# Patient Record
Sex: Female | Born: 1945 | Race: White | Hispanic: No | Smoking: Former smoker
Health system: Southern US, Community
[De-identification: ages and names within clinical notes are randomized; demographics above are authoritative.]

## PROBLEM LIST (undated history)

## (undated) DIAGNOSIS — E785 Hyperlipidemia, unspecified: Secondary | ICD-10-CM

## (undated) DIAGNOSIS — I208 Other forms of angina pectoris: Secondary | ICD-10-CM

## (undated) DIAGNOSIS — I10 Essential (primary) hypertension: Secondary | ICD-10-CM

## (undated) DIAGNOSIS — Z9089 Acquired absence of other organs: Secondary | ICD-10-CM

## (undated) DIAGNOSIS — I639 Cerebral infarction, unspecified: Secondary | ICD-10-CM

## (undated) DIAGNOSIS — M1990 Unspecified osteoarthritis, unspecified site: Secondary | ICD-10-CM

## (undated) DIAGNOSIS — J449 Chronic obstructive pulmonary disease, unspecified: Secondary | ICD-10-CM

## (undated) DIAGNOSIS — I252 Old myocardial infarction: Secondary | ICD-10-CM

## (undated) DIAGNOSIS — D249 Benign neoplasm of unspecified breast: Secondary | ICD-10-CM

## (undated) DIAGNOSIS — Z9079 Acquired absence of other genital organ(s): Secondary | ICD-10-CM

## (undated) DIAGNOSIS — K219 Gastro-esophageal reflux disease without esophagitis: Secondary | ICD-10-CM

## (undated) DIAGNOSIS — I2510 Atherosclerotic heart disease of native coronary artery without angina pectoris: Secondary | ICD-10-CM

## (undated) DIAGNOSIS — I2089 Other forms of angina pectoris: Secondary | ICD-10-CM

## (undated) HISTORY — DX: Other forms of angina pectoris: I20.89

## (undated) HISTORY — DX: Atherosclerotic heart disease of native coronary artery without angina pectoris: I25.10

## (undated) HISTORY — DX: Cerebral infarction, unspecified: I63.9

## (undated) HISTORY — PX: BREAST LUMPECTOMY: SHX2

## (undated) HISTORY — DX: Other forms of angina pectoris: I20.8

## (undated) HISTORY — DX: Old myocardial infarction: I25.2

## (undated) HISTORY — DX: Chronic obstructive pulmonary disease, unspecified: J44.9

## (undated) HISTORY — DX: Benign neoplasm of unspecified breast: D24.9

## (undated) HISTORY — DX: Acquired absence of other genital organ(s): Z90.79

## (undated) HISTORY — DX: Gastro-esophageal reflux disease without esophagitis: K21.9

## (undated) HISTORY — DX: Essential (primary) hypertension: I10

## (undated) HISTORY — PX: POLYPECTOMY: SHX149

## (undated) HISTORY — DX: Hyperlipidemia, unspecified: E78.5

## (undated) HISTORY — DX: Unspecified osteoarthritis, unspecified site: M19.90

## (undated) HISTORY — PX: COLONOSCOPY: SHX174

## (undated) HISTORY — PX: CARPAL TUNNEL RELEASE: SHX101

## (undated) HISTORY — DX: Acquired absence of other organs: Z90.89

## (undated) NOTE — *Deleted (*Deleted)
   Office Visit   Patient ID: MATALYN NAWAZ, female    DOB: Jul 23, 1945, 89 y.o.   MRN: [id number]  Subjective:  CC: ***  HPI 48 y.o. presents today for ***      ACTIVE MEDICATIONS   Current Outpatient Medications on File Prior to Visit  Medication Sig Dispense Refill  . amLODipine (NORVASC) 5 MG tablet TAKE 1 TABLET EACH DAY. 90 tablet 1  . aspirin 81 MG EC tablet Take 1 tablet (81 mg total) by mouth daily. 90 tablet 3  . ASPIRIN-ACETAMINOPHEN-CAFFEINE PO Take by mouth as needed.    Marland Kitchen atorvastatin (LIPITOR) 40 MG tablet Take 0.5 tablets (20 mg total) by mouth daily. 90 tablet 2  . atorvastatin (LIPITOR) 40 MG tablet Take 0.5 tablets (20 mg total) by mouth daily. 45 tablet 3  . B Complex-Biotin-FA (SUPER B-COMPLEX PO) Take 1 tablet by mouth daily.      . calcium-vitamin D (OSCAL 500/200 D-3) 500-200 MG-UNIT tablet Take 2 tablets by mouth daily with breakfast. 90 tablet 1  . carvedilol (COREG) 25 MG tablet Take 1 tablet (25 mg total) by mouth 2 (two) times daily with a meal. 180 tablet 1  . hydrochlorothiazide (HYDRODIURIL) 25 MG tablet TAKE 1 TABLET ONCE DAILY. 90 tablet 3  . nitroGLYCERIN (NITROSTAT) 0.4 MG SL tablet DISSOLVE 1 TABLET UNDER TONGUE AS NEEDED FOR CHEST PAIN,MAY REPEAT IN5 MINUTES FOR 3 DOSES. 25 tablet 1  . omeprazole (PRILOSEC) 40 MG capsule TAKE (1) CAPSULE TWICE DAILY. 180 capsule 0  . senna-docusate (SENNALAX-S) 8.6-50 MG tablet Take 2 tablets by mouth daily as needed (for constipation). 60 tablet 5  . [DISCONTINUED] omeprazole (PRILOSEC) 40 MG capsule Take 1 capsule (40 mg total) by mouth 2 (two) times daily. TAKE (1) CAPSULE DAILY. 180 capsule 1  . [DISCONTINUED] omeprazole (PRILOSEC) 40 MG capsule TAKE (1) CAPSULE TWICE DAILY. 180 capsule 0   No current facility-administered medications on file prior to visit.    ROS  Review of Systems  Objective:   LMP 06/20/1986  Wt Readings from Last 3 Encounters:  07/09/19 158 lb (71.7 kg)  03/25/19 157 lb  12.8 oz (71.6 kg)  11/23/18 156 lb 11.2 oz (71.1 kg)   BP Readings from Last 3 Encounters:  07/09/19 (!) 147/86  03/25/19 (!) 142/73  11/23/18 (!) 137/97   Physical Exam  Health Maintenance:   Health Maintenance  Topic Date Due  . Hepatitis C Screening  Never done  . COVID-19 Vaccine (1) Never done  . INFLUENZA VACCINE  01/19/2020  . COLONOSCOPY  02/04/2020  . MAMMOGRAM  04/07/2021  . TETANUS/TDAP  11/15/2021  . DEXA SCAN  Completed  . PNA vac Low Risk Adult  Completed     Assessment & Plan:   Problem List Items Addressed This Visit    None        Pt discussed with ***  Elige Radon, MD Internal Medicine Resident PGY-2 [facility] Pager: [phone] 02/28/2020 10:56 AM

---
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
## 1986-06-20 HISTORY — PX: ABDOMINAL HYSTERECTOMY: SHX81

## 1986-06-20 HISTORY — PX: BREAST EXCISIONAL BIOPSY: SUR124

## 1989-06-20 HISTORY — PX: SHOULDER SURGERY: SHX246

## 1995-06-21 HISTORY — PX: BLADDER SUSPENSION: SHX72

## 2000-12-09 ENCOUNTER — Emergency Department (HOSPITAL_COMMUNITY): Admission: EM | Admit: 2000-12-09 | Discharge: 2000-12-10 | Payer: Self-pay | Admitting: Emergency Medicine

## 2000-12-14 ENCOUNTER — Emergency Department (HOSPITAL_COMMUNITY): Admission: EM | Admit: 2000-12-14 | Discharge: 2000-12-14 | Payer: Self-pay | Admitting: Emergency Medicine

## 2002-04-09 ENCOUNTER — Emergency Department (HOSPITAL_COMMUNITY): Admission: EM | Admit: 2002-04-09 | Discharge: 2002-04-09 | Payer: Self-pay | Admitting: Emergency Medicine

## 2002-05-09 ENCOUNTER — Emergency Department (HOSPITAL_COMMUNITY): Admission: EM | Admit: 2002-05-09 | Discharge: 2002-05-09 | Payer: Self-pay | Admitting: Emergency Medicine

## 2002-10-23 ENCOUNTER — Emergency Department (HOSPITAL_COMMUNITY): Admission: EM | Admit: 2002-10-23 | Discharge: 2002-10-23 | Payer: Self-pay | Admitting: Emergency Medicine

## 2002-10-24 ENCOUNTER — Encounter: Payer: Self-pay | Admitting: Emergency Medicine

## 2002-10-24 ENCOUNTER — Emergency Department (HOSPITAL_COMMUNITY): Admission: EM | Admit: 2002-10-24 | Discharge: 2002-10-24 | Payer: Self-pay | Admitting: Emergency Medicine

## 2003-11-25 ENCOUNTER — Ambulatory Visit (HOSPITAL_COMMUNITY): Admission: RE | Admit: 2003-11-25 | Discharge: 2003-11-25 | Payer: Self-pay | Admitting: Family Medicine

## 2004-03-26 ENCOUNTER — Ambulatory Visit: Payer: Self-pay | Admitting: Family Medicine

## 2004-04-19 ENCOUNTER — Ambulatory Visit: Payer: Self-pay | Admitting: *Deleted

## 2004-04-19 ENCOUNTER — Ambulatory Visit: Payer: Self-pay | Admitting: Family Medicine

## 2004-08-09 ENCOUNTER — Ambulatory Visit (HOSPITAL_COMMUNITY): Admission: RE | Admit: 2004-08-09 | Discharge: 2004-08-09 | Payer: Self-pay | Admitting: Gastroenterology

## 2004-08-09 ENCOUNTER — Encounter: Payer: Self-pay | Admitting: Gastroenterology

## 2004-08-09 LAB — HM COLONOSCOPY

## 2005-06-20 DIAGNOSIS — I2510 Atherosclerotic heart disease of native coronary artery without angina pectoris: Secondary | ICD-10-CM

## 2005-06-20 HISTORY — DX: Atherosclerotic heart disease of native coronary artery without angina pectoris: I25.10

## 2005-06-20 HISTORY — PX: PTCA: SHX146

## 2005-06-27 ENCOUNTER — Ambulatory Visit: Payer: Self-pay | Admitting: Hospitalist

## 2005-07-11 ENCOUNTER — Ambulatory Visit: Payer: Self-pay | Admitting: Internal Medicine

## 2005-09-19 ENCOUNTER — Ambulatory Visit: Payer: Self-pay | Admitting: Hospitalist

## 2005-10-04 ENCOUNTER — Ambulatory Visit: Payer: Self-pay | Admitting: Gastroenterology

## 2005-10-21 ENCOUNTER — Ambulatory Visit: Payer: Self-pay | Admitting: Gastroenterology

## 2005-12-15 ENCOUNTER — Ambulatory Visit: Payer: Self-pay | Admitting: Internal Medicine

## 2006-02-15 ENCOUNTER — Ambulatory Visit: Payer: Self-pay | Admitting: Hospitalist

## 2006-02-23 ENCOUNTER — Inpatient Hospital Stay (HOSPITAL_COMMUNITY): Admission: AD | Admit: 2006-02-23 | Discharge: 2006-02-25 | Payer: Self-pay | Admitting: Internal Medicine

## 2006-02-23 ENCOUNTER — Ambulatory Visit: Payer: Self-pay | Admitting: Hospitalist

## 2006-02-24 ENCOUNTER — Ambulatory Visit: Payer: Self-pay | Admitting: Cardiology

## 2006-02-24 ENCOUNTER — Encounter: Payer: Self-pay | Admitting: Cardiology

## 2006-03-17 ENCOUNTER — Ambulatory Visit: Payer: Self-pay | Admitting: *Deleted

## 2006-03-20 ENCOUNTER — Ambulatory Visit (HOSPITAL_COMMUNITY): Admission: RE | Admit: 2006-03-20 | Discharge: 2006-03-20 | Payer: Self-pay | Admitting: Internal Medicine

## 2006-03-28 ENCOUNTER — Encounter: Admission: RE | Admit: 2006-03-28 | Discharge: 2006-03-28 | Payer: Self-pay | Admitting: Internal Medicine

## 2006-03-31 ENCOUNTER — Ambulatory Visit: Payer: Self-pay | Admitting: Internal Medicine

## 2006-03-31 ENCOUNTER — Encounter (INDEPENDENT_AMBULATORY_CARE_PROVIDER_SITE_OTHER): Payer: Self-pay | Admitting: Ophthalmology

## 2006-03-31 LAB — CONVERTED CEMR LAB
Cholesterol: 162 mg/dL (ref 0–200)
HDL: 40 mg/dL (ref >39)
LDL Cholesterol: 80 mg/dL (ref 0–99)
Total CHOL/HDL Ratio: 4.1
Triglycerides: 209 mg/dL — ABNORMAL HIGH (ref <150)
VLDL: 42 mg/dL — ABNORMAL HIGH (ref 0–40)

## 2006-05-10 ENCOUNTER — Emergency Department (HOSPITAL_COMMUNITY): Admission: EM | Admit: 2006-05-10 | Discharge: 2006-05-10 | Payer: Self-pay | Admitting: Emergency Medicine

## 2006-05-16 ENCOUNTER — Ambulatory Visit (HOSPITAL_COMMUNITY): Admission: RE | Admit: 2006-05-16 | Discharge: 2006-05-16 | Payer: Self-pay | Admitting: Internal Medicine

## 2006-05-16 ENCOUNTER — Encounter (INDEPENDENT_AMBULATORY_CARE_PROVIDER_SITE_OTHER): Payer: Self-pay | Admitting: *Deleted

## 2006-05-16 ENCOUNTER — Ambulatory Visit: Payer: Self-pay | Admitting: Internal Medicine

## 2006-05-16 LAB — CONVERTED CEMR LAB
ALT: 17 units/L (ref 0–35)
AST: 25 units/L (ref 0–37)
Albumin: 3.5 g/dL (ref 3.5–5.2)
Alkaline Phosphatase: 54 units/L (ref 39–117)
BUN: 14 mg/dL (ref 6–23)
Basophils Absolute: 0 10*3/uL (ref 0.0–0.1)
Basophils Relative: 1 % (ref 0–1)
Bilirubin Urine: NEGATIVE
CK-MB: 1.4 ng/mL (ref 0.3–4.0)
CO2: 28 meq/L (ref 19–32)
Calcium: 9.5 mg/dL (ref 8.4–10.5)
Chloride: 104 meq/L (ref 96–112)
Creatinine, Ser: 0.8 mg/dL (ref 0.40–1.20)
Eosinophils Relative: 2 % (ref 0–4)
Glucose, Bld: 94 mg/dL (ref 70–99)
HCT: 40.4 % (ref 34.4–43.3)
Hemoglobin, Urine: NEGATIVE
Hemoglobin: 13.7 g/dL (ref 11.7–14.8)
Ketones, ur: NEGATIVE mg/dL
Lipase: 26 units/L (ref 11–59)
Lymphocytes Relative: 29 % (ref 15–43)
Lymphs Abs: 2.2 10*3/uL (ref 0.8–3.1)
MCHC: 33.9 g/dL (ref 33.1–35.4)
MCV: 84.3 fL (ref 78.8–100.0)
Monocytes Absolute: 0.6 10*3/uL (ref 0.2–0.7)
Monocytes Relative: 7 % (ref 3–11)
Neutro Abs: 4.7 10*3/uL (ref 1.8–6.8)
Neutrophils Relative %: 61 % (ref 47–77)
Nitrite: NEGATIVE
Platelets: 242 10*3/uL (ref 152–374)
Potassium: 4.3 meq/L (ref 3.5–5.3)
Protein, ur: NEGATIVE mg/dL
RBC: 4.8 M/uL (ref 3.79–4.96)
RDW: 13.8 % (ref 11.5–15.3)
Sodium: 137 meq/L (ref 135–145)
Specific Gravity, Urine: 1.014 (ref 1.005–1.03)
Total Bilirubin: 0.6 mg/dL (ref 0.3–1.2)
Total CK: 70 units/L (ref 7–177)
Total Protein: 6.8 g/dL (ref 6.0–8.3)
Urine Glucose: NEGATIVE mg/dL
Urobilinogen, UA: 0.2 (ref 0.0–1.0)
WBC: 7.7 10*3/uL (ref 3.7–10.0)
pH: 6 (ref 5.0–8.0)

## 2006-05-17 ENCOUNTER — Encounter (INDEPENDENT_AMBULATORY_CARE_PROVIDER_SITE_OTHER): Payer: Self-pay | Admitting: *Deleted

## 2006-05-19 ENCOUNTER — Ambulatory Visit (HOSPITAL_COMMUNITY): Admission: RE | Admit: 2006-05-19 | Discharge: 2006-05-19 | Payer: Self-pay | Admitting: Internal Medicine

## 2006-05-22 DIAGNOSIS — K219 Gastro-esophageal reflux disease without esophagitis: Secondary | ICD-10-CM | POA: Insufficient documentation

## 2006-05-22 DIAGNOSIS — K649 Unspecified hemorrhoids: Secondary | ICD-10-CM | POA: Insufficient documentation

## 2006-05-22 DIAGNOSIS — F17200 Nicotine dependence, unspecified, uncomplicated: Secondary | ICD-10-CM | POA: Insufficient documentation

## 2006-05-22 DIAGNOSIS — Z9189 Other specified personal risk factors, not elsewhere classified: Secondary | ICD-10-CM | POA: Insufficient documentation

## 2006-05-22 DIAGNOSIS — K5900 Constipation, unspecified: Secondary | ICD-10-CM | POA: Insufficient documentation

## 2006-05-22 DIAGNOSIS — E785 Hyperlipidemia, unspecified: Secondary | ICD-10-CM | POA: Insufficient documentation

## 2006-05-22 DIAGNOSIS — I10 Essential (primary) hypertension: Secondary | ICD-10-CM | POA: Insufficient documentation

## 2006-05-25 ENCOUNTER — Ambulatory Visit: Payer: Self-pay | Admitting: Internal Medicine

## 2006-05-25 ENCOUNTER — Encounter (INDEPENDENT_AMBULATORY_CARE_PROVIDER_SITE_OTHER): Payer: Self-pay | Admitting: Internal Medicine

## 2006-05-25 LAB — CONVERTED CEMR LAB
BUN: 21 mg/dL (ref 6–23)
CO2: 28 meq/L (ref 19–32)
Calcium: 9.4 mg/dL (ref 8.4–10.5)
Chloride: 104 meq/L (ref 96–112)
Creatinine, Ser: 0.77 mg/dL (ref 0.40–1.20)
Glucose, Bld: 89 mg/dL (ref 70–99)
Potassium: 4.2 meq/L (ref 3.5–5.3)
Sodium: 142 meq/L (ref 135–145)
TSH: 1.573 microintl units/mL (ref 0.350–5.50)

## 2006-06-20 DIAGNOSIS — D249 Benign neoplasm of unspecified breast: Secondary | ICD-10-CM

## 2006-06-20 HISTORY — DX: Benign neoplasm of unspecified breast: D24.9

## 2006-06-20 HISTORY — PX: OTHER SURGICAL HISTORY: SHX169

## 2006-06-28 ENCOUNTER — Encounter (INDEPENDENT_AMBULATORY_CARE_PROVIDER_SITE_OTHER): Payer: Self-pay | Admitting: Internal Medicine

## 2006-06-28 ENCOUNTER — Ambulatory Visit: Payer: Self-pay | Admitting: Hospitalist

## 2006-06-28 LAB — CONVERTED CEMR LAB
ALT: 16 units/L (ref 0–35)
AST: 25 units/L (ref 0–37)
Albumin: 4.1 g/dL (ref 3.5–5.2)
Alkaline Phosphatase: 60 units/L (ref 39–117)
BUN: 19 mg/dL (ref 6–23)
CO2: 29 meq/L (ref 19–32)
Calcium: 9.8 mg/dL (ref 8.4–10.5)
Chloride: 103 meq/L (ref 96–112)
Creatinine, Ser: 0.76 mg/dL (ref 0.40–1.20)
Glucose, Bld: 87 mg/dL (ref 70–99)
Potassium: 4.4 meq/L (ref 3.5–5.3)
Sodium: 141 meq/L (ref 135–145)
Total Bilirubin: 0.6 mg/dL (ref 0.3–1.2)
Total Protein: 7.6 g/dL (ref 6.0–8.3)

## 2006-07-03 ENCOUNTER — Ambulatory Visit: Payer: Self-pay | Admitting: Gastroenterology

## 2006-07-10 ENCOUNTER — Ambulatory Visit: Payer: Self-pay | Admitting: Internal Medicine

## 2006-07-10 DIAGNOSIS — N6029 Fibroadenosis of unspecified breast: Secondary | ICD-10-CM | POA: Insufficient documentation

## 2006-07-10 DIAGNOSIS — I25811 Atherosclerosis of native coronary artery of transplanted heart without angina pectoris: Secondary | ICD-10-CM | POA: Insufficient documentation

## 2006-07-10 DIAGNOSIS — Z9079 Acquired absence of other genital organ(s): Secondary | ICD-10-CM | POA: Insufficient documentation

## 2006-07-10 DIAGNOSIS — K589 Irritable bowel syndrome without diarrhea: Secondary | ICD-10-CM | POA: Insufficient documentation

## 2006-07-10 DIAGNOSIS — Z9089 Acquired absence of other organs: Secondary | ICD-10-CM | POA: Insufficient documentation

## 2006-07-10 DIAGNOSIS — I208 Other forms of angina pectoris: Secondary | ICD-10-CM | POA: Insufficient documentation

## 2006-07-10 HISTORY — DX: Acquired absence of other organs: Z90.89

## 2006-07-10 HISTORY — DX: Acquired absence of other genital organ(s): Z90.79

## 2006-07-25 ENCOUNTER — Telehealth (INDEPENDENT_AMBULATORY_CARE_PROVIDER_SITE_OTHER): Payer: Self-pay | Admitting: *Deleted

## 2006-08-02 ENCOUNTER — Ambulatory Visit (HOSPITAL_COMMUNITY): Admission: RE | Admit: 2006-08-02 | Discharge: 2006-08-02 | Payer: Self-pay | Admitting: General Surgery

## 2006-08-02 ENCOUNTER — Encounter (INDEPENDENT_AMBULATORY_CARE_PROVIDER_SITE_OTHER): Payer: Self-pay | Admitting: Specialist

## 2006-09-22 ENCOUNTER — Ambulatory Visit: Payer: Self-pay | Admitting: *Deleted

## 2006-09-23 DIAGNOSIS — G43909 Migraine, unspecified, not intractable, without status migrainosus: Secondary | ICD-10-CM | POA: Insufficient documentation

## 2006-09-27 ENCOUNTER — Encounter: Admission: RE | Admit: 2006-09-27 | Discharge: 2006-09-27 | Payer: Self-pay | Admitting: Internal Medicine

## 2006-10-23 ENCOUNTER — Encounter (INDEPENDENT_AMBULATORY_CARE_PROVIDER_SITE_OTHER): Payer: Self-pay | Admitting: Internal Medicine

## 2006-10-23 ENCOUNTER — Ambulatory Visit: Payer: Self-pay | Admitting: *Deleted

## 2006-10-23 LAB — CONVERTED CEMR LAB: Pap Smear: NORMAL

## 2006-10-26 ENCOUNTER — Encounter (INDEPENDENT_AMBULATORY_CARE_PROVIDER_SITE_OTHER): Payer: Self-pay | Admitting: Internal Medicine

## 2007-01-07 HISTORY — PX: RECTAL SURGERY: SHX760

## 2007-01-10 ENCOUNTER — Encounter (INDEPENDENT_AMBULATORY_CARE_PROVIDER_SITE_OTHER): Payer: Self-pay | Admitting: General Surgery

## 2007-01-10 ENCOUNTER — Ambulatory Visit (HOSPITAL_COMMUNITY): Admission: RE | Admit: 2007-01-10 | Discharge: 2007-01-10 | Payer: Self-pay | Admitting: General Surgery

## 2007-03-15 ENCOUNTER — Ambulatory Visit: Payer: Self-pay | Admitting: Internal Medicine

## 2007-03-15 ENCOUNTER — Encounter (INDEPENDENT_AMBULATORY_CARE_PROVIDER_SITE_OTHER): Payer: Self-pay | Admitting: Internal Medicine

## 2007-03-15 DIAGNOSIS — R5383 Other fatigue: Secondary | ICD-10-CM

## 2007-03-15 DIAGNOSIS — R5381 Other malaise: Secondary | ICD-10-CM | POA: Insufficient documentation

## 2007-03-16 LAB — CONVERTED CEMR LAB
ALT: 10 units/L (ref 0–35)
AST: 19 units/L (ref 0–37)
Albumin: 4.1 g/dL (ref 3.5–5.2)
Alkaline Phosphatase: 63 units/L (ref 39–117)
BUN: 17 mg/dL (ref 6–23)
Basophils Absolute: 0.1 10*3/uL (ref 0.0–0.1)
Basophils Relative: 1 % (ref 0–1)
CO2: 24 meq/L (ref 19–32)
Calcium: 9.3 mg/dL (ref 8.4–10.5)
Chloride: 106 meq/L (ref 96–112)
Creatinine, Ser: 0.84 mg/dL (ref 0.40–1.20)
Eosinophils Absolute: 0.2 10*3/uL (ref 0.0–0.7)
Eosinophils Relative: 3 % (ref 0–5)
Glucose, Bld: 90 mg/dL (ref 70–99)
HCT: 40.7 % (ref 36.0–46.0)
Hemoglobin: 12.9 g/dL (ref 12.0–15.0)
Lymphocytes Relative: 35 % (ref 12–46)
Lymphs Abs: 2.2 10*3/uL (ref 0.7–3.3)
MCHC: 31.7 g/dL (ref 30.0–36.0)
MCV: 82.7 fL (ref 78.0–100.0)
Monocytes Absolute: 0.4 10*3/uL (ref 0.2–0.7)
Monocytes Relative: 7 % (ref 3–11)
Neutro Abs: 3.3 10*3/uL (ref 1.7–7.7)
Neutrophils Relative %: 53 % (ref 43–77)
Platelets: 241 10*3/uL (ref 150–400)
Potassium: 4.3 meq/L (ref 3.5–5.3)
RBC: 4.92 M/uL (ref 3.87–5.11)
RDW: 14.6 % — ABNORMAL HIGH (ref 11.5–14.0)
Sodium: 140 meq/L (ref 135–145)
TSH: 1.317 microintl units/mL (ref 0.350–5.50)
Total Bilirubin: 0.4 mg/dL (ref 0.3–1.2)
Total Protein: 7.2 g/dL (ref 6.0–8.3)
WBC: 6.3 10*3/uL (ref 4.0–10.5)

## 2007-03-23 ENCOUNTER — Encounter: Admission: RE | Admit: 2007-03-23 | Discharge: 2007-03-23 | Payer: Self-pay | Admitting: Sports Medicine

## 2007-03-30 ENCOUNTER — Telehealth: Payer: Self-pay | Admitting: *Deleted

## 2007-05-14 ENCOUNTER — Ambulatory Visit: Payer: Self-pay | Admitting: Internal Medicine

## 2007-07-02 ENCOUNTER — Telehealth: Payer: Self-pay | Admitting: *Deleted

## 2007-08-06 ENCOUNTER — Telehealth: Payer: Self-pay | Admitting: *Deleted

## 2007-08-28 ENCOUNTER — Ambulatory Visit: Payer: Self-pay | Admitting: Internal Medicine

## 2007-08-28 ENCOUNTER — Observation Stay (HOSPITAL_COMMUNITY): Admission: AD | Admit: 2007-08-28 | Discharge: 2007-08-29 | Payer: Self-pay | Admitting: Internal Medicine

## 2007-08-28 ENCOUNTER — Ambulatory Visit: Payer: Self-pay | Admitting: Hospitalist

## 2007-08-28 ENCOUNTER — Ambulatory Visit: Payer: Self-pay | Admitting: Cardiovascular Disease

## 2007-08-28 ENCOUNTER — Encounter: Admission: RE | Admit: 2007-08-28 | Discharge: 2007-08-28 | Payer: Self-pay | Admitting: Hospitalist

## 2007-08-28 DIAGNOSIS — J4489 Other specified chronic obstructive pulmonary disease: Secondary | ICD-10-CM | POA: Insufficient documentation

## 2007-08-28 DIAGNOSIS — J449 Chronic obstructive pulmonary disease, unspecified: Secondary | ICD-10-CM | POA: Insufficient documentation

## 2007-08-29 ENCOUNTER — Encounter (INDEPENDENT_AMBULATORY_CARE_PROVIDER_SITE_OTHER): Payer: Self-pay | Admitting: Internal Medicine

## 2007-08-31 ENCOUNTER — Ambulatory Visit: Payer: Self-pay | Admitting: Internal Medicine

## 2007-09-06 ENCOUNTER — Ambulatory Visit: Payer: Self-pay

## 2007-09-06 ENCOUNTER — Encounter (INDEPENDENT_AMBULATORY_CARE_PROVIDER_SITE_OTHER): Payer: Self-pay | Admitting: Internal Medicine

## 2007-12-07 ENCOUNTER — Ambulatory Visit: Payer: Self-pay | Admitting: Internal Medicine

## 2007-12-07 ENCOUNTER — Encounter (INDEPENDENT_AMBULATORY_CARE_PROVIDER_SITE_OTHER): Payer: Self-pay | Admitting: Internal Medicine

## 2008-01-14 ENCOUNTER — Ambulatory Visit: Payer: Self-pay | Admitting: Internal Medicine

## 2008-01-14 ENCOUNTER — Encounter: Payer: Self-pay | Admitting: Licensed Clinical Social Worker

## 2008-01-22 LAB — CONVERTED CEMR LAB
Cholesterol: 238 mg/dL — ABNORMAL HIGH (ref 0–200)
HDL: 47 mg/dL (ref >39)
LDL Cholesterol: 135 mg/dL — ABNORMAL HIGH (ref 0–99)
Total CHOL/HDL Ratio: 5.1
Triglycerides: 279 mg/dL — ABNORMAL HIGH (ref <150)
VLDL: 56 mg/dL — ABNORMAL HIGH (ref 0–40)

## 2008-04-08 ENCOUNTER — Encounter: Admission: RE | Admit: 2008-04-08 | Discharge: 2008-04-08 | Payer: Self-pay | Admitting: Internal Medicine

## 2008-04-14 ENCOUNTER — Ambulatory Visit: Payer: Self-pay | Admitting: Internal Medicine

## 2008-04-16 LAB — CONVERTED CEMR LAB
Cholesterol: 178 mg/dL (ref 0–200)
HDL: 47 mg/dL (ref >39)
LDL Cholesterol: 111 mg/dL — ABNORMAL HIGH (ref 0–99)
Total CHOL/HDL Ratio: 3.8
Triglycerides: 101 mg/dL (ref <150)
VLDL: 20 mg/dL (ref 0–40)

## 2008-05-28 ENCOUNTER — Telehealth (INDEPENDENT_AMBULATORY_CARE_PROVIDER_SITE_OTHER): Payer: Self-pay | Admitting: Internal Medicine

## 2008-07-10 ENCOUNTER — Ambulatory Visit: Payer: Self-pay | Admitting: Internal Medicine

## 2008-07-10 LAB — CONVERTED CEMR LAB
Cholesterol: 165 mg/dL (ref 0–200)
HDL: 54 mg/dL (ref >39)
LDL Cholesterol: 88 mg/dL (ref 0–99)
Total CHOL/HDL Ratio: 3.1
Triglycerides: 114 mg/dL (ref <150)
VLDL: 23 mg/dL (ref 0–40)

## 2008-08-01 ENCOUNTER — Telehealth: Payer: Self-pay | Admitting: *Deleted

## 2008-08-25 ENCOUNTER — Telehealth (INDEPENDENT_AMBULATORY_CARE_PROVIDER_SITE_OTHER): Payer: Self-pay | Admitting: Internal Medicine

## 2008-09-02 ENCOUNTER — Ambulatory Visit: Payer: Self-pay | Admitting: Gastroenterology

## 2008-09-02 DIAGNOSIS — K921 Melena: Secondary | ICD-10-CM | POA: Insufficient documentation

## 2008-09-02 DIAGNOSIS — R933 Abnormal findings on diagnostic imaging of other parts of digestive tract: Secondary | ICD-10-CM | POA: Insufficient documentation

## 2008-09-03 ENCOUNTER — Telehealth: Payer: Self-pay | Admitting: Gastroenterology

## 2008-09-03 LAB — CONVERTED CEMR LAB
Basophils Absolute: 0.1 10*3/uL (ref 0.0–0.1)
Basophils Relative: 0.7 % (ref 0.0–3.0)
Eosinophils Absolute: 0.3 10*3/uL (ref 0.0–0.7)
Eosinophils Relative: 3.1 % (ref 0.0–5.0)
HCT: 39.7 % (ref 36.0–46.0)
Hemoglobin: 13.6 g/dL (ref 12.0–15.0)
Lymphocytes Relative: 25.9 % (ref 12.0–46.0)
Lymphs Abs: 2.3 10*3/uL (ref 0.7–4.0)
MCHC: 34.2 g/dL (ref 30.0–36.0)
MCV: 81.9 fL (ref 78.0–100.0)
Monocytes Absolute: 0.5 10*3/uL (ref 0.1–1.0)
Monocytes Relative: 5.7 % (ref 3.0–12.0)
Neutro Abs: 5.7 10*3/uL (ref 1.4–7.7)
Neutrophils Relative %: 64.6 % (ref 43.0–77.0)
Platelets: 234 10*3/uL (ref 150.0–400.0)
RBC: 4.85 M/uL (ref 3.87–5.11)
RDW: 13.8 % (ref 11.5–14.6)
WBC: 8.9 10*3/uL (ref 4.5–10.5)

## 2008-09-23 ENCOUNTER — Telehealth (INDEPENDENT_AMBULATORY_CARE_PROVIDER_SITE_OTHER): Payer: Self-pay | Admitting: Internal Medicine

## 2008-10-02 ENCOUNTER — Encounter: Payer: Self-pay | Admitting: Gastroenterology

## 2008-12-01 ENCOUNTER — Telehealth (INDEPENDENT_AMBULATORY_CARE_PROVIDER_SITE_OTHER): Payer: Self-pay | Admitting: Internal Medicine

## 2009-01-14 ENCOUNTER — Encounter: Payer: Self-pay | Admitting: Internal Medicine

## 2009-01-14 ENCOUNTER — Ambulatory Visit: Payer: Self-pay | Admitting: Internal Medicine

## 2009-01-14 LAB — CONVERTED CEMR LAB
ALT: 18 units/L (ref 0–35)
AST: 28 units/L (ref 0–37)
Albumin: 4.3 g/dL (ref 3.5–5.2)
Alkaline Phosphatase: 63 units/L (ref 39–117)
BUN: 14 mg/dL (ref 6–23)
CO2: 26 meq/L (ref 19–32)
Calcium: 9.9 mg/dL (ref 8.4–10.5)
Chloride: 104 meq/L (ref 96–112)
Creatinine, Ser: 0.75 mg/dL (ref 0.40–1.20)
Glucose, Bld: 88 mg/dL (ref 70–99)
Potassium: 4.4 meq/L (ref 3.5–5.3)
Sodium: 143 meq/L (ref 135–145)
Total Bilirubin: 0.5 mg/dL (ref 0.3–1.2)
Total Protein: 7.6 g/dL (ref 6.0–8.3)

## 2009-02-25 ENCOUNTER — Telehealth (INDEPENDENT_AMBULATORY_CARE_PROVIDER_SITE_OTHER): Payer: Self-pay | Admitting: *Deleted

## 2009-04-14 ENCOUNTER — Telehealth: Payer: Self-pay | Admitting: *Deleted

## 2009-07-03 ENCOUNTER — Telehealth: Payer: Self-pay | Admitting: Internal Medicine

## 2009-07-24 ENCOUNTER — Ambulatory Visit: Payer: Self-pay | Admitting: Internal Medicine

## 2009-07-24 LAB — CONVERTED CEMR LAB
BUN: 13 mg/dL (ref 6–23)
CO2: 26 meq/L (ref 19–32)
Calcium: 9.3 mg/dL (ref 8.4–10.5)
Chloride: 108 meq/L (ref 96–112)
Cholesterol: 195 mg/dL (ref 0–200)
Creatinine, Ser: 0.76 mg/dL (ref 0.40–1.20)
Glucose, Bld: 90 mg/dL (ref 70–99)
HDL: 39 mg/dL — ABNORMAL LOW (ref >39)
LDL Cholesterol: 102 mg/dL — ABNORMAL HIGH (ref 0–99)
Potassium: 4.9 meq/L (ref 3.5–5.3)
Sodium: 142 meq/L (ref 135–145)
Total CHOL/HDL Ratio: 5
Triglycerides: 268 mg/dL — ABNORMAL HIGH (ref <150)
VLDL: 54 mg/dL — ABNORMAL HIGH (ref 0–40)

## 2009-07-30 ENCOUNTER — Encounter: Admission: RE | Admit: 2009-07-30 | Discharge: 2009-07-30 | Payer: Self-pay | Admitting: Internal Medicine

## 2009-07-30 LAB — HM MAMMOGRAPHY: HM Mammogram: NEGATIVE

## 2009-10-07 ENCOUNTER — Ambulatory Visit: Payer: Self-pay | Admitting: Internal Medicine

## 2009-10-07 DIAGNOSIS — H9209 Otalgia, unspecified ear: Secondary | ICD-10-CM | POA: Insufficient documentation

## 2009-10-07 DIAGNOSIS — H9319 Tinnitus, unspecified ear: Secondary | ICD-10-CM | POA: Insufficient documentation

## 2009-10-14 ENCOUNTER — Encounter: Payer: Self-pay | Admitting: Internal Medicine

## 2009-10-16 ENCOUNTER — Telehealth: Payer: Self-pay | Admitting: Internal Medicine

## 2009-12-17 ENCOUNTER — Encounter: Admission: RE | Admit: 2009-12-17 | Discharge: 2009-12-18 | Payer: Self-pay | Admitting: Internal Medicine

## 2009-12-17 ENCOUNTER — Encounter: Payer: Self-pay | Admitting: Internal Medicine

## 2009-12-24 ENCOUNTER — Ambulatory Visit: Payer: Self-pay | Admitting: Internal Medicine

## 2009-12-24 LAB — CONVERTED CEMR LAB
BUN: 17 mg/dL (ref 6–23)
CO2: 27 meq/L (ref 19–32)
Calcium: 9.8 mg/dL (ref 8.4–10.5)
Chloride: 103 meq/L (ref 96–112)
Creatinine, Ser: 0.71 mg/dL (ref 0.40–1.20)
Glucose, Bld: 88 mg/dL (ref 70–99)
Potassium: 4.3 meq/L (ref 3.5–5.3)
Sodium: 140 meq/L (ref 135–145)

## 2010-01-28 ENCOUNTER — Ambulatory Visit (HOSPITAL_COMMUNITY): Admission: RE | Admit: 2010-01-28 | Discharge: 2010-01-28 | Payer: Self-pay | Admitting: Internal Medicine

## 2010-01-28 ENCOUNTER — Ambulatory Visit: Payer: Self-pay | Admitting: Internal Medicine

## 2010-01-28 DIAGNOSIS — R0602 Shortness of breath: Secondary | ICD-10-CM | POA: Insufficient documentation

## 2010-01-29 ENCOUNTER — Telehealth: Payer: Self-pay | Admitting: *Deleted

## 2010-02-09 ENCOUNTER — Encounter: Payer: Self-pay | Admitting: Internal Medicine

## 2010-05-31 ENCOUNTER — Ambulatory Visit: Payer: Self-pay

## 2010-06-24 ENCOUNTER — Ambulatory Visit (HOSPITAL_COMMUNITY): Admission: RE | Admit: 2010-06-24 | Payer: Self-pay | Source: Home / Self Care | Admitting: Internal Medicine

## 2010-06-29 ENCOUNTER — Ambulatory Visit: Admit: 2010-06-29 | Payer: Self-pay

## 2010-07-08 ENCOUNTER — Other Ambulatory Visit: Payer: Self-pay | Admitting: Internal Medicine

## 2010-07-08 DIAGNOSIS — Z1231 Encounter for screening mammogram for malignant neoplasm of breast: Secondary | ICD-10-CM

## 2010-07-08 DIAGNOSIS — Z139 Encounter for screening, unspecified: Secondary | ICD-10-CM

## 2010-07-11 ENCOUNTER — Encounter: Payer: Self-pay | Admitting: Internal Medicine

## 2010-07-20 NOTE — Procedures (Signed)
Summary: EGD   EGD  Procedure date:  10/21/2005  Findings:      Location: [facility]    EGD  Procedure date:  10/21/2005  Findings:      Location: [facility]    Patient Name: Alexis Kline, Alexis Kline MRN:  Procedure Procedures: Panendoscopy (EGD) CPT: 43235.  Personnel: Endoscopist: Rachael Fee, MD.  Exam Location: Exam performed in Outpatient Clinic. Outpatient  Patient Consent: Procedure, Alternatives, Risks and Benefits discussed, consent obtained, from patient. Consent was obtained by the RN.  Indications Symptoms: Dyspepsia, Reflux symptoms  History  Current Medications: Patient is not currently taking Coumadin.  Pre-Exam Physical: Performed Oct 21, 2005  Cardio-pulmonary exam, Abdominal exam, Mental status exam WNL.  Comments: Pt. history reviewed/updated, physical exam performed prior to initiation of sedation? yes Exam Exam Info: Maximum depth of insertion Duodenum, intended Duodenum. Patient position: on left side. Gastric retroflexion performed. Images taken. ASA Classification: II. Tolerance: good.  Sedation Meds: Patient assessed and found to be appropriate for moderate (conscious) sedation. Fentanyl 50 mcg. given IV. Versed 7 mg. given IV. Cetacaine Spray 1 sprays given aerosolized.  Monitoring: BP and pulse monitoring done. Oximetry used. Supplemental O2 given  Findings - Normal: Proximal Esophagus to Duodenal 2nd Portion.   Assessment Normal examination.  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 Comments: She should continue PPI (prevacid) daily.  Her constipation is still bothersome while taking one capful of Miralax daily and so she will double this. Events  Unplanned Intervention: No unplanned interventions were required.  Unplanned Events: There were no complications. Plans Comments: as above.  Will arrange for return office visit in 3-4 months. Disposition: After procedure patient sent  to recovery. After recovery patient sent home.  Comments: rov 3-4 months. This report was created from the original endoscopy report, which was reviewed and signed by the above listed endoscopist.

## 2010-07-20 NOTE — Assessment & Plan Note (Signed)
Summary: EST-CK.MEDS/CFB   Vital Signs:  Patient profile:   65 year old female Height:      62 inches (157.48 cm) Weight:      155.9 pounds (70.86 kg) BMI:     28.62 Temp:     97.0 degrees F (36.11 degrees C) oral Pulse rate:   70 / minute BP sitting:   150 / 91  (left arm)  Vitals Entered By: Blenda Mounts (July 24, 2009 10:32 AM)  Serial Vital Signs/Assessments:  Time      Position  BP       Pulse  Resp  Temp     By 10:48 AM            163/100                        Shapale Watlington  CC: nausea, diarrhea,  medication refill, check up Is Patient Diabetic? No Pain Assessment Patient in pain? no      Nutritional Status BMI of 25 - 29 = overweight  Have you ever been in a relationship where you felt threatened, hurt or afraid?No   Does patient need assistance? Functional Status Self care Ambulation Normal   Primary Care Provider:  Chauncey Reading, DO  CC:  nausea, diarrhea, medication refill, and check up.  History of Present Illness: 65 y/o female with PMH of:CAD, HTN, Hyperlipidemia, and GERD and HA.  Patient presents to [facility] for general check up. Does not report any complaints or concerns today.  ROS otherwise negative.  Pt needs refill on medications.      Preventive Screening-Counseling & Management  Alcohol-Tobacco     Alcohol drinks/day: rarely     Alcohol type: brandy     Smoking Status: current     Smoking Cessation Counseling: yes     Packs/Day: 2 cigs/day     Year Started: ABOUT THE AGE OF 10     Year Quit: AUGUST 2009  Caffeine-Diet-Exercise     Caffeine use/day: 2-3 coffee day     Does Patient Exercise: yes     Type of exercise: WALKING- stairs     Times/week: 7  Current Medications (verified): 1)  Hydrochlorothiazide 25 Mg Tabs (Hydrochlorothiazide) .... Take 1 Tablet By Mouth Once A Day 2)  Pravastatin Sodium 40 Mg  Tabs (Pravastatin Sodium) .... Take 1 Tablet By Mouth At Bedtime 3)  Nitroglycerin 0.4 Mg Subl (Nitroglycerin)  .... Place 1 Tablet Under The Tongue Every 5 Minutes Up To 3 Times As Needed For Chest Pain 4)  Aspirin 81 Mg Tbec (Aspirin) .... Take 1 Tablet By Mouth Once A Day 5)  Hyoscyamine Sulfate 0.125 Mg/ml Soln (Hyoscyamine Sulfate) .... Place 1 Drop On Tongue Up To Ever 6 Hours As Needed For Bloating 6)  Cafergot 1-100 Mg Tabs (Ergotamine-Caffeine) .... Take 2 Tablets At Onset of Headache and Then 1 Tablet Up To Every 30 Minutes As Needed.  Max of 6 Tablets Per Day or 10 Tablets Per Week. 7)  Carvedilol 12.5 Mg Tabs (Carvedilol) .... Take 1 Tablet By Mouth Two Times A Day For Your Blood Pressure 8)  Ventolin Hfa 108 (90 Base) Mcg/act Aers (Albuterol Sulfate) .... Take 1-2 Puffs Up To Every 4 Hours As Needed For Shortness of Breath, Cough or Wheezing 9)  Vicodin 5-500 Mg Tabs (Hydrocodone-Acetaminophen) .... As Needed 10)  Omeprazole 40 Mg Cpdr (Omeprazole) .... Take 1 Tablet By Mouth Once A Day For Acid Reflux  Allergies (verified): 1)  ! *  Tape 2)  ! 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  Coronary artery disease, nonobstructive, 30% LAD 9/07 cath Irritable bowel syndrome Atypical CP - neg stress test 2009  Past Surgical History: Last updated: 10/23/2006 Appendectomy Hysterectomy Percutaneous transluminal coronary angioplasty Lumpectomy-Right breast areola  Family History: Last updated: 01/14/2008 Family History High cholesterol all sibilings and parents parents, oldest brother died with heart attack older sister had triple bypass one sister died of stomach cancer at age of 27  Social History: Last updated: 07/24/2009 lives alone, does volunteer work  Current Smoker - smokes 1-2 cigarettes daily  Alcohol  use-no Drug use-no Regular exercise-yes  Risk Factors: Alcohol Use: rarely (07/24/2009) Caffeine Use: 2-3 coffee day (07/24/2009) Exercise: yes (07/24/2009)  Risk Factors: Smoking Status: current (07/24/2009) Packs/Day: 2 cigs/day (07/24/2009)  Social History: lives alone, does volunteer work  Current Smoker - smokes 1-2 cigarettes daily  Alcohol use-no Drug use-no Regular exercise-yes Packs/Day:  2 cigs/day Caffeine use/day:  2-3 coffee day  Review of Systems CV:  Denies chest pain or discomfort, fainting, fatigue, leg cramps with exertion, shortness of breath with exertion, and swelling of hands. Resp:  Denies chest pain with inspiration and cough. GI:  Denies abdominal pain. GU:  Denies urinary frequency and urinary hesitancy. MS:  Denies joint pain. Neuro:  Complains of headaches.  Physical Exam  General:  alert and well-developed.   Head:  normocephalic and atraumatic.   Eyes:  vision grossly intact, pupils equal, pupils round, and pupils reactive to light.   Mouth:  no dental plaque and pharynx pink and moist.   Neck:  supple, full ROM, and no masses.   Lungs:  normal respiratory effort, no intercostal retractions, no accessory muscle use, normal breath sounds, no dullness, no fremitus, no crackles, and no wheezes.   Heart:  normal rate, regular rhythm, no murmur, no gallop, and no rub.   Abdomen:  soft, non-tender, and normal bowel sounds.   Msk:  normal ROM.   Pulses:  R radial normal and L radial normal.   Extremities:  no lower extremity edema noted on exam  Neurologic:  alert & oriented X3 and cranial nerves II-XII intact.   Psych:  normally interactive and good eye contact.     Impression & Recommendations:  Problem # 1:  HYPERTENSION (ICD-401.9) Assessment Deteriorated Blood pressure elevated today. BP in past has been well controlled. Pt reports her blood pressure when checked at home is variable, ranging in 110-150s range. I asked patient to come back  in 1 month for BP recheck. If BP remains high, will reassess her current anti-hypertensive regimen. I have also asked patient to check blood pressure daily and document pressures in a journal and bring to next appointment. Will also check BMet for renal function.   Her updated medication list for this problem includes:    Hydrochlorothiazide 25 Mg Tabs (Hydrochlorothiazide) .Marland Kitchen... Take 1 tablet by mouth once a day    Carvedilol 12.5 Mg Tabs (Carvedilol) .Marland Kitchen... Take 1 tablet by mouth two times a day for your blood pressure  BP today: 150/91 Prior BP: 115/76 (01/14/2009)  Labs Reviewed: K+: 4.4 (01/14/2009) Creat: : 0.75 (01/14/2009)   Chol: 165 (07/10/2008)   HDL: 54 (07/10/2008)   LDL: 88 (07/10/2008)   TG: 114 (07/10/2008)  Orders: T-Basic Metabolic Panel (  [id number]) T-Lipid Profile [phone])  Problem # 2:  MIGRAINE HEADACHE (ICD-346.90) Assessment: Comment Only Pt reports she does not use Cafergot due to price. She reports using Aleve and most of the time it relieves pain, however she occasionally uses Vicodin to control pain. Pt received refill on July 03, 2009 and has used 1 pill as noted from her pill bottle that she brought to clinic. I do not suspect abuse, and will refill her prescription at this time.   Her updated medication list for this problem includes:    Aspirin 81 Mg Tbec (Aspirin) .Marland Kitchen... Take 1 tablet by mouth once a day    Cafergot 1-100 Mg Tabs (Ergotamine-caffeine) .Marland Kitchen... Take 2 tablets at onset of headache and then 1 tablet up to every 30 minutes as needed.  max of 6 tablets per day or 10 tablets per week.    Carvedilol 12.5 Mg Tabs (Carvedilol) .Marland Kitchen... Take 1 tablet by mouth two times a day for your blood pressure    Vicodin 5-500 Mg Tabs (Hydrocodone-acetaminophen) .Marland Kitchen... As needed  Problem # 3:  HYPERLIPIDEMIA (ICD-272.4) Assessment: Comment Only Pt reports d/c Gemfibrozil due to extreme muscle aches and pains. She reports she has been tolerating Pravastatin  well. I will continue current regimen and recheck Lipid panel today.   The following medications were removed from the medication list:    Gemfibrozil 600 Mg Tabs (Gemfibrozil) .Marland Kitchen... Take 1 tablet by mouth two times a day for your cholesterol Her updated medication list for this problem includes:    Pravastatin Sodium 40 Mg Tabs (Pravastatin sodium) .Marland Kitchen... Take 1 tablet by mouth at bedtime  Orders: T-Lipid Profile [phone])  Labs Reviewed: SGOT: 28 (01/14/2009)   SGPT: 18 (01/14/2009)   HDL:54 (07/10/2008), 47 (04/14/2008)  LDL:88 (07/10/2008), 111 (18/84/1660)  Chol:165 (07/10/2008), 178 (04/14/2008)  Trig:114 (07/10/2008), 101 (04/14/2008)  Problem # 4:  CORONARY ARTERY DISEASE (ICD-414.00) No recent chest pain. Pt has not seen cardiologist recently. She said that she does not routinely see the cardiologist, and is now at as needed basis. I will refill meds and check BMet and Lipid panel today.   Her updated medication list for this problem includes:    Hydrochlorothiazide 25 Mg Tabs (Hydrochlorothiazide) .Marland Kitchen... Take 1 tablet by mouth once a day    Nitroglycerin 0.4 Mg Subl (Nitroglycerin) .Marland Kitchen... Place 1 tablet under the tongue every 5 minutes up to 3 times as needed for chest pain    Aspirin 81 Mg Tbec (Aspirin) .Marland Kitchen... Take 1 tablet by mouth once a day    Carvedilol 12.5 Mg Tabs (Carvedilol) .Marland Kitchen... Take 1 tablet by mouth two times a day for your blood pressure  Complete Medication List: 1)  Hydrochlorothiazide 25 Mg Tabs (Hydrochlorothiazide) .... Take 1 tablet by mouth once a day 2)  Pravastatin Sodium 40 Mg Tabs (Pravastatin sodium) .... Take 1 tablet by mouth at bedtime 3)  Nitroglycerin 0.4 Mg Subl (Nitroglycerin) .... Place 1 tablet under the tongue every 5 minutes up to 3 times as needed for chest pain 4)  Aspirin 81 Mg Tbec (Aspirin) .... Take 1 tablet by mouth once a day 5)  Hyoscyamine Sulfate 0.125 Mg/ml Soln (Hyoscyamine sulfate) .... Place 1 drop on tongue up to ever 6 hours  as needed for bloating 6)  Cafergot 1-100 Mg Tabs (Ergotamine-caffeine) .... Take 2 tablets at onset of headache and then 1 tablet up to every 30 minutes as needed.  max of 6 tablets per day or 10 tablets per week. 7)  Carvedilol  12.5 Mg Tabs (Carvedilol) .... Take 1 tablet by mouth two times a day for your blood pressure 8)  Ventolin Hfa 108 (90 Base) Mcg/act Aers (Albuterol sulfate) .... Take 1-2 puffs up to every 4 hours as needed for shortness of breath, cough or wheezing 9)  Vicodin 5-500 Mg Tabs (Hydrocodone-acetaminophen) .... As needed 10)  Omeprazole 40 Mg Cpdr (Omeprazole) .... Take 1 tablet by mouth once a day for acid reflux  Other Orders: Mammogram (Screening) (Mammo)  Patient Instructions: 1)  Please schedule a follow-up appointment in 1 month. 2)  Check your Blood Pressure regularly. If it is above: you should make an appointment. Please check in the morning and evening and document in a journal and bring to your next appointment.  3)  Stop Smoking Tips: Choose a Quit date. Cut down before the Quit date. decide what you will do as a substitute when you feel the urge to smoke(gum,toothpick,exercise). Prescriptions: OMEPRAZOLE 40 MG CPDR (OMEPRAZOLE) Take 1 tablet by mouth once a day for acid reflux  #30 x 11   Entered and Authorized by:   Melida Quitter MD   Signed by:   Melida Quitter MD on 07/24/2009   Method used:   Print then Give to Patient   RxID:   [id number] VICODIN 5-500 MG TABS (HYDROCODONE-ACETAMINOPHEN) as needed  #30 x 0   Entered and Authorized by:   Melida Quitter MD   Signed by:   Melida Quitter MD on 07/24/2009   Method used:   Print then Give to Patient   RxID:   [id number] CARVEDILOL 12.5 MG TABS (CARVEDILOL) Take 1 tablet by mouth two times a day for your blood pressure  #60 x 5   Entered and Authorized by:   Melida Quitter MD   Signed by:   Melida Quitter MD on 07/24/2009   Method used:   Print then Give to Patient   RxID:    [id number] ASPIRIN 81 MG TBEC (ASPIRIN) Take 1 tablet by mouth once a day  #30 x 11   Entered and Authorized by:   Melida Quitter MD   Signed by:   Melida Quitter MD on 07/24/2009   Method used:   Print then Give to Patient   RxID:   [id number] PRAVASTATIN SODIUM 40 MG  TABS (PRAVASTATIN SODIUM) Take 1 tablet by mouth at bedtime  #30 x 5   Entered and Authorized by:   Melida Quitter MD   Signed by:   Melida Quitter MD on 07/24/2009   Method used:   Print then Give to Patient   RxID:   [id number] HYDROCHLOROTHIAZIDE 25 MG TABS (HYDROCHLOROTHIAZIDE) Take 1 tablet by mouth once a day  #30 x 5   Entered and Authorized by:   Melida Quitter MD   Signed by:   Melida Quitter MD on 07/24/2009   Method used:   Print then Give to Patient   RxID:   [id number]   Prevention & Chronic Care Immunizations   Influenza vaccine: Fluvax 3+  (04/14/2008)   Influenza vaccine deferral: Refused  (07/24/2009)    Tetanus booster: Not documented    Pneumococcal vaccine: Not documented    H. zoster vaccine: Not documented  Colorectal Screening   Hemoccult: Not documented    Colonoscopy: Done by Dr. Rob Bunting (08/09/2004) no polyps, masses or diverticular disease noted repeat in 10 years  (08/09/2004)  Other Screening   Pap smear: Normal  (10/23/2006)   Pap smear action/deferral: Not indicated S/P hysterectomy  (01/14/2009)  Mammogram: ASSESSMENT: Negative - BI-RADS 1^MM DIGITAL SCREENING  (04/08/2008)   Mammogram action/deferral: Ordered  (07/24/2009)   Mammogram due: 04/2009    DXA bone density scan: Not documented   Smoking status: current  (07/24/2009)   Smoking cessation counseling: yes  (07/24/2009)  Lipids   Total Cholesterol: 165  (07/10/2008)   LDL: 88  (07/10/2008)   LDL Direct: Not documented   HDL: 54  (07/10/2008)   Triglycerides: 114  (07/10/2008)    SGOT (AST): 28  (01/14/2009)   SGPT (ALT): 18  (01/14/2009)   Alkaline phosphatase: 63   (01/14/2009)   Total bilirubin: 0.5  (01/14/2009)    Lipid flowsheet reviewed?: Yes   Progress toward LDL goal: Improved  Hypertension   Last Blood Pressure: 150 / 91  (07/24/2009)   Serum creatinine: 0.75  (01/14/2009)   BMP action: Ordered   Serum potassium 4.4  (01/14/2009)    Hypertension flowsheet reviewed?: Yes   Progress toward BP goal: Deteriorated  Self-Management Support :   Personal Goals (by the next clinic visit) :      Personal blood pressure goal: 130/80  (07/24/2009)     Personal LDL goal: 70  (07/24/2009)    Patient will work on the following items until the next clinic visit to reach self-care goals:     Eating: drink diet soda or water instead of juice or soda, eat more vegetables, use fresh or frozen vegetables, eat foods that are low in salt, eat baked foods instead of fried foods, eat fruit for snacks and desserts, limit or avoid alcohol  (07/24/2009)    Hypertension self-management support: BP self-monitoring log, Written self-care plan  (07/24/2009)   Hypertension self-care plan printed.    Lipid self-management support: Written self-care plan  (07/24/2009)   Lipid self-care plan printed.   Nursing Instructions: Schedule screening mammogram (see order)   Process Orders Check Orders Results:     [facility]: Check successful Tests Sent for requisitioning (July 24, 2009 12:23 PM):     07/24/2009: [facility] -- T-Basic Metabolic Panel [id number] (signed)     07/24/2009: [facility] -- T-Lipid Profile [phone] (signed)    Process Orders Check Orders Results:     [facility]: Check successful Tests Sent for requisitioning (July 24, 2009 12:23 PM):     07/24/2009: [facility] -- T-Basic Metabolic Panel [phone] (signed)     07/24/2009: [facility] -- T-Lipid Profile [phone] (signed)

## 2010-07-20 NOTE — Consult Note (Signed)
Summary: [facility]  [facility]   Imported By: Louretta Parma 01/07/2010 15:46:05  _____________________________________________________________________  External Attachment:    Type:   Image     Comment:   External Document

## 2010-07-20 NOTE — Progress Notes (Signed)
Summary: med refill/change/gp  Phone Note Refill Request Message from:  Fax from Pharmacy on September 23, 2008 12:03 PM  Refills Requested: Medication #1:  CARVEDILOL 12.5 MG TABS Take 1 tablet by mouth two times a day for your blood pressure   Last Refilled: 08/25/2008  Medication #2:  PROTONIX 40 MG TBEC .Take 1 tablet by mouth once a day                    **Protonix is no longer covered by pt.'s Medicare Plan; can you change to something else.**                        Thanks   Method Requested: Telephone to Pharmacy Initial call taken by: Chinita Pester RN,  September 23, 2008 12:05 PM  Follow-up for Phone Call        What PPI is covered by her plan? Follow-up by: Chauncey Reading DO,  September 24, 2008 9:21 AM  Additional Follow-up for Phone Call Additional follow up Details #1::        The pharmacist states Prilosec or the generic,but she said she does not have a list. Additional Follow-up by: Chinita Pester RN,  September 24, 2008 9:51 AM    Additional Follow-up for Phone Call Additional follow up Details #2::    Protonix changed to Prilosec.  Please tell pt and call in RX. Follow-up by: Chauncey Reading DO,  September 24, 2008 9:56 AM  Additional Follow-up for Phone Call Additional follow up Details #3:: Details for Additional Follow-up Action Taken: Dr. Landis Martins, can you refill Carvedilol12.5mg  also. Thanks Chinita Pester RN  September 24, 2008 10:17 AM   Please call in both RXs. Chauncey Reading DO  September 24, 2008 10:43 AM     Omepazole and Carvedilol refill faxed to [facility]. Chinita Pester RN  September 24, 2008 11:10 AM   New/Updated Medications: OMEPRAZOLE 40 MG CPDR (OMEPRAZOLE) Take 1 tablet by mouth once a day for acid reflux   Prescriptions: CARVEDILOL 12.5 MG TABS (CARVEDILOL) Take 1 tablet by mouth two times a day for your blood pressure  #60 x 5   Entered and Authorized by:   Chauncey Reading DO   Signed by:   Chauncey Reading DO on 09/24/2008   Method used:   Telephoned  to ...       [facility] (retail)       [address]       [place], [place]  [address]       Ph: [phone]       Fax: [phone]   RxID:   [id number] OMEPRAZOLE 40 MG CPDR (OMEPRAZOLE) Take 1 tablet by mouth once a day for acid reflux  #30 x 5   Entered and Authorized by:   Chauncey Reading DO   Signed by:   Chauncey Reading DO on 09/24/2008   Method used:   Telephoned to ...       [facility] (retail)       [address]       [place], [place]  [address]       Ph: [phone]       Fax: [phone]   RxID:   [id number]
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
## 2010-07-20 NOTE — Progress Notes (Signed)
Summary: refill/ hla  Phone Note Refill Request Message from:  Fax from Pharmacy on July 02, 2007 12:33 PM  Refills Requested: Medication #1:  METOPROLOL TARTRATE 25 MG TABS Take 1/2 tablet by mouth two times a day   Last Refilled: 10/10 Initial call taken by: Marin Roberts RN,  July 02, 2007 12:34 PM  Follow-up for Phone Call        Refill approved-nurse to complete Follow-up by: Chauncey Reading DO,  July 02, 2007 3:40 PM  Additional Follow-up for Phone Call Additional follow up Details #1::        Rx faxed to pharmacy Additional Follow-up by: Marin Roberts RN,  July 04, 2007 2:42 PM      Prescriptions: METOPROLOL TARTRATE 25 MG TABS (METOPROLOL TARTRATE) Take 1/2 tablet by mouth two times a day  #30 x 11   Entered and Authorized by:   Chauncey Reading DO   Signed by:   Chauncey Reading DO on 07/02/2007   Method used:   Telephoned to ...       [facility]       [address]       [place], [place]  [address]       Ph: [phone]       Fax: [phone]   RxID:   [id number]

## 2010-07-20 NOTE — Progress Notes (Signed)
Summary: Refill/gh  Phone Note Refill Request Message from:  Fax from Pharmacy on October 16, 2009 12:16 PM  Refills Requested: Medication #1:  VICODIN 5-500 MG TABS as needed  Method Requested: Fax to Local Pharmacy Initial call taken by: Angelina Ok RN,  October 16, 2009 12:17 PM  Follow-up for Phone Call        Refill approved-nurse to complete Follow-up by: Melida Quitter MD,  Oct 19, 2009 1:31 PM  Additional Follow-up for Phone Call Additional follow up Details #1::        Rx faxed to pharmacy Additional Follow-up by: Angelina Ok RN,  Oct 21, 2009 3:45 PM    Prescriptions: VICODIN 5-500 MG TABS (HYDROCODONE-ACETAMINOPHEN) as needed  #30 x 0   Entered and Authorized by:   Melida Quitter MD   Signed by:   Melida Quitter MD on 10/19/2009   Method used:   Printed then faxed to ...       [facility] (retail)       [address]       [place], [place]  [address]       Ph: [phone]       Fax: [phone]   RxID:   [id number]

## 2010-07-20 NOTE — Assessment & Plan Note (Signed)
Summary: est-ck/fu/meds/cfb   Vital Signs:  Patient profile:   65 year old female Height:      62 inches (157.48 cm) Weight:      155.7 pounds (70.77 kg) BMI:     28.58 Temp:     98.4 degrees F oral Pulse rate:   76 / minute BP sitting:   122 / 78  (right arm)  Vitals Entered By: Chinita Pester RN (December 24, 2009 2:33 PM) CC: F/U visit.   Is Patient Diabetic? No Pain Assessment Patient in pain? yes     Location: abdomen/back Intensity: 7 Type: aching Onset of pain  Intermittent Nutritional Status BMI of 25 - 29 = overweight  Have you ever been in a relationship where you felt threatened, hurt or afraid?No   Does patient need assistance? Functional Status Self care Ambulation Normal   Primary Care Provider:  Melida Quitter MD  CC:  F/U visit.  Marland Kitchen  History of Present Illness: 65 y/o female with PMH of:CAD, HTN, Hyperlipidemia, and GERD and HA.  Patient presents to [facility] for general check up.   Patient states she has submitted all the necessary paperwork to move to another apt complex. At her last visit, patient complained of subjective tinnitus symptoms that was impacting her hearing due to loud trains running near her complex. She has not moved yet, and is still attempting to do so. However she notes the buzzing sensation in her ears to have improved.   Patient denies CP, SOB, cough, HA, abdominal pain, N/V, changes in bowel habits, and urinary symptoms. No additional complaints or concerns today.        Depression History:      The patient denies a depressed mood most of the day and a diminished interest in her usual daily activities.         Preventive Screening-Counseling & Management  Alcohol-Tobacco     Alcohol drinks/day: rarely     Alcohol type: brandy     Smoking Status: current     Smoking Cessation Counseling: yes     Packs/Day: 2 cigs/day     Year Started: ABOUT THE AGE OF 10     Year Quit: AUGUST 2009  Caffeine-Diet-Exercise     Caffeine use/day:  2-3 coffee pots day     Does Patient Exercise: yes     Type of exercise: WALKING- stairs     Times/week: 7  Current Medications (verified): 1)  Hydrochlorothiazide 25 Mg Tabs (Hydrochlorothiazide) .... Take 1 Tablet By Mouth Once A Day 2)  Pravastatin Sodium 40 Mg  Tabs (Pravastatin Sodium) .... Take 1 Tablet By Mouth At Bedtime 3)  Nitroglycerin 0.4 Mg Subl (Nitroglycerin) .... Place 1 Tablet Under The Tongue Every 5 Minutes Up To 3 Times As Needed For Chest Pain 4)  Aspirin 81 Mg Tbec (Aspirin) .... Take 1 Tablet By Mouth Once A Day 5)  Carvedilol 12.5 Mg Tabs (Carvedilol) .... Take 1 Tablet By Mouth Two Times A Day For Your Blood Pressure 6)  Ventolin Hfa 108 (90 Base) Mcg/act Aers (Albuterol Sulfate) .... Take 1-2 Puffs Up To Every 4 Hours As Needed For Shortness of Breath, Cough or Wheezing 7)  Vicodin 5-500 Mg Tabs (Hydrocodone-Acetaminophen) .... As Needed 8)  Omeprazole 40 Mg Cpdr (Omeprazole) .... Take 1 Tablet By Mouth Once A Day For Acid Reflux  Allergies (verified): 1)  ! * Tape 2)  ! 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  Coronary artery disease, nonobstructive, 30% LAD 9/07 cath Irritable bowel syndrome Atypical CP - neg stress test 2009  Past Surgical History: Last updated: 10/23/2006 Appendectomy Hysterectomy Percutaneous transluminal coronary angioplasty Lumpectomy-Right breast areola  Family History: Last updated: 01/14/2008 Family History High cholesterol all sibilings and parents parents, oldest brother died with heart attack older sister had triple bypass one sister died of stomach cancer at age of 69  Social History: Last updated: 12/24/2009 lives alone in the same apartment  complex and in process of moving when paperwork is approved, does volunteer work Current Smoker - smokes 1-2 cigarettes daily  Alcohol use-no Drug use-no Regular exercise-yes  Risk Factors: Alcohol Use: rarely (12/24/2009) Caffeine Use: 2-3 coffee pots day (12/24/2009) Exercise: yes (12/24/2009)  Risk Factors: Smoking Status: current (12/24/2009) Packs/Day: 2 cigs/day (12/24/2009)  Social History: lives alone in the same apartment complex and in process of moving when paperwork is approved, does volunteer work Current Smoker - smokes 1-2 cigarettes daily  Alcohol use-no Drug use-no Regular exercise-yes Caffeine use/day:  2-3 coffee pots day  Review of Systems      See HPI  Physical Exam  General:  alert and well-developed.   Head:  normocephalic and atraumatic.   Eyes:  vision grossly intact, pupils equal, pupils round, and pupils reactive to light.   Mouth:  good dentition and pharynx pink and moist.   Neck:  supple, full ROM, and no masses.   Lungs:  normal respiratory effort and normal breath sounds.   Heart:  normal rate, regular rhythm, no murmur, no gallop, and no rub.   Abdomen:  soft, non-tender, normal bowel sounds, and no distention.   Msk:  normal ROM.   Pulses:  pulses normal  Extremities:  no LE edema Neurologic:  alert & oriented X3 and cranial nerves II-XII intact.     Impression & Recommendations:  Problem # 1:  HYPERTENSION (ICD-401.9) Assessment Improved  Blood pressure stable. Will check renal function today. Will continue current regimen.  Her updated medication list for this problem includes:    Hydrochlorothiazide 25 Mg Tabs (Hydrochlorothiazide) .Marland Kitchen... Take 1 tablet by mouth once a day    Carvedilol 12.5 Mg Tabs (Carvedilol) .Marland Kitchen... Take 1 tablet by mouth two times a day for your blood pressure  BP today: 122/78 Prior BP: 152/93 (10/07/2009)  Labs Reviewed: K+: 4.9 (07/24/2009) Creat: : 0.76 (07/24/2009)   Chol: 195 (07/24/2009)   HDL: 39  (07/24/2009)   LDL: 102 (07/24/2009)   TG: 268 (07/24/2009)  Orders: T-Basic Metabolic Panel [phone])  Problem # 2:  SUBJECTIVE TINNITUS (ICD-388.31) Assessment: Comment Only Patient had recent audiology visit 12/17/2009. Will contact facility regarding work-up. Pt reports little change in symptoms. Still appreciates a buzzing noise in her ears, however reports some improvement. Was likely 2/2 loud trains running close to her apt complex. Pt still in process of moving to another complex.  Problem # 3:  CORONARY ARTERY DISEASE (ICD-414.00) Assessment: Comment Only Stable, no recent chest pain. Will continue current regimen.   Her updated medication list for this problem includes:    Hydrochlorothiazide 25 Mg Tabs (Hydrochlorothiazide) .Marland Kitchen... Take 1 tablet by mouth once a day    Nitroglycerin 0.4 Mg Subl (Nitroglycerin) .Marland Kitchen... Place 1 tablet under the tongue every 5 minutes up to 3 times as needed for chest pain    Aspirin 81 Mg  Tbec (Aspirin) .Marland Kitchen... Take 1 tablet by mouth once a day    Carvedilol 12.5 Mg Tabs (Carvedilol) .Marland Kitchen... Take 1 tablet by mouth two times a day for your blood pressure  Problem # 4:  HYPERLIPIDEMIA (ICD-272.4) Assessment: Comment Only Pt reports improvements in diet. Will check FLP at next visit when patient is fasting. LDL slightly elevated. Will continue current dosage of statin for now.   Her updated medication list for this problem includes:    Pravastatin Sodium 40 Mg Tabs (Pravastatin sodium) .Marland Kitchen... Take 1 tablet by mouth at bedtime  Labs Reviewed: SGOT: 28 (01/14/2009)   SGPT: 18 (01/14/2009)   HDL:39 (07/24/2009), 54 (07/10/2008)  LDL:102 (07/24/2009), 88 (16/03/9603)  Chol:195 (07/24/2009), 165 (07/10/2008)  Trig:268 (07/24/2009), 114 (07/10/2008)  Complete Medication List: 1)  Hydrochlorothiazide 25 Mg Tabs (Hydrochlorothiazide) .... Take 1 tablet by mouth once a day 2)  Pravastatin Sodium 40 Mg Tabs (Pravastatin sodium) .... Take 1 tablet by mouth at  bedtime 3)  Nitroglycerin 0.4 Mg Subl (Nitroglycerin) .... Place 1 tablet under the tongue every 5 minutes up to 3 times as needed for chest pain 4)  Aspirin 81 Mg Tbec (Aspirin) .... Take 1 tablet by mouth once a day 5)  Carvedilol 12.5 Mg Tabs (Carvedilol) .... Take 1 tablet by mouth two times a day for your blood pressure 6)  Ventolin Hfa 108 (90 Base) Mcg/act Aers (Albuterol sulfate) .... Take 1-2 puffs up to every 4 hours as needed for shortness of breath, cough or wheezing 7)  Vicodin 5-500 Mg Tabs (Hydrocodone-acetaminophen) .... As needed 8)  Omeprazole 40 Mg Cpdr (Omeprazole) .... Take 1 tablet by mouth once a day for acid reflux  Prevention & Chronic Care Immunizations   Influenza vaccine: Fluvax 3+  (04/14/2008)   Influenza vaccine deferral: Refused  (07/24/2009)    Tetanus booster: Not documented    Pneumococcal vaccine: Not documented    H. zoster vaccine: Not documented  Colorectal Screening   Hemoccult: Not documented    Colonoscopy: Done by Dr. Rob Bunting (08/09/2004) no polyps, masses or diverticular disease noted repeat in 10 years  (08/09/2004)  Other Screening   Pap smear: Normal  (10/23/2006)   Pap smear action/deferral: Not indicated S/P hysterectomy  (01/14/2009)    Mammogram: ASSESSMENT: Negative - BI-RADS 1^MM DIGITAL SCREENING  (07/30/2009)   Mammogram action/deferral: Ordered  (07/24/2009)   Mammogram due: 04/2009    DXA bone density scan: Not documented   Smoking status: current  (12/24/2009)   Smoking cessation counseling: yes  (12/24/2009)  Lipids   Total Cholesterol: 195  (07/24/2009)   LDL: 102  (07/24/2009)   LDL Direct: Not documented   HDL: 39  (07/24/2009)   Triglycerides: 268  (07/24/2009)    SGOT (AST): 28  (01/14/2009)   SGPT (ALT): 18  (01/14/2009)   Alkaline phosphatase: 63  (01/14/2009)   Total bilirubin: 0.5  (01/14/2009)    Lipid flowsheet reviewed?: Yes   Progress toward LDL goal: Unchanged  Hypertension   Last  Blood Pressure: 122 / 78  (12/24/2009)   Serum creatinine: 0.76  (07/24/2009)   BMP action: Ordered   Serum potassium 4.9  (07/24/2009)    Hypertension flowsheet reviewed?: Yes   Progress toward BP goal: Improved  Self-Management Support :   Personal Goals (by the next clinic visit) :      Personal blood pressure goal: 130/80  (12/24/2009)     Personal LDL goal: 70  (12/24/2009)    Patient will work on the following  items until the next clinic visit to reach self-care goals:     Medications and monitoring: take my medicines every day, bring all of my medications to every visit  (12/24/2009)     Eating: use fresh or frozen vegetables, eat foods that are low in salt, eat baked foods instead of fried foods  (12/24/2009)     Activity: park at the far end of the parking lot  (12/24/2009)    Hypertension self-management support: Resources for patients handout, Written self-care plan  (12/24/2009)   Hypertension self-care plan printed.    Lipid self-management support: Resources for patients handout, Written self-care plan  (12/24/2009)   Lipid self-care plan printed.      Resource handout printed.  Process Orders Check Orders Results:     [facility]: Check successful Order queued for requisitioning for [facility]: December 24, 2009 3:04 PM  Tests Sent for requisitioning (December 24, 2009 3:04 PM):     12/24/2009: [facility] -- T-Basic Metabolic Panel [phone] (signed)   Process Orders Check Orders Results:     [facility]: Check successful Order queued for requisitioning for [facility]: December 24, 2009 3:04 PM  Tests Sent for requisitioning (December 24, 2009 3:04 PM):     12/24/2009: [facility] -- T-Basic Metabolic Panel [phone] (signed)    Appended Document: Discharge Instructions           Complete Medication List: 1)  Hydrochlorothiazide 25 Mg Tabs (Hydrochlorothiazide) .... Take 1 tablet by mouth once  a day 2)  Pravastatin Sodium 40 Mg Tabs (Pravastatin sodium) .... Take 1 tablet by mouth at bedtime 3)  Nitroglycerin 0.4 Mg Subl (Nitroglycerin) .... Place 1 tablet under the tongue every 5 minutes up to 3 times as needed for chest pain 4)  Aspirin 81 Mg Tbec (Aspirin) .... Take 1 tablet by mouth once a day 5)  Carvedilol 12.5 Mg Tabs (Carvedilol) .... Take 1 tablet by mouth two times a day for your blood pressure 6)  Ventolin Hfa 108 (90 Base) Mcg/act Aers (Albuterol sulfate) .... Take 1-2 puffs up to every 4 hours as needed for shortness of breath, cough or wheezing 7)  Vicodin 5-500 Mg Tabs (Hydrocodone-acetaminophen) .... As needed 8)  Omeprazole 40 Mg Cpdr (Omeprazole) .... Take 1 tablet by mouth once a day for acid reflux   Patient Instructions: 1)  Please schedule a follow-up appointment in 3 months. 2)  Please be fasting at this appointment as you will be getting cholesterol checked.  3)  Tobacco is very bad for your health and your loved ones! You Should stop smoking!. 4)  Stop Smoking Tips: Choose a Quit date. Cut down before the Quit date. decide what you will do as a substitute when you feel the urge to smoke(gum,toothpick,exercise).

## 2010-07-20 NOTE — Assessment & Plan Note (Signed)
Summary: est-ck/fu/meds/cfb   Vital Signs:  Patient Profile:   65 Years Old Female Height:     62 inches (157.48 cm) Weight:      153.6 pounds (69.82 kg) BMI:     28.20 Temp:     98.1 degrees F (36.72 degrees C) oral Pulse rate:   86 / minute BP sitting:   152 / 102  (right arm) Cuff size:   regular  Vitals Entered ByMarland Kitchen Theotis Barrio (March 15, 2007 3:16 PM)             Is Patient Diabetic? No Nutritional Status NORMA.LNOY  Have you ever been in a relationship where you felt threatened, hurt or afraid?No   Does patient need assistance? Functional Status Self care Ambulation Normal     PCP:  Chauncey Reading, DO  Chief Complaint:  PATIENT STATES THAT HER BLOOD PRESSURE HAS BEEN UP FOR ABOUT A WEEK/ MED REFILL.  History of Present Illness: 65 yo F well known to me who presents for blood pressure fluctuations.  Blood pressure was 130-170 and then 102/70 yesterday.  She has a blood pressure cuff that she uses at home.  She has it tested so she knows its accurate.  She also has redness and pain in her left eyelid. Shes also complaining of lack of energy recently. She walks 5-10 miles/day usually, but the past 2 weeks she's been very fatigued.  She hasn't been eating very well or sleeping very well.  She's been awake since Tues AM.  She's been eating ice and drinking a lot of water recently.  She can sustain herself on 2 crackers and a piece of cheese daily.  She hasn't lost or gained any weight recently.  She's not had an fevers or chills.  No night sweats.  No congestion, N/V/D or pain anywhere.  Her mouth has been dry at night.  She did not take her meds today. She continues to smoke about 1/4 ppd, down from 2-3 ppd though.  Current Allergies: ! * TAPE ! IODINE  Past Medical History:    Reviewed history from 10/23/2006 and no changes required:       GERD       Hyperlipidemia       Hypertension       Constipation       Chest Pain, atypical with negative cardiac workup  (cath 9/07 with nonobstructive CAD)       Hemorrhoids, internal, thrombosed       Tobacco Abuse       Fibroadenoma, right breast-Confirmed by [facility] 09/26/06        Coronary artery disease, nonobstructive, 30% LAD 9/07 cath       Irritable bowel syndrome  Past Surgical History:    Reviewed history from 10/23/2006 and no changes required:       Appendectomy       Hysterectomy       Percutaneous transluminal coronary angioplasty       Lumpectomy-Right breast areola    Risk Factors:  Tobacco use:  current    Year started:  ABOUT THE AGE OF 10    Cigarettes:  Yes -- 1/4 pack(s) per day Alcohol use:  no Exercise:  yes    Times per week:  3    Type:  WALKING AND AROBIC Seatbelt use:  100 %  PAP Smear History:    Date of Last PAP Smear:  10/23/2006   Review of Systems      See HPI  Physical Exam  General:     AAO x 3 in NAD, paler than usual Eyes:     EOMI, PERRL, anicteric, reddened area left eyelid near lashes, no pustule noted, no discharge Mouth:     OP clear, mmm Neck:     supple, full ROM, and no masses.   Lungs:     CTA B/L Heart:     normal rate, regular rhythm, and no murmur.   Abdomen:     soft, non-tender, and normal bowel sounds.   Extremities:     No C/C/E Cervical Nodes:     no anterior cervical adenopathy and no posterior cervical adenopathy.   Axillary Nodes:     no R axillary adenopathy and no L axillary adenopathy.   Psych:     More subdued than usual.    Impression & Recommendations:  Problem # 1:  HYPERTENSION (ICD-401.9) The pts BP is usually well controlled and she didn't take her meds yet today so I did not make any changes.  She will RTC in 1 month to have her BP checked and to f/up for Problem #2.  Will adjust her meds at that time if her BP is still elevated.  Problem # 2:  FATIGUE (ICD-780.79) The pt looks paler than usual and states she eats a lot of ice (?pica).  I will check a CBC today to make sure she's not anemic.  She's UTD on  her cancer screening (except I don't recall if/when she had a colonoscopy, will check the paperchart).  She doesn't have any B sx to suggest CA, but she is a smoker.  She doesn't have any sx to suggest an infection either, but will check her WBCs as well.  Will check a TSH and CMET to be complete.  Could be from her mild conjunctivitis, although I doubt it.  I have recommended that she use a warm compress on her eye two times a day and saline eye drops until the reddness improves. Orders: T-Comprehensive Metabolic Panel [id number]) T-CBC w/Diff [phone]) T-TSH [phone])   Problem # 3:  HEMORRHOIDS (ICD-455.6) The pt states she has had no rectal bleeding ever since her hemorrhoid was surgically removed.  Will check a CBC to be complete.  Complete Medication List: 1)  Hydrochlorothiazide 25 Mg Tabs (Hydrochlorothiazide) .... Take 1 tablet by mouth once a day 2)  Protonix 40 Mg Tbec (Pantoprazole sodium) .... .take 1 tablet by mouth once a day 3)  Pravachol 10 Mg Tabs (Pravastatin sodium) .... Take 1 tablet by mouth once a day 4)  Nitroglycerin 0.4 Mg Subl (Nitroglycerin) .... Place 1 tablet under the tongue every 5 minutes up to 3 times as needed for chest pain 5)  Aspirin 81 Mg Tbec (Aspirin) .... Take 1 tablet by mouth once a day 6)  Metoprolol Tartrate 25 Mg Tabs (Metoprolol tartrate) .... Take 1/2 tablet by mouth two times a day 7)  Analpram-hc 1-2.5 % Lotn (Hydrocortisone ace-pramoxine) .... Apply two times a day to affected area 8)  Hyoscyamine Sulfate 0.125 Mg Subl (Hyoscyamine sulfate) .... Place 1 tablet under the tongue up to every 4 hours a day as needed for bloating 9)  Vicodin 5-500 Mg Tabs (Hydrocodone-acetaminophen) .... Take 1-2 tablet by mouth up to every 6 hours as needed 10)  Cafergot 1-100 Mg Tabs (Ergotamine-caffeine) .... Take 2 tablets at onset of headache and then 1 tablet up to every 30 minutes as needed.  max of 6 tablets per day or 10 tablets per  week.    Patient Instructions: 1)  Please schedule a follow-up appointment in 1 month with Dr.Fuquan Wilson. 2)  You will be called with any abnormalities in your blood work. 3)  Place a warm compress on your left eye twice a day and use saline eye drops as needed until your eye gets better.    ]

## 2010-07-20 NOTE — Assessment & Plan Note (Signed)
Summary: CHECKUP/SB.   Vital Signs:  Patient Profile:   65 Years Old Female Height:     62 inches (157.48 cm) Weight:      156.1 pounds (70.95 kg) BMI:     28.65 Temp:     97.1 degrees F (36.17 degrees C) oral Pulse rate:   67 / minute BP sitting:   137 / 92  (left arm) Cuff size:   large  Pt. in pain?   no  Vitals Entered By: Krystal Eaton Duncan Dull) (July 10, 2008 11:25 AM)              Is Patient Diabetic? No Nutritional Status BMI of 25 - 29 = overweight  Have you ever been in a relationship where you felt threatened, hurt or afraid?No   Does patient need assistance? Functional Status Self care Ambulation Normal          PCP:  Chauncey Reading, DO  Chief Complaint:  routine f/u.  History of Present Illness: 65 yo F who presents for a checkup.  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  She feels much better getting all this exercise on a daily basis. She is doing well on the increased Coreg and Gemfibrozil.  They made her feel better. She's had a hysterectomy in the 80s.   She states the inhaler is helping her a lot - she's only using it about once or twice a week. She did not take her meds this AM.  She had precancerous cells noted on path from her hemorrhoidectomy.  Next colon? Denies HA, CP, SOB, N/V/D/C, abd pain, change in bowel/bladder habits or blood from any orifice.  Does have occasional bloating still, but has not used any hyoscyamine.  Does have rare migrains, Cafergot helps.   Serial Vital Signs/Assessments:  Time      Position  BP       Pulse  Resp  Temp     By           R Arm     130/90                         Chauncey Reading DO    Prior Medications Reviewed Using: Patient Recall  Prior Medication List:  HYDROCHLOROTHIAZIDE 25 MG TABS (HYDROCHLOROTHIAZIDE) Take 1 tablet by mouth once a day PROTONIX 40 MG TBEC (PANTOPRAZOLE SODIUM) .Take 1 tablet by  mouth once a day PRAVASTATIN SODIUM 40 MG  TABS (PRAVASTATIN SODIUM) Take 1 tablet by mouth at bedtime NITROGLYCERIN 0.4 MG SUBL (NITROGLYCERIN) Place 1 tablet under the tongue every 5 minutes up to 3 times as needed for chest pain ASPIRIN 81 MG TBEC (ASPIRIN) Take 1 tablet by mouth once a day HYOSCYAMINE SULFATE 0.125 MG/ML SOLN (HYOSCYAMINE SULFATE) Place 1 drop on tongue up to ever 6 hours as needed for bloating CAFERGOT 1-100 MG TABS (ERGOTAMINE-CAFFEINE) Take 2 tablets at onset of headache and then 1 tablet up to every 30 minutes as needed.  Max of 6 tablets per day or 10 tablets per week. CARVEDILOL 12.5 MG TABS (CARVEDILOL) Take 1 tablet by mouth two times a day for your blood pressure GEMFIBROZIL 600 MG  TABS (GEMFIBROZIL) Take 1 tablet by mouth two times a day for your cholesterol VENTOLIN HFA 108 (90 BASE) MCG/ACT AERS (ALBUTEROL SULFATE) Take 1-2 puffs up to every 4 hours as  needed for shortness of breath, cough or wheezing   Current Allergies (reviewed today): ! * TAPE ! 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        Coronary artery disease, nonobstructive, 30% LAD 9/07 cath       Irritable bowel syndrome       Atypical CP - neg stress test 2009  Past Surgical History:    Reviewed history from 10/23/2006 and no changes required:       Appendectomy       Hysterectomy       Percutaneous transluminal coronary angioplasty       Lumpectomy-Right breast areola   Family History:    Reviewed history from 01/14/2008 and no changes required:       Family History High cholesterol all sibilings and parents       parents, oldest brother  died with heart attack       older sister had triple bypass       one sister died of stomach cancer at age of 5                Social History:    Reviewed history from 04/14/2008 and no changes required:       living my herself, does Agricultural consultant work        Current Smoker       Alcohol use-no       Drug use-no       Regular exercise-yes   Risk Factors:  Tobacco use:  current    Year started:  ABOUT THE AGE OF 10    Cigarettes:  Yes -- 1-2cigs/day pack(s) per day    Counseled to quit/cut down tobacco use:  yes Drug use:  no Alcohol use:  no Exercise:  yes    Times per week:  3-5    Type:  WALKING AND AROBIC Seatbelt use:  100 %  Mammogram History:    Date of Last Mammogram:  04/08/2008  @  [facility]  PAP Smear History:    Date of Last PAP Smear:  10/23/2006   Review of Systems      See HPI   Physical Exam  General:     Well-developed,well-nourished,in no acute distress; alert,appropriate and cooperative throughout examination Eyes:     EOMI, anicteric Mouth:     mmm Neck:     supple, full ROM, and no masses.   Lungs:     normal respiratory effort, no intercostal retractions, no accessory muscle use, normal breath sounds, no crackles, and no wheezes.   Heart:     normal rate, regular rhythm, no murmur, no gallop, and no rub.   Abdomen:     soft, non-tender, and normal bowel sounds.   Pulses:     R radial normal and L radial normal.   Extremities:     No edema. Neurologic:     alert & oriented X3.   Skin:     Intact  without suspicious lesions or rashes Psych:     Appropriate.    Impression & Recommendations:  Problem # 1:  HYPERTENSION (ICD-401.9) Goal is <140/80.  While her systolic is at goal, her diastolic is not.  She did not take her meds this AM, so I did not make any changes to her meds.  Advised her to take her meds the AM before our next visit to see what her BP is on her regular doses.  If still not at goal, would increase Coreg and have her RTC  in 2 weeks for a BP check (pulse 67, but Coreg does not affect pulse as much as other selective BB).  Her updated medication list for this problem includes:    Hydrochlorothiazide 25 Mg Tabs (Hydrochlorothiazide) .Marland Kitchen... Take 1 tablet by mouth once a day    Carvedilol 12.5 Mg Tabs (Carvedilol) .Marland Kitchen... Take 1 tablet by mouth two times a day for your blood pressure  BP today: 137/92 Prior BP: 140/74 (04/14/2008)  Labs Reviewed: Creat: 0.84 (03/15/2007) Chol: 178 (04/14/2008)   HDL: 47 (04/14/2008)   LDL: 111 (04/14/2008)   TG: 101 (04/14/2008)   Problem # 2:  HYPERLIPIDEMIA (ICD-272.4) Goal LDL <100 given RF for CAD.  Will recheck now that she's been on Gemfibrozil since last check.  Her updated medication list for this problem includes:    Pravastatin Sodium 40 Mg Tabs (Pravastatin sodium) .Marland Kitchen... Take 1 tablet by mouth at bedtime    Gemfibrozil 600 Mg Tabs (Gemfibrozil) .Marland Kitchen... Take 1 tablet by mouth two times a day for your cholesterol  Orders: T-Lipid Profile ([address])  Labs Reviewed: Chol: 178 (04/14/2008)   HDL: 47 (04/14/2008)   LDL: 111 (04/14/2008)   TG: 101 (04/14/2008) SGOT: 19 (03/15/2007)   SGPT: 10 (03/15/2007)   Problem # 3:  CHEST DISCOMFORT (ICD-786.59) Most likely due to mild COPD - will change this problem to reflect this.  Since she's only using her inhaler twice a week at most, will continue this and not add any long-acting meds at this time.  If she starts to progress with her symtoms, would get PFTs and add Spiriva.   Problem # 4:  GERD (ICD-530.81) Well controlled.  Her updated medication list for this problem includes:    Protonix 40 Mg Tbec (Pantoprazole sodium) ..... Marland Kitchentake 1 tablet by mouth once a day    Hyoscyamine Sulfate 0.125 Mg/ml Soln (Hyoscyamine sulfate) .Marland Kitchen... Place 1 drop on tongue up to ever 6 hours as needed for bloating   Problem # 5:  TOBACCO ABUSE (ICD-305.1) Encouraged smoking cessation and discussed different methods for smoking  cessation.   Problem # 6:  Preventive Health Care (ICD-V70.0) Last colon 2006 by Dr. Randa Evens - hemorrhoids noted.   Had hemorrhoidectomy 07/2006 and path showed pre-cancerous cells.  Asked pt to call Dr. Billey Chang office for f/up regarding this. MMG done - neg. s/p hyster, therefore no PAPs needed.  Complete Medication List: 1)  Hydrochlorothiazide 25 Mg Tabs (Hydrochlorothiazide) .... Take 1 tablet by mouth once a day 2)  Protonix 40 Mg Tbec (Pantoprazole sodium) .... .take 1 tablet by mouth once a day 3)  Pravastatin Sodium 40 Mg Tabs (Pravastatin sodium) .... Take 1 tablet by mouth at bedtime 4)  Nitroglycerin 0.4 Mg Subl (Nitroglycerin) .... Place 1 tablet under the tongue every 5 minutes up to 3 times as needed for chest pain 5)  Aspirin 81 Mg Tbec (Aspirin) .... Take 1 tablet by mouth once a day 6)  Hyoscyamine Sulfate 0.125  Mg/ml Soln (Hyoscyamine sulfate) .... Place 1 drop on tongue up to ever 6 hours as needed for bloating 7)  Cafergot 1-100 Mg Tabs (Ergotamine-caffeine) .... Take 2 tablets at onset of headache and then 1 tablet up to every 30 minutes as needed.  max of 6 tablets per day or 10 tablets per week. 8)  Carvedilol 12.5 Mg Tabs (Carvedilol) .... Take 1 tablet by mouth two times a day for your blood pressure 9)  Gemfibrozil 600 Mg Tabs (Gemfibrozil) .... Take 1 tablet by mouth two times a day for your cholesterol 10)  Ventolin Hfa 108 (90 Base) Mcg/act Aers (Albuterol sulfate) .... Take 1-2 puffs up to every 4 hours as needed for shortness of breath, cough or wheezing   Patient Instructions: 1)  Please schedule a follow-up appointment in 3 months with Dr. Landis Martins. 2)  You will be called with any abnormalities in your bloodwork from today.  If you don't hear from [place] within a week, you can assume that your bloodwork was normal.   Prescriptions: CAFERGOT 1-100 MG TABS (ERGOTAMINE-CAFFEINE) Take 2 tablets at onset of headache and then 1 tablet up to every 30 minutes as needed.   Max of 6 tablets per day or 10 tablets per week.  #30 x 1   Entered and Authorized by:   Chauncey Reading DO   Signed by:   Chauncey Reading DO on 07/10/2008   Method used:   Telephoned to ...       [facility] / [facility] (retail)       [address]       [place], [place]  [address]       Ph: [phone]       Fax: [phone]   RxID:   [id number]

## 2010-07-20 NOTE — Progress Notes (Signed)
Summary: med refill/gp  Phone Note Refill Request Message from:  Fax from Pharmacy on July 03, 2009 3:02 PM  Refills Requested: Medication #1:  OMEPRAZOLE 40 MG CPDR Take 1 tablet by mouth once a day for acid reflux.   Dosage confirmed as above?Dosage Confirmed   Supply Requested: 1 year   Last Refilled: 04/13/2009  Medication #2:  VICODIN 5-500 MG TABS as needed   Dosage confirmed as above?Dosage Confirmed   Supply Requested: 1 month   Last Refilled: 07/21/2008   Vicodin - take 1 or 2 tablets every 6 hours as needed   Pt. stated she has back pain and h/a's; and she does not take Vicodin all the time.  She missed her last appt. b/c diarrhea.   Method Requested: Fax to Local Pharmacy Initial call taken by: Chinita Pester RN,  July 03, 2009 3:02 PM  Follow-up for Phone Call        Alexis Kline is being used as an abortive for her migraine headaches according to the last clinic note.  Will prescribe #15 to have on hand as an abortive.  Further refills will require that she be seen by Dr. Baltazar Apo as she no-showed the last 2 appointments.  Please schedule Alexis Kline with Dr. Vinie Sill in her next available non-overbook appointment.  Thank You. Follow-up by: Doneen Poisson MD,  July 03, 2009 3:45 PM  Additional Follow-up for Phone Call Additional follow up Details #1::        Rxs. faxed to [facility].  [place] sent to [facility] for an appt. Additional Follow-up by: Chinita Pester RN,  July 03, 2009 4:22 PM    Prescriptions: OMEPRAZOLE 40 MG CPDR (OMEPRAZOLE) Take 1 tablet by mouth once a day for acid reflux  #30 x 11   Entered and Authorized by:   Doneen Poisson MD   Signed by:   Doneen Poisson MD on 07/03/2009   Method used:   Printed then faxed to ...       [facility] (retail)       [address]       [name], [place]  [address]       Ph: [phone]       Fax: [phone]   RxID:   [id number] VICODIN 5-500 MG TABS (HYDROCODONE-ACETAMINOPHEN) as  needed  #15 x 0   Entered and Authorized by:   Doneen Poisson MD   Signed by:   Doneen Poisson MD on 07/03/2009   Method used:   Printed then faxed to ...       [facility] (retail)       [address]       [place], [place]  [address]       Ph: [phone]       Fax: [phone]   RxID:   [id number]

## 2010-07-20 NOTE — Progress Notes (Signed)
Summary: Refill/gh  Phone Note Refill Request Message from:  Pharmacy on March 30, 2007 11:11 AM  Refills Requested: Medication #1:  HYDROCHLOROTHIAZIDE 25 MG TABS Take 1 tablet by mouth once a day   Last Refilled: 02/21/2007  Medication #2:  PRAVACHOL 10 MG TABS Take 1 tablet by mouth once a day   Last Refilled: 01/25/2007  Method Requested: Electronic Initial call taken by: Angelina Ok RN,  March 30, 2007 11:12 AM  Follow-up for Phone Call        Venita Sheffield - please call in the HCTZ and Pravachol to her pharmacy - I have signed the prescriptions as "telephone" since I can't send them to [facility] electronically.  Thanks. Follow-up by: Chauncey Reading DO,  April 02, 2007 10:10 PM  Additional Follow-up for Phone Call Additional follow up Details #1::        Rx faxed to pharmacy Additional Follow-up by: Angelina Ok RN,  April 03, 2007 10:00 AM      Prescriptions: PRAVACHOL 10 MG TABS (PRAVASTATIN SODIUM) Take 1 tablet by mouth once a day  #30 x 5   Entered and Authorized by:   Chauncey Reading DO   Signed by:   Chauncey Reading DO on 04/02/2007   Method used:   Telephoned to ...       [facility] / [facility]       [address]       [place], [place]  [address]       Ph: [phone]       Fax: [phone]   RxID:   [id number] HYDROCHLOROTHIAZIDE 25 MG TABS (HYDROCHLOROTHIAZIDE) Take 1 tablet by mouth once a day  #30 x 4   Entered and Authorized by:   Chauncey Reading DO   Signed by:   Chauncey Reading DO on 04/02/2007   Method used:   Telephoned to ...       [facility] Pierini / [facility]       [address]       [place], [place]  [address]       Ph: [phone]       Fax: [phone]   RxID:   [id number]

## 2010-07-20 NOTE — Progress Notes (Signed)
Summary: wrong referral  Phone Note Call from Patient Call back at Home Phone [phone]   Caller: Patient Call For: Braxdon Gappa  Reason for Call: Talk to Nurse, Referral Details for Reason: wrong referral  Summary of Call: pt was referred to a surgeon Dr Carolynne Edouard but is sch with a different Dr.. Dr Janee Morn on 3/24 @ 11:20 am would like to discuss with nurse  Initial call taken by: Guad[place]pe [facility],  September 03, 2008 3:27 PM  Follow-up for Phone Call        called [facility]  appt changed to Dr Carolynne Edouard Follow-up by: Chales Abrahams CMA,  September 03, 2008 3:37 PM

## 2010-07-20 NOTE — Assessment & Plan Note (Signed)
Summary: ACUTE-F/U PER DR AGNEW-([facility])   Vital Signs:  Patient Profile:   65 Years Old Female Height:     62 inches Weight:      155.2 pounds Temp:     97.6 degrees F oral Pulse rate:   70 / minute BP sitting:   120 / 70  (right arm)  Pt. in pain?   no  Vitals Entered By: Geannie Risen RN (Oct 23, 2006 2:42 PM)              Is Patient Diabetic? No Nutritional Status Normal  Have you ever been in a relationship where you felt threatened, hurt or afraid?No   Does patient need assistance? Functional Status Self care Ambulation Normal   PCP:  Chauncey Reading, DO  Chief Complaint:  Preventive Care-PAP AND BREAST exam.  History of Present Illness: Alexis Kline is here today for her Pap Smear and Breast Exam as there is a question as to whether or not she has a cervix. She recently had an U/S for a chronic lump to her right breast which has always been confirmed as a fibroadenoma on exam by U/S. She states she had some sort of lumpectomy of a mass to her right areola many years ago.   A common theme for her however is that she usually gets a general mastalgia bilaterally every month. Apprently she has never sought and medical treatment for this condition. She is post-menopausal.    Current Allergies (reviewed today): ! * TAPE ! 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     Physical Exam  General:     alert, well-developed, and well-nourished.   Breasts:     no nipple discharge, no adenopathy, R breast tender, R brest dimpling to prevoius lumpectomy with some R nipple inversion. L breast tender with generalized fibrous dense tissue to subareolar region bilaterally.   Pelvic Exam  Vulva:      normal appearance, normal hair distribution, no lesions or masses.   Urethra and Bladder:      Urethra--normal, no masses, non-tender, no discharge.   Vagina:      no masses, no cystocele, adequate pelvic support, post-menopausal, copious discharge.   Cervix:      unable to determine with confidence if present Uterus:      absent Adnexa:      no masses bilaterally, mobile bilaterally, nontender bilaterally.  no obvious masses on exam Rectum:      anal sphincter intact, + external hemorrhoids.       Impression & Recommendations:  Problem # 1:  Screening Breast Cancer (ICD-V76.10) Exam today appears to confirm right breast inversion from lumpectomy with some dimpling. Overall her degree of tenderness is interesting, especially as she is  post-menopausal. The recent fibroadenoma appears unchanged.   Problem # 2:  Screening Cervical Cancer (ICD-V76.2) A Pap smear was attempted today, although I could not with certainity see a cervix.   Problem # 3:  FIBROADENOMA, BREAST (ICD-610.2) As per her history a f/u u/s will be needed in 6 months.   Other Orders: Pap Smear ([address]) T-Pap Smear, Thin Prep (U0454)   Patient Instructions: 1)  Please continue to do your own self breast exams. 2)  Please schedule an appointment with your primary doctor in 2- 3 months-Dr. Landis Martins   Vital Signs:  Patient Profile:   65 Years Old Female Height:     62 inches Weight:      155.2 pounds Temp:     97.6 degrees F oral Pulse rate:   70 / minute BP sitting:   120 / 70

## 2010-07-20 NOTE — Procedures (Signed)
Summary: Colonoscopy  Colonoscopy   Imported By: Sherian Rein 09/08/2008 08:00:27  _____________________________________________________________________  External Attachment:    Type:   Image     Comment:   External Document

## 2010-07-20 NOTE — Assessment & Plan Note (Signed)
Summary: CHECKUP/SB.   Vital Signs:  Patient Profile:   65 Years Old Female Height:     62 inches (157.48 cm) Weight:      157.3 pounds (71.50 kg) Temp:     98.1 degrees F (36.72 degrees C) oral Pulse rate:   85 / minute BP sitting:   150 / 95  (right arm) Cuff size:   regular  Pt. in pain?   no  Vitals Entered By: Krystal Eaton Duncan Dull) (May 14, 2007 3:13 PM)              Is Patient Diabetic? No  Have you ever been in a relationship where you felt threatened, hurt or afraid?No   Does patient need assistance? Functional Status Self care Ambulation Normal     PCP:  Chauncey Reading, DO  Chief Complaint:  routine f/u .  History of Present Illness: 65 yo F well known to me who presents for a checkup.  She feels much better than our last visit.  Her BPs have been running from 100-105/60-70 up to 190/100.  Most of the time it's 110-120/70-80's.  She continues to smoke about 3-4 cigs/day to help with stress relief.  Occasionally she'll go up to a pack a day.  She feels much less fatigued now and is able to walk about 4 miles a day now.  She feels better since a neighbor in her building moved out.  She does use some salt on occasion, but she avoids canned veggies and other salty foods.  She has to use her NTG about 2-3 times a month, most recently about a week ago when she was exerting herself.   She used Chantix, but had terrible nightmares on it so she prefers not to use it again.  She did have some success with nicotine patches and is ready to try them again. Repeat BP 152/94.  Current Allergies (reviewed today): ! * TAPE ! 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        Coronary artery disease, nonobstructive, 30% LAD 9/07 cath       Irritable bowel syndrome  Past Surgical History:    Reviewed history from 10/23/2006 and no changes required:       Appendectomy       Hysterectomy       Percutaneous transluminal coronary angioplasty       Lumpectomy-Right breast areola    Risk Factors:  Tobacco use:  current    Year started:  ABOUT THE AGE OF 10    Cigarettes:  Yes -- varies pack(s) per day    Counseled to quit/cut down tobacco use:  yes Alcohol use:  no Exercise:  yes    Times per week:  3    Type:  WALKING AND AROBIC Seatbelt use:  100 %  PAP Smear History:    Date of Last PAP Smear:  10/23/2006   Review of Systems      See HPI   Physical Exam  General:  Well-developed,well-nourished,in no acute distress; alert,appropriate and cooperative throughout examination Eyes:     EOMI, anicteric Mouth:     mmm Lungs:     CTA B/L Heart:     normal rate, regular rhythm, and no murmur.   Abdomen:     soft, non-tender, and normal bowel sounds.   Extremities:     No C/C/E Neurologic:     alert & oriented X3.   Psych:     Oriented X3.      Impression & Recommendations:  Problem # 1:  FATIGUE (ICD-780.79) The pt returned to f/up on her sx of fatigue.  Her sx seemed to have resolved ever since her neighbor, who was a source of stress, moved out.  The pt is now back to her baseline activity level.  We discussed her need to quit smoking as this may also have be contributing to her feelings of fatigue and she is now in the contemplative stage.  She was instructed to purchase some OTC nicotine patches since the Chantix gave her nightmares.  Of note, all of her labs were WNL.    Problem # 2:  HYPERTENSION (ICD-401.9) The pt checks her BP at home and states it's usually 120/80's or so and only occasionally is higher than that.  She takes all her meds everyday and doesn't miss any doses.  She wants to  try bringing in a log of her BPs in another month before going up on or adding another BP med.  If her BP is still elevated at our next visit, however, I will either increase her BB or add an ACE-I (combo with HCTZ would be best).  We discussed the need for her to stop smoking (see Problem #1) as this will help bring her BP down.  We also discussed what foods to avoid to help lower her BP.  She will RTC in 1-2 months for another BP check.  She needs her annual FLP at our next visit as well. BP today: 150/95 Prior BP: 152/102 (03/15/2007)  Labs Reviewed: Creat: 0.84 (03/15/2007) Chol: 162 (03/31/2006)   HDL: 40 (03/31/2006)   LDL: 80 (03/31/2006)   TG: 209 (03/31/2006)   Complete Medication List: 1)  Hydrochlorothiazide 25 Mg Tabs (Hydrochlorothiazide) .... Take 1 tablet by mouth once a day 2)  Protonix 40 Mg Tbec (Pantoprazole sodium) .... .take 1 tablet by mouth once a day 3)  Pravachol 10 Mg Tabs (Pravastatin sodium) .... Take 1 tablet by mouth once a day 4)  Nitroglycerin 0.4 Mg Subl (Nitroglycerin) .... Place 1 tablet under the tongue every 5 minutes up to 3 times as needed for chest pain 5)  Aspirin 81 Mg Tbec (Aspirin) .... Take 1 tablet by mouth once a day 6)  Metoprolol Tartrate 25 Mg Tabs (Metoprolol tartrate) .... Take 1/2 tablet by mouth two times a day 7)  Hyoscyamine Sulfate 0.125 Mg Subl (Hyoscyamine sulfate) .... Place 1 tablet under the tongue up to every 4 hours a day as needed for bloating 8)  Vicodin 5-500 Mg Tabs (Hydrocodone-acetaminophen) .... Take 1-2 tablet by mouth up to every 6 hours as needed 9)  Cafergot 1-100 Mg Tabs (Ergotamine-caffeine) .... Take 2 tablets at onset of headache and then 1 tablet up to every 30 minutes as needed.  max of 6 tablets per day or 10 tablets per week. 10)  Nicotine 7 Mg/24hr Pt24 (Nicotine) .... Use one patch per day to help you quit smoking.  remove the old patch before placing a new  one.   Patient Instructions: 1)  Please schedule a  follow-up appointment in 1-2 months with Dr. Landis Martins for a blood pressure check. 2)  Purchase some Nicotine patches (either by prescription or over-the-counter) and STOP SMOKING! 3)  Stop Smoking Tips: Choose a Quit date. Cut down before the Quit date. Decide what you will do as a substitute when you feel the urge to smoke (gum, toothpick, exercise). 4)  Check your blood pressure about 3 times a week until our next visit (about every other day or so) and write the readings down in a log and bring it with you to our next visit.    Prescriptions: NICOTINE 7 MG/24HR  PT24 (NICOTINE) Use one patch per day to help you quit smoking.  Remove the old patch before placing a new one.  #30 x 2   Entered and Authorized by:   Chauncey Reading DO   Signed by:   Chauncey Reading DO on 05/14/2007   Method used:   Print then Give to Patient   RxID:   [id number]  ]

## 2010-07-20 NOTE — Progress Notes (Signed)
Summary: refill/gg  Phone Note Refill Request  on February 25, 2009 2:08 PM  Refills Requested: Medication #1:  NITROGLYCERIN 0.4 MG SUBL Place 1 tablet under the tongue every 5 minutes up to 3 times as needed for chest pain  Method Requested: Fax to Local Pharmacy Initial call taken by: Merrie Roof RN,  February 25, 2009 2:08 PM  Follow-up for Phone Call        Rx faxed to pharmacy Follow-up by: Clydie Braun MD,  February 25, 2009 5:04 PM    Prescriptions: NITROGLYCERIN 0.4 MG SUBL (NITROGLYCERIN) Place 1 tablet under the tongue every 5 minutes up to 3 times as needed for chest pain  #25 x 3   Entered by:   Clydie Braun MD   Authorized by:   Marland Kitchen [facility] ATTENDING DESKTOP   Signed by:   Clydie Braun MD on 02/25/2009   Method used:   Faxed to ...       [facility] (retail)       [address]       [place], [place]  [address]       Ph: [phone]       Fax: [phone]   RxID:   [id number]

## 2010-07-20 NOTE — Assessment & Plan Note (Signed)
Summary: FU/ TEST RESULT/(AGNEW) SB.   Vital Signs:  Patient Profile:   65 Years Old Female Height:     62 inches (157.48 cm) Weight:      160.3 pounds (72.86 kg) BMI:     29.43 Temp:     97.0 degrees F (36.11 degrees C) oral Pulse rate:   78 / minute BP sitting:   148 / 88  (right arm)  Pt. in pain?   no  Vitals Entered By: Filomena Jungling NT II (January 14, 2008 9:10 AM)              Is Patient Diabetic? No Nutritional Status BMI of 25 - 29 = overweight  Have you ever been in a relationship where you felt threatened, hurt or afraid?No   Does patient need assistance? Functional Status Self care Ambulation Normal      PCP:  Chauncey Reading, DO   History of Present Illness: 65 year old white female is at the office to discuss the results of her lipid profile obtained on 12/05/07. She was found to have hypercholesteremia and Dr. Landis Martins had requested patient to come to clinic to further discuss the results. At present, patient is feeling well and surprised with higher levels of lipids. Patient suggests that she has been considerate about her food- she avoids  fatty fried food. She exercise irregularly at low level- about one mile a day walk. She is attempting to quit smoking. She did not have a cigarrette for last three days and had one while on the way to clinic due to stress. She was using nicotine patch as prescribed by Dr. Landis Martins and she felt that it helped but her craving came right back upon not having patch. She wants to quit cold [place].   She suggests that she takes her medication regularly. She has yet to start Gemfibrozil as prescribed by Dr. Landis Martins and start Carvidelol.    Updated Prior Medication List: HYDROCHLOROTHIAZIDE 25 MG TABS (HYDROCHLOROTHIAZIDE) Take 1 tablet by mouth once a day PROTONIX 40 MG TBEC (PANTOPRAZOLE SODIUM) .Take 1 tablet by mouth once a day PRAVASTATIN SODIUM 40 MG  TABS (PRAVASTATIN SODIUM) Take 1 tablet by mouth at bedtime NITROGLYCERIN 0.4 MG SUBL  (NITROGLYCERIN) Place 1 tablet under the tongue every 5 minutes up to 3 times as needed for chest pain ASPIRIN 81 MG TBEC (ASPIRIN) Take 1 tablet by mouth once a day HYOSCYAMINE SULFATE 0.125 MG SUBL (HYOSCYAMINE SULFATE) Place 1 tablet under the tongue up to every 4 hours a day as needed for bloating VICODIN 5-500 MG TABS (HYDROCODONE-ACETAMINOPHEN) Take 1-2 tablet by mouth up to every 6 hours as needed CAFERGOT 1-100 MG TABS (ERGOTAMINE-CAFFEINE) Take 2 tablets at onset of headache and then 1 tablet up to every 30 minutes as needed.  Max of 6 tablets per day or 10 tablets per week. NICOTINE 7 MG/24HR  PT24 (NICOTINE) Use one patch per day to help you quit smoking.  Remove the old patch before placing a new one. CARVEDILOL 6.25 MG  TABS (CARVEDILOL) Take 1 tablet by mouth two times a day for your blood pressure GEMFIBROZIL 600 MG  TABS (GEMFIBROZIL) Take 1 tablet by mouth two times a day for your cholesterol  Current Allergies: ! * TAPE ! IODINE  Past Medical History:    Reviewed history from 10/23/2006 and no changes required:       GERD       Hyperlipidemia       Hypertension  Constipation       Chest Pain, atypical with negative cardiac workup (cath 9/07 with nonobstructive CAD)       Hemorrhoids, internal, thrombosed       Tobacco Abuse       Fibroadenoma, right breast-Confirmed by U/S 09/26/06        Coronary artery disease, nonobstructive, 30% LAD 9/07 cath       Irritable bowel syndrome   Family History:    Reviewed history and no changes required:       Family History High cholesterol all sibilings and parents       parents, oldest brother died with heart attack       older sister had triple bypass       one sister died of stomach cancer at age of 70                Social History:    Reviewed history and no changes required:       living my herself, does Agricultural consultant work    Risk Factors: Tobacco use:  current    Year started:  ABOUT THE AGE OF 10    Cigarettes:   Yes -- 1 pck/4 days pack(s) per day Alcohol use:  no Exercise:  no Seatbelt use:  100 %  PAP Smear History:    Date of Last PAP Smear:  10/23/2006   Review of Systems      See HPI   Physical Exam  General:     alert, well-developed, well-nourished, and well-hydrated.   Head:     normocephalic, atraumatic, and no abnormalities palpated.   Eyes:     pupils equal, pupils round, and pupils reactive to light.   Ears:     R ear normal, L ear normal, and no external deformities.   Nose:     no external deformity and no nasal discharge.   Mouth:     no gingival abnormalities, no erythema, and no exudates.   Neck:     supple and full ROM.   Lungs:     normal respiratory effort, no fremitus, no crackles, and no wheezes.   Heart:     normal rate, regular rhythm, no murmur, and no gallop.   Abdomen:     soft, non-tender, and normal bowel sounds.   Msk:     normal ROM.  no muscle aches    Impression & Recommendations:  Problem # 1:  HYPERLIPIDEMIA (ICD-272.4) Pt was informed of her high tirglycerides, LDL and VLDL levels observed in her last lipid profile test (12/05/07). Patient has yet to pick up Gemfibrosil as prescribed by Dr. Landis Martins. Pt was informed that medication is called in and should be ready to pick up. A copy of lab result was provided.  Role of prudent diet, regular exercise and smoking cessation was discussed at length. Pt reports compliance with medication. We re-emphasized need of regularly taking medication.  She is scheduled for a follow up visit after a month. Her lipid profile should be reevaluated in 3 months. She was informed of possible side effects of Gemfibrosil and instructed to call us immediate if she developes musle pains.  Her updated medication list for this problem includes:    Pravastatin Sodium 40 Mg Tabs (Pravastatin sodium) .Marland Kitchen... Take 1 tablet by mouth at bedtime    Gemfibrozil 600 Mg Tabs (Gemfibrozil) .Marland Kitchen... Take 1 tablet by mouth two times a  day for your cholesterol  Labs Reviewed: Chol: 238 (12/07/2007)   HDL:  47 (12/07/2007)   LDL: 135 (12/07/2007)   TG: 279 (12/07/2007) SGOT: 19 (03/15/2007)   SGPT: 10 (03/15/2007)   Problem # 2:  HYPERTENSION (ICD-401.9) Patient is doing better as her BP was measure 148/88 during this visit. Patient has yet to change her medication as Dr. Landis Martins advised from Metoprolol to Carvedilol. Pt plans to make such change in 2-3 days. Patient also was instructed that her medication should be ready to pick up from her pharmacy.  We plan to follow her BP and re-asses her medication next visit.  Her updated medication list for this problem includes:    Hydrochlorothiazide 25 Mg Tabs (Hydrochlorothiazide) .Marland Kitchen... Take 1 tablet by mouth once a day    Carvedilol 6.25 Mg Tabs (Carvedilol) .Marland Kitchen... Take 1 tablet by mouth two times a day for your blood pressure  BP today: 148/88 Prior BP: 157/100 (12/07/2007)  Labs Reviewed: Creat: 0.84 (03/15/2007) Chol: 238 (12/07/2007)   HDL: 47 (12/07/2007)   LDL: 135 (12/07/2007)   TG: 279 (12/07/2007)   Problem # 3:  TOBACCO ABUSE (ICD-305.1) Pt reports smoking a cigerrete only once in last three days. She stats that she was able to cease smoking while on Nicotine patch. However, her craving comes right back upon removing the patch. She would not like to use patch as she feels she being dependant on it. She is scheduled to talk to social worker Liz Malady.  Her updated medication list for this problem includes:    Nicotine 7 Mg/24hr Pt24 (Nicotine) ..... Use one patch per day to help you quit smoking.  remove the old patch before placing a new one.   Complete Medication List: 1)  Hydrochlorothiazide 25 Mg Tabs (Hydrochlorothiazide) .... Take 1 tablet by mouth once a day 2)  Protonix 40 Mg Tbec (Pantoprazole sodium) .... .take 1 tablet by mouth once a day 3)  Pravastatin Sodium 40 Mg Tabs (Pravastatin sodium) .... Take 1 tablet by mouth at bedtime 4)  Nitroglycerin  0.4 Mg Subl (Nitroglycerin) .... Place 1 tablet under the tongue every 5 minutes up to 3 times as needed for chest pain 5)  Aspirin 81 Mg Tbec (Aspirin) .... Take 1 tablet by mouth once a day 6)  Hyoscyamine Sulfate 0.125 Mg Subl (Hyoscyamine sulfate) .... Place 1 tablet under the tongue up to every 4 hours a day as needed for bloating 7)  Vicodin 5-500 Mg Tabs (Hydrocodone-acetaminophen) .... Take 1-2 tablet by mouth up to every 6 hours as needed 8)  Cafergot 1-100 Mg Tabs (Ergotamine-caffeine) .... Take 2 tablets at onset of headache and then 1 tablet up to every 30 minutes as needed.  max of 6 tablets per day or 10 tablets per week. 9)  Nicotine 7 Mg/24hr Pt24 (Nicotine) .... Use one patch per day to help you quit smoking.  remove the old patch before placing a new one. 10)  Carvedilol 6.25 Mg Tabs (Carvedilol) .... Take 1 tablet by mouth two times a day for your blood pressure 11)  Gemfibrozil 600 Mg Tabs (Gemfibrozil) .... Take 1 tablet by mouth two times a day for your cholesterol   Patient Instructions: 1)  Please schedule a follow-up appointment in 1 month. 2)  Limit your Sodium (Salt) intake. 3)  Exercise regularly. 4)  Plan a smoking cessation date- make an appointment for counselling regarding smoking cessation. 5)  Please take your medication regularly- pick up Gemfibrozil from the pharmacy and start Carvedilol 6.25 mg two times a day once Metoprolol runs out. Please bring  your medications next visit to varify drug list. 6)  Please call immediately  if you experience any muschle pains or aches after starting your new medication- Gemfibrosil   ]  Vital Signs:  Patient Profile:   65 Years Old Female Height:     62 inches (157.48 cm) Weight:      160.3 pounds (72.86 kg) BMI:     29.43 Temp:     97.0 degrees F (36.11 degrees C) oral Pulse rate:   78 / minute BP sitting:   148 / 88             Last PAP Result Normal PapHx  Normal (10/23/2006 6:09:35 PM)

## 2010-07-20 NOTE — Progress Notes (Signed)
Summary: test results/ hla  Phone Note Call from Patient   Summary of Call: pt calls for test results would like to speak to dr. Initial call taken by: Marin Roberts RN,  January 29, 2010 6:19 PM  Follow-up for Phone Call        Phone Call Completed Informed patient that CXR was not concerning for any acute process. Patient states she is feeling much better.  Pt needs vicodin refill. Will refill.  Follow-up by: Melida Quitter MD,  February 03, 2010 3:22 PM  Additional Follow-up for Phone Call Additional follow up Details #1::        Phone Call Completed Additional Follow-up by: Marin Roberts RN,  February 10, 2010 3:19 PM    Prescriptions: VICODIN 5-500 MG TABS (HYDROCODONE-ACETAMINOPHEN) as needed  #60 x 0   Entered and Authorized by:   Melida Quitter MD   Signed by:   Melida Quitter MD on 02/03/2010   Method used:   Telephoned to ...       [facility] (retail)       [address]       [place], [place]  [address]       Ph: [phone]       Fax: [phone]   RxID:   [id number]

## 2010-07-20 NOTE — Progress Notes (Signed)
Summary: refill/ hla  Phone Note Refill Request Message from:  Patient on August 01, 2008 3:29 PM  Refills Requested: Medication #1:  HYDROCHLOROTHIAZIDE 25 MG TABS Take 1 tablet by mouth once a day Initial call taken by: Marin Roberts RN,  August 01, 2008 3:30 PM  Follow-up for Phone Call        Rx called to pharmacy Follow-up by: Marin Roberts RN,  August 01, 2008 5:32 PM      Prescriptions: HYDROCHLOROTHIAZIDE 25 MG TABS (HYDROCHLOROTHIAZIDE) Take 1 tablet by mouth once a day  #30 x 5   Entered and Authorized by:   Ned Grace MD   Signed by:   Harriett Sine Phifer MD on 08/01/2008   Method used:   Telephoned to ...       [facility] (retail)       [address]       [place], [place]  [address]       Ph: [phone]       Fax: [phone]   RxID:   [id number]

## 2010-07-20 NOTE — Assessment & Plan Note (Signed)
Summary: BLACK TARRY STOOLS/FH  GI PROBLEM LIST: 1. Recurrent rectal hemorrhoids.  She has been seen several time by     Dr. Carolynne Edouard but it seems like hemorrhoids have always resolved by the     time she sees him.  Colonoscopy by Dr. Vilinda Boehringer in 2006 was     fairly normal except for internal hemorrhoids.  He had arranged for     her to see Dr. Carolynne Edouard. 2. Dyspepsia and reflux symptoms.  EDG May 2007 by me was normal.     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 3. atypical squamous cells anal rectal biopsies during hemorrhoid surgery, Dr. Carolynne Edouard 2008   History of Present Illness Visit Type:  Follow-up Visit Primary GI MD:  Rob Bunting MD Primary MD:  Chauncey Reading, DO Chief Complaint:  colon screening History of Present Illness:    very pleasant 65 year old woman who was recently recommended to have repeat colonoscopy by her PCP.  she had a colonoscopy by Dr. Vilinda Boehringer in 2006, no polyps were found. Was recommended to have a repeat in 2016.  she had dark black stools one to 2 weeks ago, this was after a lot of spinach and collard greens. When she stop eating spinach and collard greens her stools went back to normal. She saw no red blood.  had hemorrhoid surgery with Dr. Carolynne Edouard 1-2 years ago and was told that "something was pre-cancerous".  Pathology from July, 2008 transanal procedure shows:    ANORECTAL JUNCTION, RESECTION:   - HEMORRHOIDS   - DYSPLASTIC SQUAMOUS MUCOSA, SEE COMMENT            Current Medications (verified): 1)  Hydrochlorothiazide 25 Mg Tabs (Hydrochlorothiazide) .... Take 1 Tablet By Mouth Once A Day 2)  Protonix 40 Mg Tbec (Pantoprazole Sodium) .... .take 1 Tablet By Mouth Once A Day 3)  Pravastatin Sodium 40 Mg  Tabs (Pravastatin Sodium) .... Take 1 Tablet By Mouth At Bedtime 4)  Nitroglycerin 0.4 Mg Subl (Nitroglycerin) .... Place 1 Tablet Under The Tongue Every 5 Minutes Up To 3 Times As Needed For Chest Pain 5)  Aspirin 81 Mg Tbec (Aspirin) .... Take 1 Tablet By  Mouth Once A Day 6)  Hyoscyamine Sulfate 0.125 Mg/ml Soln (Hyoscyamine Sulfate) .... Place 1 Drop On Tongue Up To Ever 6 Hours As Needed For Bloating 7)  Cafergot 1-100 Mg Tabs (Ergotamine-Caffeine) .... Take 2 Tablets At Onset of Headache and Then 1 Tablet Up To Every 30 Minutes As Needed.  Max of 6 Tablets Per Day or 10 Tablets Per Week. 8)  Carvedilol 12.5 Mg Tabs (Carvedilol) .... Take 1 Tablet By Mouth Two Times A Day For Your Blood Pressure 9)  Gemfibrozil 600 Mg  Tabs (Gemfibrozil) .... Take 1 Tablet By Mouth Two Times A Day For Your Cholesterol 10)  Ventolin Hfa 108 (90 Base) Mcg/act Aers (Albuterol Sulfate) .... Take 1-2 Puffs Up To Every 4 Hours As Needed For Shortness of Breath, Cough or Wheezing 11)  Vicodin 5-500 Mg Tabs (Hydrocodone-Acetaminophen) .... As Needed  Allergies (verified): 1)  ! * Tape 2)  ! Iodine  Vital Signs:  Patient profile:   65 year old female Height:      62 inches Weight:      155 pounds Pulse rate:   64 / minute Pulse rhythm:   regular BP sitting:   110 / 74  (left arm)  Vitals Entered By: June McMurray CMA (September 02, 2008 2:46 PM)  Physical Exam  Additional Exam:  Constitutional:  generally well appearing Psychiatric: alert and oriented times 3 Abdomen: soft, non-tender, non-distended, normal bowel sounds    Impression & Recommendations:  Problem # 1:  dark stools almost certainly from the spinach and collard greens that she had recently eaten. She will get a basic CBC today just to be safe. She is due for routine colonoscopy in about 5 years and we will make sure she is in our reminder system for that.  Problem # 2:  dysplasia on anorectal biopsies hemorrhoid surgery by Dr. Carolynne Edouard in 2008, biopsies showed dysplastic squamous cells. I will get her a return appointment to see Dr. Carolynne Edouard for repeat anal biopsies. During those endoscopic route would be quite painful.  Other Orders: TLB-CBC Platelet - w/Differential ([id number])  Patient  Instructions: 1)  We will refer you back to Dr. Carolynne Edouard to rebiopsy your anus. 2)  The spinach/collard greens caused your recent black stools. 3)  You will get lab test(s) done today (cbc). 4)  A copy of this information will be sent to Dr. Chauncey Reading. 5)  The medication list was reviewed and reconciled.  All changed / newly prescribed medications were explained.  A complete medication list was provided to the patient / caregiver.  Appended Document: Orders Update/CCS    Clinical Lists Changes  Orders: Added new Test order of [facility] ([facility]) - Signed

## 2010-07-20 NOTE — Progress Notes (Signed)
Summary: med refill/wl  Phone Note Refill Request Message from:  Fax from Pharmacy on July 25, 2006 2:01 PM  Refills Requested: Medication #1:  PRAVACHOL 10 MG TABS Take 1 tablet by mouth once a day   Dosage confirmed as above?Dosage Confirmed   Last Refilled: 06/24/2006   Notes: [facility] 06/28/06 Initial call taken by: Dorene Sorrow RN,  July 25, 2006 2:03 PM  Follow-up for Phone Call        Refill approved-nurse to complete Follow-up by: Ellie Lunch MD,  July 25, 2006 2:38 PM  Additional Follow-up for Phone Call Additional follow up Details #1::        Rx faxed to pharmacy Additional Follow-up by: Dorene Sorrow RN,  July 25, 2006 3:05 PM    Prescriptions: PRAVACHOL 10 MG TABS (PRAVASTATIN SODIUM) Take 1 tablet by mouth once a day  #30 x 5   Entered and Authorized by:   Ellie Lunch MD   Signed by:   Ellie Lunch MD on 07/25/2006   Method used:   Telephoned to ...         RxID:   [id number]

## 2010-07-20 NOTE — Assessment & Plan Note (Signed)
Summary: EST-1 MONTH RECHECK/CH   Vital Signs:  Patient profile:   65 year old female Height:      62 inches (157.48 cm) Weight:      159.4 pounds (70.86 kg) BMI:     28.62 Temp:     97.8 degrees F (36.56 degrees C) oral Pulse rate:   77 / minute BP sitting:   152 / 93  (right arm) Cuff size:   regular  Vitals Entered By: Theotis Barrio NT II (October 07, 2009 9:06 AM) CC: MEDICATION REFILL /  BILATERAL EAR PAIN FOR ABOUT 6 MONTHS Is Patient Diabetic? No Pain Assessment Patient in pain? yes     Location: EARS Intensity:        8 Type: dull Onset of pain  FOR ABOUT 6 MONTHS Nutritional Status BMI of 25 - 29 = overweight  Have you ever been in a relationship where you felt threatened, hurt or afraid?No   Does patient need assistance? Functional Status Self care Ambulation Normal Comments MEDICATION REFILL /  BILATERAL EAR PAIN FOR ABOUT 6 MONTHS.   Primary Care Provider:  Chauncey Reading, DO  CC:  MEDICATION REFILL /  BILATERAL EAR PAIN FOR ABOUT 6 MONTHS.  History of Present Illness: 65 y/o female with PMH of:CAD, HTN, Hyperlipidemia, and GERD and HA.  Patient [facility] for [facility] up.   Additionally, patient complains of bilateral ear ringing. She describes a buzzing noise, and notes this noise to cause headaches. Patient states the noise awakes her from sleep. She associates occasional dizziness, but denies ever falling. For headache, pt takes Aleve and when very severe takes half of vicodin. She states the onset of rining in her ears is associated with the noise [place] trains[place]'s been living in the housing complex for several years, however notes the noise of trains to have recently gotten worse. She also complains of difficulty sleeping due to her ear pain. No association of ear pain with head movement or changes in body position.   Patient denies CP, SOB, cough, HA, abdominal pain, N/V, changes in bowel  habits, and urinary symptoms. No additional complaints or concerns today.   Pt needs refill on medications.      Current Medications (verified): 1)  Hydrochlorothiazide 25 Mg Tabs (Hydrochlorothiazide) .... Take 1 Tablet By Mouth Once A Day 2)  Pravastatin Sodium 40 Mg  Tabs (Pravastatin Sodium) .... Take 1 Tablet By Mouth At Bedtime 3)  Nitroglycerin 0.4 Mg Subl (Nitroglycerin) .... Place 1 Tablet Under The Tongue Every 5 Minutes Up To 3 Times As Needed For Chest Pain 4)  Aspirin 81 Mg Tbec (Aspirin) .... Take 1 Tablet By Mouth Once A Day 5)  Hyoscyamine Sulfate 0.125 Mg/ml Soln (Hyoscyamine Sulfate) .... Place 1 Drop On Tongue Up To Ever 6 Hours As Needed For Bloating 6)  Cafergot 1-100 Mg Tabs (Ergotamine-Caffeine) .... Take 2 Tablets At Onset of Headache and Then 1 Tablet Up To Every 30 Minutes As Needed.  Max of 6 Tablets Per Day or 10 Tablets Per Week. 7)  Carvedilol 12.5 Mg Tabs (Carvedilol) .... Take 1 Tablet By Mouth Two Times A Day For Your Blood Pressure 8)  Ventolin Hfa 108 (90 Base) Mcg/act Aers (Albuterol Sulfate) .... Take 1-2 Puffs Up To Every 4 Hours As Needed For Shortness of Breath, Cough or Wheezing 9)  Vicodin 5-500 Mg Tabs (Hydrocodone-Acetaminophen) .... As Needed 10)  Omeprazole 40 Mg Cpdr (Omeprazole) .Marland KitchenMarland KitchenMarland Kitchen  Take 1 Tablet By Mouth Once A Day For Acid Reflux  Allergies (verified): 1)  ! * Tape 2)  ! Iodine  Review of Systems      See HPI  Physical Exam  General:  alert and well-developed.   Head:  normocephalic and atraumatic.   Eyes:  vision grossly intact, pupils equal, pupils round, and pupils reactive to light.   Ears:  R ear normal, L ear normal, and no external deformities.   Nose:  no external deformity, no external erythema, and no nasal discharge.   Mouth:  good dentition, no gingival abnormalities, no dental plaque, and pharynx pink and moist.   Neck:  supple, full ROM, and no masses.   Lungs:  normal respiratory effort and normal breath sounds.     Heart:  normal rate, regular rhythm, no murmur, no gallop, no rub, and no JVD.   Abdomen:  soft, non-tender, normal bowel sounds, no distention, no masses, no guarding, and no rigidity.   Pulses:  R radial normal and L radial normal.   Extremities:  no C/C/edema Neurologic:  alert & oriented X3, cranial nerves II-XII intact, strength normal in all extremities, sensation intact to light touch, sensation intact to pinprick, gait normal, and heel-to-shin normal.     Impression & Recommendations:  Problem # 1:  HYPERTENSION (ICD-401.9) Assessment Unchanged Blood pressure remains unchanged from prior reading. I suspect her elevated blood pressure stems from her constant stressful living situation where she feels unsafe, along with noise from trains that is causing lack of sleep. I will fill out the appropriate forms to have patient transferred to a better living facility, as well as provide a letter to the housing complex. I will not make adjustments to anti-hypertensives today, and reassess once she is relocated. Pt does endorse well controlled blood pressure when she is at her daughter's house. If blood pressure continues to stay elevated, I would consider increasing the Coreg to 25 mg two times a day.   Her updated medication list for this problem includes:    Hydrochlorothiazide 25 Mg Tabs (Hydrochlorothiazide) .Marland Kitchen... Take 1 tablet by mouth once a day    Carvedilol 12.5 Mg Tabs (Carvedilol) .Marland Kitchen... 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 There is no other associated symptoms such as dizziness, gait instability, or hearing loss. Ear canals appeared normal, tympanic  membrane visualized, no other external deformaties. No other focal neurologic deficits. Will refer pt to Audiologist for further work-up in terms of hearing tests.   Orders: Audiology (Audio)  Problem # 3:  HYPERLIPIDEMIA (ICD-272.4) Assessment: Deteriorated Will continue current statin at current dose, and will check another FLP in 3-6 months. LDL and total cholesterol is slightly elevated, however patient notes to have been eating healthier and would not like to increase Pravastatin at this time.   Her updated medication list for this problem includes:    Pravastatin Sodium 40 Mg Tabs (Pravastatin sodium) .Marland Kitchen... Take 1 tablet by mouth at bedtime  Labs Reviewed: SGOT: 28 (01/14/2009)   SGPT: 18 (01/14/2009)   HDL:39 (07/24/2009), 54 (07/10/2008)  LDL:102 (07/24/2009), 88 (81/19/1478)  Chol:195 (07/24/2009), 165 (07/10/2008)  Trig:268 (07/24/2009), 114 (  07/10/2008)  Problem # 4:  CORONARY ARTERY DISEASE (ICD-414.00) Assessment: Comment Only No recent chest pain. Visits cardiologist as needed basis now. Will continue current meds and continue to monitor with medical management.  Her updated medication list for this problem includes:    Hydrochlorothiazide 25 Mg Tabs (Hydrochlorothiazide) .Marland Kitchen... Take 1 tablet by mouth once a day    Nitroglycerin 0.4 Mg Subl (Nitroglycerin) .Marland Kitchen... Place 1 tablet under the tongue every 5 minutes up to 3 times as needed for chest pain    Aspirin 81 Mg Tbec (Aspirin) .Marland Kitchen... Take 1 tablet by mouth once a day    Carvedilol 12.5 Mg Tabs (Carvedilol) .Marland Kitchen... Take 1 tablet by mouth two times a day for your blood pressure  Labs Reviewed: Chol: 195 (07/24/2009)   HDL: 39 (07/24/2009)   LDL: 102 (07/24/2009)   TG: 268 (07/24/2009)  Complete Medication List: 1)  Hydrochlorothiazide 25 Mg Tabs (Hydrochlorothiazide) .... Take 1 tablet by mouth once a day 2)  Pravastatin Sodium 40 Mg Tabs (Pravastatin sodium) .... Take 1 tablet by mouth at bedtime 3)  Nitroglycerin 0.4 Mg  Subl (Nitroglycerin) .... Place 1 tablet under the tongue every 5 minutes up to 3 times as needed for chest pain 4)  Aspirin 81 Mg Tbec (Aspirin) .... Take 1 tablet by mouth once a day 5)  Hyoscyamine Sulfate 0.125 Mg/ml Soln (Hyoscyamine sulfate) .... Place 1 drop on tongue up to ever 6 hours as needed for bloating 6)  Cafergot 1-100 Mg Tabs (Ergotamine-caffeine) .... Take 2 tablets at onset of headache and then 1 tablet up to every 30 minutes as needed.  max of 6 tablets per day or 10 tablets per week. 7)  Carvedilol 12.5 Mg Tabs (Carvedilol) .... Take 1 tablet by mouth two times a day for your blood pressure 8)  Ventolin Hfa 108 (90 Base) Mcg/act Aers (Albuterol sulfate) .... Take 1-2 puffs up to every 4 hours as needed for shortness of breath, cough or wheezing 9)  Vicodin 5-500 Mg Tabs (Hydrocodone-acetaminophen) .... As needed 10)  Omeprazole 40 Mg Cpdr (Omeprazole) .... Take 1 tablet by mouth once a day for acid reflux  Patient Instructions: 1)  Please refer to the Audiologist as scheduled. 2)  Please take all medications as prescribed. 3)  Please contact clinic for any further questions or concerns regarding living arrangements.  4)  Please follow up in 3-6 months.    Prevention & Chronic Care Immunizations   Influenza vaccine: Fluvax 3+  (04/14/2008)   Influenza vaccine deferral: Refused  (07/24/2009)    Tetanus booster: Not documented    Pneumococcal vaccine: Not documented    H. zoster vaccine: Not documented  Colorectal Screening   Hemoccult: Not documented    Colonoscopy: Done by Dr. Rob Bunting (08/09/2004) no polyps, masses or diverticular disease noted repeat in 10 years  (08/09/2004)  Other Screening   Pap smear: Normal  (10/23/2006)   Pap smear action/deferral: Not indicated S/P hysterectomy  (01/14/2009)    Mammogram: ASSESSMENT: Negative - BI-RADS 1^MM DIGITAL SCREENING  (07/30/2009)   Mammogram action/deferral: Ordered  (07/24/2009)   Mammogram due:  04/2009    DXA bone density scan: Not documented   Smoking status: current  (07/24/2009)   Smoking cessation counseling: yes  (07/24/2009)  Lipids   Total Cholesterol: 195  (07/24/2009)   LDL: 102  (07/24/2009)   LDL Direct: Not documented   HDL: 39  (07/24/2009)   Triglycerides: 268  (07/24/2009)    SGOT (AST): 28  (01/14/2009)  SGPT (ALT): 18  (01/14/2009)   Alkaline phosphatase: 63  (01/14/2009)   Total bilirubin: 0.5  (01/14/2009)    Lipid flowsheet reviewed?: Yes   Progress toward LDL goal: Deteriorated  Hypertension   Last Blood Pressure: 152 / 93  (10/07/2009)   Serum creatinine: 0.76  (07/24/2009)   BMP action: Ordered   Serum potassium 4.9  (07/24/2009)    Hypertension flowsheet reviewed?: Yes   Progress toward BP goal: Unchanged  Self-Management Support :   Personal Goals (by the next clinic visit) :      Personal blood pressure goal: 130/80  (10/07/2009)     Personal LDL goal: 70  (10/07/2009)    Patient will work on the following items until the next clinic visit to reach self-care goals:     Medications and monitoring: take my medicines every day  (10/07/2009)     Eating: eat more vegetables, use fresh or frozen vegetables, eat foods that are low in salt, eat baked foods instead of fried foods, eat fruit for snacks and desserts, limit or avoid alcohol  (10/07/2009)     Activity: take a 30 minute walk every day  (10/07/2009)    Hypertension self-management support: BP self-monitoring log, Written self-care plan  (10/07/2009)   Hypertension self-care plan printed.    Lipid self-management support: Resources for patients handout  (10/07/2009)        Resource handout printed.

## 2010-07-20 NOTE — Assessment & Plan Note (Signed)
Summary: EST-CK/FU/MEDS/CFB   Vital Signs:  Patient Profile:   65 Years Old Female Weight:      155.01 pounds Temp:     97.7 degrees F oral Pulse rate:   68 / minute  Pt. in pain?   no  Vitals Entered By: Angelina Ok RN (July 10, 2006 8:59 AM)              Is Patient Diabetic? No Nutritional Status Normal  Have you ever been in a relationship where you felt threatened, hurt or afraid?No   Does patient need assistance? Functional Status Self care Ambulation Normal      Chief Complaint:  Follow up for  stomach.  History of Present Illness: This is a 65 year old woman, well-known to me, who presents today for f/up regarding her thrombosed internal hemorrhoid.  She was seen by Dr. Christella Hartigan who sent her to Dr. Earlene Plater, a general surgeon for consideration for a hemorrhoidectomy.  Dr. Earlene Plater agreed that she needs to have surgical removal of the internal hemorrhoids.  He set her up to see Dr. Carolynne Edouard on 07/18/06 for a consultation for a new "staple" technique to correct the hemorrhoid.  Dr. Christella Hartigan gave her Analpram 2.5% cream to apply two times a day to the affected area and she's had some relief wih that.  She states it's only sticking out about 1/2 in now, whereas before she's had part of her rectum prolapse it's been so bad.  No bleeding, but also no BM in 4 days.  She's also not eating well because she feels bloated when she eats.  She's very uncomfortable and has to wear larger clothes to be more comfortable 2/2 the bloating.    Prior Medications: HYDROCHLOROTHIAZIDE 25 MG TABS (HYDROCHLOROTHIAZIDE) Take 1 tablet by mouth once a day PROTONIX 40 MG TBEC (PANTOPRAZOLE SODIUM) .Take 1 tablet by mouth once a day PRAVACHOL 10 MG TABS (PRAVASTATIN SODIUM) Take 1 tablet by mouth once a day NITROGLYCERIN 0.4 MG SUBL (NITROGLYCERIN) Place 1 tablet under the tongue every 5 minutes up to 3 times as needed for chest pain ASPIRIN 81 MG TBEC (ASPIRIN) Take 1 tablet by mouth once a day METOPROLOL  TARTRATE 25 MG TABS (METOPROLOL TARTRATE) Ttake 1/2 tablet by mouth two times a day ANALPRAM-HC 1-2.5 % LOTN (HYDROCORTISONE ACE-PRAMOXINE) Apply two times a day to affected area Current Allergies: ! * TAPE ! IODINE  Past Medical History:    GERD    Hyperlipidemia    Hypertension    Constipation    Chest Pain, atypical with negative cardiac workup (cath 9/07 with nonobstructive CAD)    Hemorrhoids, internal, thrombosed    Tobacco Abuse    Fibroadenoma, right breast    Coronary artery disease, nonobstructive, 30% LAD 9/07 cath    Irritable bowel syndrome    Risk Factors:  Tobacco use:  current    Cigarettes:  Yes -- 1/4 pack(s) per day    Counseled to quit/cut down tobacco use:  yes   Review of Systems      See HPI   Physical Exam  General:     Well-developed,well-nourished,in no acute distress; alert,appropriate and cooperative throughout examination Abdomen:     bloated appearing, but soft and BS are present, TTP in the midepigastrum    Impression & Recommendations:  Problem # 1:  HEMORRHOIDS (ICD-455.6) The pt is to see Dr. Carolynne Edouard on 07/18/06 for a surgical consultation regarding her thrombosed internal hemorrhoid.  She will follow-up with me 2 weeks after  she has the procedure.  She is not anemic, by labs drawn at our last visit.  She states that the Analpram is working well to keep the internal hemorrhoids under control.    Problem # 2:  CONSTIPATION (ICD-564.00) I think this pt has a component of irritable bowel syndrome.  She complains of bloating and constipation with episodes of loose stools in between.  I will start her on hyoscyamine 0.125mg  q4h prn #180 with 3RF today to help with her sx of bloating.   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  This will be scheduled at our next visit as April gets closer.  Medications Added to  Medication List This Visit: 1)  Analpram-hc 1-2.5 % Lotn (Hydrocortisone ace-pramoxine) .... Apply two times a day to affected area 2)  Hyoscyamine Sulfate 0.125 Mg Subl (Hyoscyamine sulfate) .... Place 1 tablet under the tongue up to every 4 hours a day as needed for bloating   Patient Instructions: 1)  Please schedule a follow-up appointment 2 weeks after you have your procedure by Dr. Carolynne Edouard. 2)  Take the hyoscyamine up to every 4 hours as needed for bloating.           Prescription for Hyoscyamine 0.125 mg called to [facility] per order of Dr. Landis Martins.  Message left on pharmacy line. ..................................................................Marland KitchenAngelina Ok RN  July 10, 2006 9:54 AM

## 2010-07-20 NOTE — Letter (Signed)
Summary: VERIFICATION OF ELIGIBILITY   VERIFICATION OF ELIGIBILITY   Imported By: Margie Billet 11/19/2009 14:45:15  _____________________________________________________________________  External Attachment:    Type:   Image     Comment:   External Document

## 2010-07-20 NOTE — Consult Note (Signed)
Summary: [facility] THRO[facility]   EAR,NOSE,AND THROAT   Imported By: Louretta Parma 02/25/2010 11:46:27  _____________________________________________________________________  External Attachment:    Type:   Image     Comment:   External Document

## 2010-07-20 NOTE — Progress Notes (Signed)
Summary: Refill/gh  Phone Note Refill Request Message from:  Fax from Pharmacy  Refills Requested: Medication #1:  GEMFIBROZIL 600 MG  TABS Take 1 tablet by mouth two times a day for your cholesterol   Last Refilled: 09/23/2008  Method Requested: Fax to Local Pharmacy Initial call taken by: Angelina Ok RN,  December 01, 2008 12:40 PM  Follow-up for Phone Call        Refill approved-nurse to complete Follow-up by: Chauncey Reading DO,  December 01, 2008 2:11 PM  Additional Follow-up for Phone Call Additional follow up Details #1::        Rx faxed to pharmacy Additional Follow-up by: Angelina Ok RN,  December 02, 2008 2:44 PM      Prescriptions: GEMFIBROZIL 600 MG  TABS (GEMFIBROZIL) Take 1 tablet by mouth two times a day for your cholesterol  #60 x 3   Entered and Authorized by:   Chauncey Reading DO   Signed by:   Chauncey Reading DO on 12/01/2008   Method used:   Telephoned to ...       [facility] (retail)       [address]       [place], [place]  [address]       Ph: [phone]       Fax: [phone]   RxID:   [id number]

## 2010-07-20 NOTE — Progress Notes (Signed)
Summary: refill/ hla  Phone Note Refill Request Message from:  Fax from Pharmacy on April 14, 2009 10:48 AM  Refills Requested: Medication #1:  HYDROCHLOROTHIAZIDE 25 MG TABS Take 1 tablet by mouth once a day   Last Refilled: 9/8 Initial call taken by: Marin Roberts RN,  April 14, 2009 10:49 AM  Follow-up for Phone Call        Refill approved-nurse to complete. Follow-up by: Margarito Liner MD,  April 14, 2009 11:23 AM  Additional Follow-up for Phone Call Additional follow up Details #1::        Rx faxed to pharmacy Additional Follow-up by: Angelina Ok RN,  April 16, 2009 12:32 PM    Prescriptions: HYDROCHLOROTHIAZIDE 25 MG TABS (HYDROCHLOROTHIAZIDE) Take 1 tablet by mouth once a day  #30 x 5   Entered and Authorized by:   Margarito Liner MD   Signed by:   Margarito Liner MD on 04/14/2009   Method used:   Telephoned to ...       [facility] (retail)       [address]       [place], [place]  [address]       Ph: [phone]       Fax: [phone]   RxID:   [id number]

## 2010-07-20 NOTE — Progress Notes (Signed)
Summary: refill/gg  Phone Note Refill Request  on May 28, 2008 2:42 PM  Refills Requested: Medication #1:  GEMFIBROZIL 600 MG  TABS Take 1 tablet by mouth two times a day for your cholesterol   Last Refilled: 04/23/2008  Method Requested: Fax to Local Pharmacy Initial call taken by: Merrie Roof RN,  May 28, 2008 2:42 PM  Follow-up for Phone Call        Refill approved-nurse to complete Follow-up by: Chauncey Reading DO,  May 28, 2008 6:28 PM  Additional Follow-up for Phone Call Additional follow up Details #1::        Rx faxed to pharmacy Additional Follow-up by: Merrie Roof RN,  May 30, 2008 10:08 AM      Prescriptions: GEMFIBROZIL 600 MG  TABS (GEMFIBROZIL) Take 1 tablet by mouth two times a day for your cholesterol  #60 x 3   Entered and Authorized by:   Chauncey Reading DO   Signed by:   Chauncey Reading DO on 05/28/2008   Method used:   Telephoned to ...       [facility] (retail)       [address]       [place], [place]  [address]       Ph: [phone]       Fax: [phone]   RxID:   [id number]

## 2010-07-20 NOTE — Assessment & Plan Note (Signed)
Summary: CHECKUP/ SB.   Vital Signs:  Patient Profile:   65 Years Old Female Height:     62 inches (157.48 cm) Weight:      160.1 pounds (72.77 kg) BMI:     29.39 Temp:     97.6 degrees F oral Pulse rate:   78 / minute BP sitting:   132 / 91  (left arm)  Pt. in pain?   yes    Location:   chest    Intensity:   no pain    Type:       "squeezing"  Vitals Entered By: Chinita Pester RN (August 28, 2007 10:42 AM)              Is Patient Diabetic? No Nutritional Status BMI of 25 - 29 = overweight  Have you ever been in a relationship where you felt threatened, hurt or afraid?No   Does patient need assistance? Functional Status Self care Ambulation Normal     PCP:  Chauncey Reading, DO  Chief Complaint:  check-up; BP recheck.  History of Present Illness: 65 yo F who presents for a checkup.  She has chest tightness that happens everyday.  It happens at rest and when she walks around.  She gets lightheaded and has palpitations when she bends over.  She gets palpitations that are worse when she exerts herself.  Her BP goes up as well.  The chest pressure can wake her up at times and radiates to her neck.  She has a hx of an MI in the past (per her report, no records available as it was in [place]).  She thinks the pressure she has now feels like her previous MIs.  She takes her NTG only when she really need to (she doesn't like the HA afterwards).  She gets SOB occasionally with the pressure/tightness, denies diaphoresis, occasionally has nausea.  She knows this pressure is different from her GERD.  She sometimes feels like she's going to pass out, especially when she's walking and has the tightness. Of note, she was in class (which happened to be at an EMS station) on Feb 12th.  She got a HA and went to go to the BR and when she got back her classmates said that she looked pale and it sounded like she had slurred speech.  They ran a rhythm strip and asked her to go to the ED for evaluation, but  she refused to go.      Prior Medication List:  HYDROCHLOROTHIAZIDE 25 MG TABS (HYDROCHLOROTHIAZIDE) Take 1 tablet by mouth once a day PROTONIX 40 MG TBEC (PANTOPRAZOLE SODIUM) .Take 1 tablet by mouth once a day PRAVACHOL 10 MG TABS (PRAVASTATIN SODIUM) Take 1 tablet by mouth once a day NITROGLYCERIN 0.4 MG SUBL (NITROGLYCERIN) Place 1 tablet under the tongue every 5 minutes up to 3 times as needed for chest pain ASPIRIN 81 MG TBEC (ASPIRIN) Take 1 tablet by mouth once a day METOPROLOL TARTRATE 25 MG TABS (METOPROLOL TARTRATE) Take 1/2 tablet by mouth two times a day HYOSCYAMINE SULFATE 0.125 MG SUBL (HYOSCYAMINE SULFATE) Place 1 tablet under the tongue up to every 4 hours a day as needed for bloating VICODIN 5-500 MG TABS (HYDROCODONE-ACETAMINOPHEN) Take 1-2 tablet by mouth up to every 6 hours as needed CAFERGOT 1-100 MG TABS (ERGOTAMINE-CAFFEINE) Take 2 tablets at onset of headache and then 1 tablet up to every 30 minutes as needed.  Max of 6 tablets per day or 10 tablets per week. NICOTINE 7  MG/24HR  PT24 (NICOTINE) Use one patch per day to help you quit smoking.  Remove the old patch before placing a new one.   Current Allergies (reviewed today): ! * TAPE ! 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        Coronary artery disease, nonobstructive, 30% LAD 9/07 cath       Irritable bowel syndrome  Past Surgical History:    Reviewed history from 10/23/2006 and no changes required:       Appendectomy       Hysterectomy       Percutaneous transluminal coronary angioplasty       Lumpectomy-Right breast areola    Risk Factors:  Tobacco use:  current     Year started:  ABOUT THE AGE OF 10    Cigarettes:  Yes -- 1 pck/4 days pack(s) per day    Counseled to quit/cut down tobacco use:  yes Alcohol use:  no Exercise:  no Seatbelt use:  100 %  PAP Smear History:    Date of Last PAP Smear:  10/23/2006   Review of Systems      See HPI   Physical Exam  General:     Well-developed,well-nourished,in no acute distress; alert,appropriate and cooperative throughout examination, pale Eyes:     EOMI, anicteric Mouth:     mmm, OP clear Neck:     supple, full ROM, no masses, and no JVD.   Lungs:     Clear Heart:     normal rate, regular rhythm, and no murmur.   Abdomen:     soft, non-tender, and normal bowel sounds.   Pulses:     R radial normal and L radial normal.   Extremities:     No edema Neurologic:     alert & oriented X3.   Skin:     Intact without suspicious lesions or rashes Psych:     Appropriate    Impression & Recommendations:  Problem # 1:  CHEST DISCOMFORT (ICD-786.59) The pt was admitted for the same sx Sept 07.  She had a cath at that time which revealed some nonobstructive CAD.  Since she has sx that are concerning for [place] vs ACS, I spoke to Dr. Okey Dupre about the pt and he and I agreed that she should be admitted if there were any EKG changes.  She does have a new Q wave in III and AVF looks different compared to her old EKG as well, so we decided she should be admitted for further evaluation even though her sx have been going on for a while.  She has significant RF for CAD including her age, htn, HL, and tobacco abuse.  Pt will be a direct admit from home. Orders: 12 Lead EKG (12 Lead EKG) T-Comprehensive Metabolic Panel ([id number]) T-CBC w/Diff ([id number]) T-Lipid Profile [phone]) T-TSH [id number]) T-Troponin I ([id number]) T-CK MB Fract ([id number])   Complete Medication List: 1)  Hydrochlorothiazide 25 Mg Tabs (Hydrochlorothiazide) .... Take 1 tablet by mouth once a day 2)  Protonix 40 Mg  Tbec (Pantoprazole sodium) .... .take 1 tablet by mouth once a day 3)  Pravachol 10 Mg Tabs (Pravastatin sodium) .... Take 1 tablet by mouth once a day 4)  Nitroglycerin 0.4 Mg Subl (Nitroglycerin) .... Place 1 tablet under the tongue every 5 minutes up to 3 times as needed for chest pain 5)  Aspirin 81 Mg Tbec (Aspirin) .... Take 1 tablet by mouth once a day 6)  Metoprolol Tartrate 25 Mg Tabs (Metoprolol tartrate) .... Take 1/2 tablet by mouth two times a day 7)  Hyoscyamine Sulfate 0.125 Mg Subl (Hyoscyamine sulfate) .... Place 1 tablet under the tongue up to every 4 hours a day as needed for bloating 8)  Vicodin 5-500 Mg Tabs (Hydrocodone-acetaminophen) .... Take 1-2 tablet by mouth up to every 6 hours as needed 9)  Cafergot 1-100 Mg Tabs (Ergotamine-caffeine) .... Take 2 tablets at onset of headache and then 1 tablet up to every 30 minutes as needed.  max of 6 tablets per day or 10 tablets per week. 10)  Nicotine 7 Mg/24hr Pt24 (Nicotine) .... Use one patch per day to help you quit smoking.  remove the old patch before placing a new one.   Patient Instructions: 1)  Please come back to the hospital for a direct admission today when someone from bed control calls you.  If you do not hear from bed control within 2-3 hours, call the Hospital Operator [phone]) and ask for bed control (tell them you are going to be directly admitted). 2)  Pt admitted as a direct admit from home.  Patient Placement called  for Tele bed ; pt. will be a direct admission.  They will call her at home [phone]. Chinita Pester 08/28/07 at 12:57 P.M.  ]

## 2010-07-20 NOTE — Assessment & Plan Note (Signed)
Summary: checkup [mkj]   Vital Signs:  Patient Profile:   65 Years Old Female Height:     62 inches (157.48 cm) Weight:      161.2 pounds (73.27 kg) BMI:     29.59 Temp:     97.6 degrees F (36.44 degrees C) oral Pulse rate:   76 / minute BP sitting:   157 / 100  (left arm) Cuff size:   large  Pt. in pain?   no  Vitals Entered By: Krystal Eaton Duncan Dull) (December 07, 2007 2:58 PM)              Is Patient Diabetic? No Nutritional Status BMI of 25 - 29 = overweight  Have you ever been in a relationship where you felt threatened, hurt or afraid?No   Does patient need assistance? Functional Status Self care Ambulation Normal     PCP:  Chauncey Reading, DO  Chief Complaint:  routine f/u, weight gain, "feels bloated", and med refill.  History of Present Illness: 65 yo F who presents for a checkup.  She has been having some bloating recently.  She was taking hyoscyamine, but hasn't had any in about 3 weeks.  She has noticed that it has been worse since she's been out of the hyoscyamine.  She is also out of her migraine meds - they help a lot.  She is also wondering if she can havea RF on the Vicodin she got a long time ago for her back pain and would like a RF because it helps her pain when she needs it, which is rarely. She has not noticed any change in her stool with the bloating.  She has noticed alternating diarrhea with constipation.  This past month, though, she had 4 days of diarrhea with nausea and fever.  That has resolved. She continues to smoke, but is interested in quitting.  Has cut back significantly.    Prior Medications Reviewed Using: Medication Bottles  Updated Prior Medication List: HYDROCHLOROTHIAZIDE 25 MG TABS (HYDROCHLOROTHIAZIDE) Take 1 tablet by mouth once a day PROTONIX 40 MG TBEC (PANTOPRAZOLE SODIUM) .Take 1 tablet by mouth once a day PRAVASTATIN SODIUM 40 MG  TABS (PRAVASTATIN SODIUM) Take 1 tablet by mouth at bedtime NITROGLYCERIN 0.4 MG SUBL  (NITROGLYCERIN) Place 1 tablet under the tongue every 5 minutes up to 3 times as needed for chest pain ASPIRIN 81 MG TBEC (ASPIRIN) Take 1 tablet by mouth once a day METOPROLOL TARTRATE 25 MG TABS (METOPROLOL TARTRATE) Take 1/2 tablet by mouth two times a day HYOSCYAMINE SULFATE 0.125 MG SUBL (HYOSCYAMINE SULFATE) Place 1 tablet under the tongue up to every 4 hours a day as needed for bloating VICODIN 5-500 MG TABS (HYDROCODONE-ACETAMINOPHEN) Take 1-2 tablet by mouth up to every 6 hours as needed CAFERGOT 1-100 MG TABS (ERGOTAMINE-CAFFEINE) Take 2 tablets at onset of headache and then 1 tablet up to every 30 minutes as needed.  Max of 6 tablets per day or 10 tablets per week. NICOTINE 7 MG/24HR  PT24 (NICOTINE) Use one patch per day to help you quit smoking.  Remove the old patch before placing a new one.  Current Allergies (reviewed today): ! * TAPE ! 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        Coronary artery disease, nonobstructive, 30% LAD 9/07 cath       Irritable bowel syndrome  Past Surgical History:    Reviewed history from 10/23/2006 and no changes required:       Appendectomy       Hysterectomy       Percutaneous transluminal coronary angioplasty       Lumpectomy-Right breast areola    Risk Factors:  Tobacco use:  current    Year started:  ABOUT THE AGE OF 65    Cigarettes:  Yes -- 1 pck/4 days pack(s) per day    Counseled to quit/cut down tobacco use:  yes Alcohol use:  no Exercise:  no Seatbelt use:  100 %  PAP Smear History:    Date of Last PAP Smear:  10/23/2006   Review of Systems      See HPI   Physical Exam  General:      Well-developed,well-nourished,in no acute distress; alert,appropriate and cooperative throughout examination Eyes:     EOMI, anicteric Mouth:     mmm Neck:     supple.   Lungs:     normal respiratory effort, normal breath sounds, no crackles, and no wheezes.   Heart:     normal rate, regular rhythm, no murmur, no gallop, and no rub.   Abdomen:     soft, non-tender, normal bowel sounds, no masses, no guarding, no rigidity, no rebound tenderness, and distended.   Pulses:     R radial normal and L radial normal.   Extremities:     No edema Neurologic:     alert & oriented X3.   Skin:     Intact without suspicious lesions or rashes Cervical Nodes:     no anterior cervical adenopathy and no posterior cervical adenopathy.   Axillary Nodes:     no R axillary adenopathy and no L axillary adenopathy.   Psych:     Appropriate    Impression & Recommendations:  Problem # 1:  IRRITABLE BOWEL, PREDOMINANTLY CONSTIPATION (ICD-564.1) The pt has been worked up for this with a colonoscopy.  She has been out of her hyoscyamine which she states helps her tremendously with her abdmonial distention and gas.  She was given a RX for this today.  Problem # 2:  HYPERTENSION (ICD-401.9) BP not at goal.  Will change Metoprolol to Coreg for better BP control without affecting her HR as much.  She is to continue on the HCTZ as well.  She will RTC in 1 month for a BP check to give her time to use up the rest of her Metoprolol and to start the Coreg.  The following medications were removed from the medication list:    Metoprolol Tartrate 25 Mg Tabs (Metoprolol tartrate) .Marland Kitchen... Take 1/2 tablet by mouth two times a day  Her updated medication list for this problem includes:    Hydrochlorothiazide 25 Mg Tabs (Hydrochlorothiazide) .Marland Kitchen... Take 1 tablet by mouth once a day    Carvedilol 6.25 Mg Tabs (Carvedilol) .Marland Kitchen... Take 1 tablet by mouth two times a day for your blood pressure  BP today: 157/100 Prior BP:  132/91 (08/28/2007)  Labs Reviewed: Creat: 0.84 (03/15/2007) Chol: 162 (03/31/2006)   HDL: 40 (03/31/2006)   LDL:  80 (03/31/2006)   TG: 209 (03/31/2006)   Problem # 3:  HYPERLIPIDEMIA (ICD-272.4) The pt was started on 40mg  of Pravastatin upon D/C 3/09.  Will obtain a FLP today to see if her HL is improved on the higher dose of Pravastain.  Her updated medication list for this problem includes:    Pravastatin Sodium 40 Mg Tabs (Pravastatin sodium) .Marland Kitchen... Take 1 tablet by mouth at bedtime  Orders: T-Lipid Profile [id number])  Labs Reviewed: Chol: 162 (03/31/2006)   HDL: 40 (03/31/2006)   LDL: 80 (03/31/2006)   TG: 209 (03/31/2006) SGOT: 19 (03/15/2007)   SGPT: 10 (03/15/2007)   Problem # 4:  TOBACCO ABUSE (ICD-305.1) The pt was counseled for about 5 minutes on ways to stop smoking and was given a RX for nicotine patches.  Her updated medication list for this problem includes:    Nicotine 7 Mg/24hr Pt24 (Nicotine) ..... Use one patch per day to help you quit smoking.  remove the old patch before placing a new one.  Orders: Tobacco use cessation intermediate 3-10 minutes (86578)   Complete Medication List: 1)  Hydrochlorothiazide 25 Mg Tabs (Hydrochlorothiazide) .... Take 1 tablet by mouth once a day 2)  Protonix 40 Mg Tbec (Pantoprazole sodium) .... .take 1 tablet by mouth once a day 3)  Pravastatin Sodium 40 Mg Tabs (Pravastatin sodium) .... Take 1 tablet by mouth at bedtime 4)  Nitroglycerin 0.4 Mg Subl (Nitroglycerin) .... Place 1 tablet under the tongue every 5 minutes up to 3 times as needed for chest pain 5)  Aspirin 81 Mg Tbec (Aspirin) .... Take 1 tablet by mouth once a day 6)  Hyoscyamine Sulfate 0.125 Mg Subl (Hyoscyamine sulfate) .... Place 1 tablet under the tongue up to every 4 hours a day as needed for bloating 7)  Vicodin 5-500 Mg Tabs (Hydrocodone-acetaminophen) .... Take 1-2 tablet by mouth up to every 6 hours as needed 8)  Cafergot 1-100 Mg Tabs  (Ergotamine-caffeine) .... Take 2 tablets at onset of headache and then 1 tablet up to every 30 minutes as needed.  max of 6 tablets per day or 10 tablets per week. 9)  Nicotine 7 Mg/24hr Pt24 (Nicotine) .... Use one patch per day to help you quit smoking.  remove the old patch before placing a new one. 10)  Carvedilol 6.25 Mg Tabs (Carvedilol) .... Take 1 tablet by mouth two times a day for your blood pressure   Patient Instructions: 1)  Please schedule a follow-up appointment in 1 month for a blood pressure check and a PAP smear. 2)  You will be called with any abnormalities in your bloodwork from today. 3)  Continue your Metoprolol until it is gone, then stop taking it and start taking the Carvedilol (=Coreg) 6.25mg  two times a day.  Once you start the new medication, make sure you are seen within 2 weeks for a blood pressure check.   Prescriptions: CARVEDILOL 6.25 MG  TABS (CARVEDILOL) Take 1 tablet by mouth two times a day for your blood pressure  #60 x 5   Entered and Authorized by:   Chauncey Reading DO   Signed by:   Chauncey Reading DO on 12/07/2007   Method used:   Print then Give to Patient   RxID:   [id number] NICOTINE 7 MG/24HR  PT24 (NICOTINE) Use one patch per day to help you quit smoking.  Remove the old patch before placing a new one.  #30 x 2   Entered and Authorized by:   Lanora Manis  Landis Martins DO   Signed by:   Chauncey Reading DO on 12/07/2007   Method used:   Print then Give to Patient   RxID:   [id number] CAFERGOT 1-100 MG TABS (ERGOTAMINE-CAFFEINE) Take 2 tablets at onset of headache and then 1 tablet up to every 30 minutes as needed.  Max of 6 tablets per day or 10 tablets per week.  #30 x 1   Entered and Authorized by:   Chauncey Reading DO   Signed by:   Chauncey Reading DO on 12/07/2007   Method used:   Print then Give to Patient   RxID:   [id number] VICODIN 5-500 MG TABS (HYDROCODONE-ACETAMINOPHEN) Take 1-2 tablet by mouth up to every 6 hours as needed   #30 x 1   Entered and Authorized by:   Chauncey Reading DO   Signed by:   Chauncey Reading DO on 12/07/2007   Method used:   Print then Give to Patient   RxID:   [id number] HYOSCYAMINE SULFATE 0.125 MG SUBL (HYOSCYAMINE SULFATE) Place 1 tablet under the tongue up to every 4 hours a day as needed for bloating  #180 x 11   Entered and Authorized by:   Chauncey Reading DO   Signed by:   Chauncey Reading DO on 12/07/2007   Method used:   Print then Give to Patient   RxID:   [id number] ASPIRIN 81 MG TBEC (ASPIRIN) Take 1 tablet by mouth once a day  #30 x 11   Entered and Authorized by:   Chauncey Reading DO   Signed by:   Chauncey Reading DO on 12/07/2007   Method used:   Print then Give to Patient   RxID:   [id number] NITROGLYCERIN 0.4 MG SUBL (NITROGLYCERIN) Place 1 tablet under the tongue every 5 minutes up to 3 times as needed for chest pain  #25 x 3   Entered and Authorized by:   Chauncey Reading DO   Signed by:   Chauncey Reading DO on 12/07/2007   Method used:   Print then Give to Patient   RxID:   [id number] PRAVASTATIN SODIUM 40 MG  TABS (PRAVASTATIN SODIUM) Take 1 tablet by mouth at bedtime  #30 x 5   Entered and Authorized by:   Chauncey Reading DO   Signed by:   Chauncey Reading DO on 12/07/2007   Method used:   Print then Give to Patient   RxID:   [id number] PROTONIX 40 MG TBEC (PANTOPRAZOLE SODIUM) .Take 1 tablet by mouth once a day  #30 x 11   Entered and Authorized by:   Chauncey Reading DO   Signed by:   Chauncey Reading DO on 12/07/2007   Method used:   Print then Give to Patient   RxID:   [id number] HYDROCHLOROTHIAZIDE 25 MG TABS (HYDROCHLOROTHIAZIDE) Take 1 tablet by mouth once a day  #30 x 5   Entered and Authorized by:   Chauncey Reading DO   Signed by:   Chauncey Reading DO on 12/07/2007   Method used:   Print then Give to Patient   RxID:   [id number]  ]

## 2010-07-20 NOTE — Miscellaneous (Signed)
Summary: HIPAA Restrictions  HIPAA Restrictions   Imported By: Florinda Marker 04/14/2008 15:43:58  _____________________________________________________________________  External Attachment:    Type:   Image     Comment:   External Document

## 2010-07-28 ENCOUNTER — Ambulatory Visit (INDEPENDENT_AMBULATORY_CARE_PROVIDER_SITE_OTHER): Payer: Medicare Other | Admitting: Internal Medicine

## 2010-07-28 ENCOUNTER — Encounter: Payer: Self-pay | Admitting: Internal Medicine

## 2010-07-28 ENCOUNTER — Other Ambulatory Visit: Payer: Self-pay | Admitting: *Deleted

## 2010-07-28 VITALS — BP 147/95 | HR 79 | Temp 97.0°F | Ht 62.0 in | Wt 152.7 lb

## 2010-07-28 DIAGNOSIS — I10 Essential (primary) hypertension: Secondary | ICD-10-CM

## 2010-07-28 DIAGNOSIS — E785 Hyperlipidemia, unspecified: Secondary | ICD-10-CM

## 2010-07-28 DIAGNOSIS — K219 Gastro-esophageal reflux disease without esophagitis: Secondary | ICD-10-CM

## 2010-07-28 DIAGNOSIS — N390 Urinary tract infection, site not specified: Secondary | ICD-10-CM

## 2010-07-28 LAB — COMPREHENSIVE METABOLIC PANEL
ALT: 9 U/L (ref 0–35)
AST: 18 U/L (ref 0–37)
Albumin: 4 g/dL (ref 3.5–5.2)
Alkaline Phosphatase: 55 U/L (ref 39–117)
BUN: 13 mg/dL (ref 6–23)
CO2: 23 mEq/L (ref 19–32)
Calcium: 9.4 mg/dL (ref 8.4–10.5)
Chloride: 107 mEq/L (ref 96–112)
Creat: 0.65 mg/dL (ref 0.40–1.20)
Glucose, Bld: 94 mg/dL (ref 70–99)
Potassium: 4.1 mEq/L (ref 3.5–5.3)
Sodium: 141 mEq/L (ref 135–145)
Total Bilirubin: 0.4 mg/dL (ref 0.3–1.2)
Total Protein: 6.8 g/dL (ref 6.0–8.3)

## 2010-07-28 LAB — LIPID PANEL
Cholesterol: 209 mg/dL — ABNORMAL HIGH (ref 0–200)
HDL: 42 mg/dL (ref >39)
LDL Cholesterol: 121 mg/dL — ABNORMAL HIGH (ref 0–99)
Total CHOL/HDL Ratio: 5 Ratio
Triglycerides: 231 mg/dL — ABNORMAL HIGH (ref <150)
VLDL: 46 mg/dL — ABNORMAL HIGH (ref 0–40)

## 2010-07-28 MED ORDER — OMEPRAZOLE 40 MG PO CPDR: 40.0000 mg | DELAYED_RELEASE_CAPSULE | Freq: Every day | ORAL | Status: DC

## 2010-07-28 MED ORDER — SULFAMETHOXAZOLE-TRIMETHOPRIM 800-160 MG PO TABS: 1.0000 | ORAL_TABLET | Freq: Two times a day (BID) | ORAL | Status: AC

## 2010-07-28 NOTE — Progress Notes (Signed)
  Subjective:    Patient ID: Alexis Kline, female    DOB: 11/25/1945, 65 y.o.   MRN: [id number]  HPI Pt is 65 yo female with PMH outlined below who presents to [facility] with main concern of urinary frequency, urgency, dysuria that started several days ago and has been getting slightly better. She denies having fevers, chills, abdominal pain, no hematuria. She has had similar episode in the past and is rather sure that this is UTI. Her last UTI was 5 years ago. She denies any other concerns at the time. She denies any systemic symptoms, no abdominal concerns, no recent changes in weight or appetite. She reports medical compliance with BP medications but does mention she occasionally skips cholesterol medicine.   Review of Systems  Constitutional: Negative.   HENT: Negative.   Respiratory: Negative.   Cardiovascular: Negative.   Gastrointestinal: Negative.        Objective:   Physical Exam  Constitutional: She appears well-developed and well-nourished. No distress.  HENT:  Head: Normocephalic.  Right Ear: External ear normal.  Left Ear: External ear normal.  Nose: Nose normal.  Mouth/Throat: Oropharynx is clear and moist.  Eyes: Conjunctivae and EOM are normal. Pupils are equal, round, and reactive to light. Right eye exhibits no discharge. Left eye exhibits no discharge.  Neck: Normal range of motion. Neck supple. No JVD present. No tracheal deviation present. No thyromegaly present.  Cardiovascular: Normal rate, regular rhythm and intact distal pulses.  Exam reveals no gallop and no friction rub.   No murmur heard. Pulmonary/Chest: Effort normal and breath sounds normal. No stridor. No respiratory distress. She has no wheezes. She has no rales. She exhibits no tenderness.  Abdominal: Soft. Bowel sounds are normal. She exhibits no distension and no mass. There is no tenderness. There is no rebound and no guarding.  Musculoskeletal: Normal range of motion. She exhibits no edema and  no tenderness.  Lymphadenopathy:       Head (right side): Submandibular adenopathy present.       Head (left side): Submandibular adenopathy present.    She has no cervical adenopathy.       Bilateral submandibular lymph nodes slightly enlarged --> incidental finding, nontender  Skin: She is not diaphoretic.          Assessment & Plan:

## 2010-07-28 NOTE — Patient Instructions (Addendum)
Please schedule follow up appointment in 6 months.  

## 2010-07-28 NOTE — Assessment & Plan Note (Signed)
Slightly above the goal, she tells me that she is checking her BP regularly and it is usually within normal limits. Will check electrolyte panel today with LFT since she is on the statin as well. No changes to her medication regimen will be done today. Will continue to monitor.

## 2010-07-28 NOTE — Assessment & Plan Note (Signed)
Good control, will check FLP today since the last one was done 1 year ago and will readjust the medication regimen if indicated.

## 2010-07-28 NOTE — Assessment & Plan Note (Signed)
Well controlled, continue the same medications.

## 2010-07-29 ENCOUNTER — Other Ambulatory Visit: Payer: Self-pay | Admitting: *Deleted

## 2010-07-29 LAB — URINALYSIS
Bilirubin Urine: NEGATIVE
Ketones, ur: NEGATIVE mg/dL
Nitrite: POSITIVE — AB
Protein, ur: NEGATIVE mg/dL
Specific Gravity, Urine: 1.013 (ref 1.005–1.030)
Urine Glucose, Fasting: NEGATIVE mg/dL
Urobilinogen, UA: 0.2 mg/dL (ref 0.0–1.0)
pH: 6 (ref 5.0–8.0)

## 2010-07-29 MED ORDER — ASPIRIN 81 MG PO TBEC: 81.0000 mg | DELAYED_RELEASE_TABLET | Freq: Every day | ORAL | Status: DC

## 2010-07-29 MED ORDER — OMEPRAZOLE 40 MG PO CPDR: 40.0000 mg | DELAYED_RELEASE_CAPSULE | Freq: Every day | ORAL | Status: DC

## 2010-07-31 LAB — URINE CULTURE: Colony Count: >=100000

## 2010-08-02 ENCOUNTER — Ambulatory Visit (HOSPITAL_COMMUNITY): Payer: Medicare Other

## 2010-08-02 ENCOUNTER — Ambulatory Visit (HOSPITAL_COMMUNITY): Admission: RE | Admit: 2010-08-02 | Payer: Self-pay | Source: Home / Self Care | Admitting: Internal Medicine

## 2010-08-30 ENCOUNTER — Ambulatory Visit (HOSPITAL_COMMUNITY)
Admission: RE | Admit: 2010-08-30 | Payer: Medicare Other | Source: Ambulatory Visit | Attending: *Deleted | Admitting: *Deleted

## 2010-10-19 ENCOUNTER — Ambulatory Visit (HOSPITAL_COMMUNITY)
Admission: RE | Admit: 2010-10-19 | Discharge: 2010-10-19 | Disposition: A | Discharge status: Home / Self Care | Payer: Medicare Other | Source: Ambulatory Visit | Attending: Internal Medicine | Admitting: Internal Medicine

## 2010-10-19 DIAGNOSIS — Z1231 Encounter for screening mammogram for malignant neoplasm of breast: Secondary | ICD-10-CM | POA: Insufficient documentation

## 2010-10-20 ENCOUNTER — Other Ambulatory Visit: Payer: Self-pay | Admitting: *Deleted

## 2010-10-20 NOTE — Telephone Encounter (Signed)
Alexis Kline This pt will need an appointment so that we can get UA and urine cultures on her. Thank you Sherlon Handing

## 2010-10-20 NOTE — Telephone Encounter (Signed)
Call from pt said that she has a UTI.  When asked about symptoms she said that she is having frequency and burning on urination.  No fevers or other pain.  Saud that she had been treated recently and said that it is now back.  Pt was informed that  she may need an appointment.  York Spaniel that she uses [facility].  Message to Dr. Aldine Contes.

## 2010-10-21 ENCOUNTER — Ambulatory Visit: Payer: Medicare Other | Admitting: Internal Medicine

## 2010-10-21 ENCOUNTER — Telehealth (INDEPENDENT_AMBULATORY_CARE_PROVIDER_SITE_OTHER): Payer: Medicare Other | Admitting: *Deleted

## 2010-10-21 DIAGNOSIS — N390 Urinary tract infection, site not specified: Secondary | ICD-10-CM

## 2010-10-21 MED ORDER — NITROFURANTOIN MONOHYD MACRO 100 MG PO CAPS: 100.0000 mg | ORAL_CAPSULE | Freq: Two times a day (BID) | ORAL | Status: AC

## 2010-10-21 NOTE — Telephone Encounter (Signed)
Pt calls and states she did not receive a return call yesterday, she thinks she has a recurrent uti, feels very bad, burning w/ voiding, low abd pain, low back pain. appt is made for 1415 today.

## 2010-10-21 NOTE — Telephone Encounter (Signed)
I called Alexis Kline and confirmed that she is having urinary frequency with dysuria.  This is typical for her UTI symptoms.  She has very infrequent UTIs and thus has an uncomplicated urinary tract infection.  Her last UTI was 3 months ago.  Prior to that it was 10 years ago.  When she was treated 3 months ago she was placed on Bactrim with resolution of symptoms.  Since this is a recurrence within 6 months it is appropriate to give another empiric trial of a first line drug that is different from Bactrim (? resistance).  We will therefore give a 5 days course of nitrofurantoin 100 mg PO BID with meals.  She does not need to have a culture taken at this point, nor does she require a clinic visit.  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  She was fine with the plan.

## 2010-10-26 ENCOUNTER — Other Ambulatory Visit (INDEPENDENT_AMBULATORY_CARE_PROVIDER_SITE_OTHER): Payer: Medicare Other | Admitting: *Deleted

## 2010-10-26 DIAGNOSIS — I2510 Atherosclerotic heart disease of native coronary artery without angina pectoris: Secondary | ICD-10-CM

## 2010-10-26 DIAGNOSIS — I10 Essential (primary) hypertension: Secondary | ICD-10-CM

## 2010-10-26 MED ORDER — HYDROCHLOROTHIAZIDE 25 MG PO TABS: 25.0000 mg | ORAL_TABLET | Freq: Every day | ORAL | Status: DC
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
## 2010-11-02 NOTE — Discharge Summary (Signed)
Alexis Kline, ESQUER NO.:  [id number]   MEDICAL RECORD NO.:  [id number]          PATIENT TYPE:  OUT   LOCATION:  EKG                          FACILITY:  [facility]   PHYSICIAN:  Madaline Guthrie, M.D.    DATE OF BIRTH:  June 12, 1946   DATE OF ADMISSION:  08/28/2007  DATE OF DISCHARGE:  08/29/2007                               DISCHARGE SUMMARY   ATTENDING PHYSICIAN:  Dr. Madaline Guthrie.   DISCHARGE DIAGNOSES:  1. Atypical chest pain.  2. Nonobstructive coronary artery disease with last catheterization in      September 2007 showing a 30% left anterior descending obstruction      with ostial spasm of the right coronary artery.  3. Reported history of two myocardial infarctions, one in [place] in      1997 and one in early 2000 at [facility].  4. Hypertension.  5. Hyperlipidemia, total cholesterol 216, triglycerides 214, HDL 41,      LDL 132.  6. Ongoing tobacco abuse.  7. Hemorrhoids and rectal prolapse, status post hemorrhoidectomy in      February 2008 and July 2008.  8. Gastroesophageal reflux disease, normal esophagogastroduodenoscopy      in May 2007.  9. Status post hernia repair in 2004.  10.Status post appendectomy in 1964.  11.Status post total abdominal hysterectomy secondary to fibroids.   DISCHARGE MEDICATIONS:  1. HCTZ 25 mg p.o. daily.  2. Protonix 40 mg p.o. daily.  3. Pravachol 40 mg p.o. q.h.s.  4. Metoprolol 25-mg tabs, 1/2 tab p.o. b.i.d.  5. Nicotine patch 7 mg transdermally daily.   DISPOSITION/FOLLOWUP:  The patient was discharged in hemodynamically  stable condition after an acute coronary syndrome had been ruled out.  She will follow up with a [facility] cardiologist within the next few  weeks.  The Dietitian will call Mrs. Soliman to set up an  appointment since it was unclear which cardiologist would be able to see  her for followup.  On her cardiology followup, please evaluate the  necessity for stress  testing.  I will try to obtain past medical records  of her prior MIs in order to clarify the situation.  Mrs. Nies  will also follow up with her primary care physician, Dr. Gabriel Earing, at  the [facility].  She is to schedule a followup  appointment in 6 to 8 weeks, at which time her LFTs and fasting lipid  profile should be monitored.   CONSULTATIONS:  Dr. Charlton Haws from [facility] was consulted.   PROCEDURES:  None.   ADMISSION HISTORY:  Mrs. Beretta is a 65 year old woman who presented  to the [facility] outpatient clinic for a regular checkup and was found  to have intermittent chest tightness.  For as along as she can remember,  she has had chest tightness which she describes as her bra feeling too  tight across her chest.  The episodes happen frequently and have been  doing so even more lately; now up to two to three times a day.  They  never last for the same length of  time (can go from a few minutes to all  day long), and whether or not she is resting or exerting makes no  difference in the pattern.  She reportedly has had 2 MIs in the past,  which did not feel like this; however, she has been having reproducible  exertional dyspnea worsened over the past 3 months.  She now gets  dyspneic with walking half a block.  When this happens she also gets  heart palpitations, lightheadedness and nausea but no chest pain,  tightness, diaphoresis or syncope.  She does have nitroglycerin at home  but rarely uses it because it causes a bad headache.  She last used it  in November 2008 after a five-mile trek  and believes it did help some  with her chest discomfort but does not recall much because of the  headache it gave her.   ALLERGIES:  TAPE, IODINE.   SOCIAL HISTORY:  Mrs. Swiger is divorced.  She lives by herself and  has two healthy daughters.  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  She has Medicaid and Medicare.   PHYSICAL  EXAMINATION:  VITAL SIGNS:  On admission exam, temperature  97.6, blood pressure 132/91, heart rate 78, weight 160 pounds.  GENERAL:  No acute distress, a middle-aged woman.  HEENT:  Eyes anicteric.  EOMI.  PERRLA.  ENT:  Oropharynx clear.  NECK:  No JVD, carotid bruit or lymphadenopathy.  RESPIRATORY:  Lungs clear to auscultation bilaterally with good air  movement.  CARDIOVASCULAR:  Regular rate and rhythm.  No murmurs, rubs or gallops.  ABDOMEN:  Bowel sounds positive.  Abdomen soft, nontender, nondistended.  No palpable masses or organomegaly.  EXTREMITIES:  Right lower extremity trace ankle edema.  Bilateral  varicosities right lower extremity more than left lower extremity.  NEURO:  Grossly nonfocal.   LABORATORIES:  Sodium 142, potassium 4.1, chloride 105, bicarb 29, BUN  11, creatinine 0.79, glucose 90, white blood count 7.9, ANC 5.0,  hemoglobin 13.5, MCV 80, platelets 252, RDW 15.6.  LFTs normal.   HOSPITAL COURSE:  1. Atypical chest tightness.  From her history, it seems very unlikely      that Mrs. Gillentine' chest tightness had anything to do with      whatever coronary artery disease she may or may not have.  It was      not worrisome for unstable angina.  Since there was vasospasm      documented in her last cath report, I wondered whether this could      have anything to do with the chest tightness; however, after      speaking with Dr. Eden Emms, our cardiology consultant, he felt that      the vasospasm was likely secondary to the procedure.  As previously      mentioned, the patient was ruled out for an acute coronary syndrome      with serial cardiac enzymes and serial EKGs.  She will follow up      with cardiology on an outpatient basis for a possible stress      Myoview.  2. Exertional dyspnea.  I had a concern that her exertional dyspnea      could be an angina equivalent, but, as previously mentioned, her      coronary artery disease was minimal on her prior cath  and she was      certainly not having an acute coronary syndrome requiring further      workup  at this time.  3. Coronary artery disease.  Now what is unclear is how the patient      could have had two prior MIs in 1997 and in 2003 without having      more than a 30% lesion on her last cath.  This raises the      possibility that she may in fact have vasospasm.  I will try to      obtain past medical records so that the cardiologist who sees her      on followup has access to prior documentation.  The patient was      kept on aspirin, her beta blocker and statin while hospitalized and      they will be continued on an outpatient basis.  4. Hyperlipidemia.  The patient had a fasting lipid profile done,      which revealed a total cholesterol of 216, triglycerides of 214,      HDL 41 and LDL 132.  She was only on Pravachol 10 mg daily.  I      increased her dose up to 40 mg p.o. q.h.s. to improve her control.      The LFTs and fasting lipid profile can be followed up on an      outpatient basis.  5. Ongoing tobacco abuse.  Mrs. Barnfield started smoking when she was      about 65 years of age and smoked a maximum of 4-1/2 packs per day      back when she was still working as a Naval architect.  She is now down      to about a quarter pack per day and is trying to quit.  She did not      tolerate Chantix in the past, so she is now using nicotine      replacement patches.  She was continued on the 7-mg transdermal      nicotine patches while hospitalized.   Discharge vitals:  Temperature 97, heart rate 68, blood pressure 109/70,  O2 sat 96% on room air.   DISCHARGE LABORATORIES:  Cardiac enzymes negative x3.  TSH 1.34.      Olene Craven, M.D.  Electronically Signed      Madaline Guthrie, M.D.  Electronically Signed    MC/MEDQ  D:  08/29/2007  T:  08/30/2007  Job:  [id number]
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
## 2010-11-02 NOTE — Assessment & Plan Note (Signed)
[facility]                            CARDIOLOGY OFFICE NOTE   EDWARD, TREVINO                  MRN:          [id number]  DATE:08/31/2007                            DOB:          Apr 08, 1946    IDENTIFICATION:  Ms. Brill is a 65 year old woman whom actually was  just seen a few days ago by Theron Arista C. Eden Emms, MD, while she was an  inpatient.  We were consulted as cardiologists for chest pain.  It is  felt that her symptoms were atypical and she had ruled out for  myocardial infarction.  She was set up for an outpatient Myoview, which  she has not been scheduled.   Since discharge she notes occasional squeezing.  She says it is like a  balloon with a slow leak.  It has occurred over the past 6 months with  and without activity.  She has noted her activity level has gone down  some.   CURRENT MEDICINES:  1. Aspirin 81 mg daily.  2. Metoprolol 12.5 b.i.d.  3. Pravachol 10 mg.  4. Hyoscyamine 0.125 mg.  5. Protonix 40 mg.  6. Hydrochlorothiazide 25 mg.  7. Cafergot daily.   PHYSICAL EXAM:  The patient is in no distress.  Blood pressure is 135/85, pulse is 77, weight is 158.  LUNGS:  Clear.  CARDIAC:  Regular rate and rhythm, S1 and S2, no S3, no murmurs.  ABDOMEN:  Benign.  EXTREMITIES:  No edema.   IMPRESSION:  1. Chest pain.  Again, recent admission as noted above.  Does get some      exertional dyspnea and has had fatigability.  Note she has a long-      time smoker.  I would recommend again as noted previously that she      proceed with a Myoview scan, and this will be set up.  Continue on      current medicines for now with activities as tolerated.  No      definite follow-up was planned.  Follow-up will be made based on      the test results.     Pricilla Riffle, MD, [facility]  Electronically Signed    PVR/MedQ  DD: 09/03/2007  DT: 09/03/2007  Job #: [id number]

## 2010-11-05 ENCOUNTER — Encounter: Payer: Medicare Other | Admitting: Internal Medicine

## 2010-11-05 NOTE — Discharge Summary (Signed)
Alexis Kline, Alexis Kline           ACCOUNT NO.:  [id number]   MEDICAL RECORD NO.:  [id number]          PATIENT TYPE:  INP   LOCATION:  2019                         FACILITY:  [facility]   PHYSICIAN:  Peggye Pitt, M.D. DATE OF BIRTH:  08-Aug-1945   DATE OF ADMISSION:  02/23/2006  DATE OF DISCHARGE:  02/25/2006                                 DISCHARGE SUMMARY   DISCHARGE DIAGNOSES:  1. Chest pain, ruled out for myocardial infarction with nonobstructive      coronary artery disease and cardiac catheterization.  2. Hypertension.  3. Gastroesophageal reflux disease.  4. Hyperlipidemia.  5. Ongoing tobacco abuse.   DISCHARGE MEDICATIONS:  1. Aspirin 81 mg p.o. daily.  2. Lipitor 80 mg p.o. q.h.s.  3. Hydrochlorothiazide 25 mg p.o. daily.  4. Lopressor 12.5 mg 1 tablet b.i.d.  5. Protonix 40 mg p.o. daily.  6. Nicotine patch 21 mg daily for 7 days, then 14 mg daily for 7 days, and      then 7 mg daily for 7 days.   FOLLOWUP:  Patient will follow up with Dr. Antony Contras at the [facility] on March 17, 2006 at 1:30 p.m., at which time she  should be evaluated for the complete resolution of her chest pain.   PROCEDURES:  Patient had a 2-D echo performed on February 24, 2006  consistent with normal left ventricular systolic function with no left  ventricular regional wall motion abnormalities, and only mild mitral annular  calcification.  The patient also underwent a cardiac catheterization by  [facility], that showed a 30% obstruction of the LAD and ostial  spasm of the RCA.   HISTORY AND PHYSICAL:  For full details please refer to the patient's chart.  In brief, Alexis Kline is a 65 year old lady who presented to the  outpatient clinic with a 4-day history of chest pain, substernal, and under  her left chest.  Described as dull and needle like and compared to wearing  a bra that is several sizes too small.  The pain was episodic, lasted about  15  minutes to an hour, occurs at rest as well as with exertion.  It was  relieved by sublingual nitroglycerin and occasionally by ambulating.  She  does note that this pain is associated with shortness of breath,  diaphoresis, and she also has nausea, generalized weakness, and fatigue.   PHYSICAL EXAMINATION:  VITAL SIGNS:  Her temperature was 98.4, blood  pressure 159/99, with a pulse of 94, and she was 98% on room air for her O2  saturation.  GENERAL:  Alert, awake, oriented x3, in mild distress secondary to the chest  pain.  CARDIOVASCULAR:  Regular rate and rhythm with no murmurs, rubs, or gallops  appreciated.  LUNGS:  Clear to auscultation bilaterally.  ABDOMEN:  Soft, nontender, nondistended.  Positive bowel sounds.  EXTREMITIES:  No edema with 2+ pedal pulses.  NEUROLOGIC:  Nonfocal.   ADMISSION LABORATORY DATA:  CBC:  WBC of 6.6, hemoglobin of 13.7, and  platelets of 277,000.  She also had a CMET with a sodium 140, potassium 3.7,  chloride 104,  bicarb 28, glucose 91, BUN 8, creatinine 0.8.  Bilirubin 0.6,  alkaline phosphatase 66, AST 24, ALT 14.  Total protein 7.3, albumin 4, and  a calcium of 9.6.  She had 3 sets of cardiac enzymes.  First set:  CK of  113, MB of 3.5, and a troponin of 0.06.  Second set showed a CK of 94, MB of  2.9, troponin of 0.04.  third set showed a CK of 92, MB of 2.6, and a  troponin of 0.04.   HOSPITAL COURSE:  1. Chest pain.  Given the fact that this chest pain in this woman's chest      pain was very typical and she had given [place] a history of having previous      MIs in the past, we decided to consult cardiology for a possible      catheterization.  In the mean time, we sent for 3 sets of cardiac      enzymes, for which she was ruled out for myocardial infarction.  She      was sent for an EKG, but it was essentially normal.  She, as well, had      a 2-D echo with findings as described as above and then, because of her      typical chest pain and  her questionable history of prior MIs in the      past for which we did not have any records, [facility] was      gracious enough to perform a cardiac catheterization, which showed      nonobstructive coronary artery disease with a 30% obstruction of the      LAD and showed some RCA ostial spasm.  So, at this point, the main      concern with Alexis Kline is risk factor control, which will include      controlling her high blood pressure, lowering her cholesterol with a      statin, and a low-fat diet as well as smoking cessation.  2. Hypertension.  She was kept on her daily dose of hydrochlorothiazide 25      as well as Lopressor 12.5 b.i.d.  3. GERD.  The patient was asymptomatic from her reflux disease.  We kept      her on her Protonix and should be evaluated for further continued use      as an outpatient.  4. Tobacco abuse.  She understands the importance for decreasing her      nicotine intake, as this will improve her cardiac risk factors.  We      have sent her out on a nicotine patch to be tapered down to a lower      dose over the course of 3 weeks and her primary care physician should      keep in touch with her regarding smoking cessation.   DISCHARGE VITALS:  Temperature 98.2, blood pressure 105/69 with a heart rate  of 88, respirations 20, 95% on room air.   LABORATORY DATA ON DISCHARGE:  WBC 10.9, hemoglobin 13.1, hematocrit 33.7,  platelets 280,000.  Sodium 137, potassium 3, chloride 105, bicarb 26, BUN  19, creatinine 0.9, and a glucose of 104.      Peggye Pitt, M.D.  Electronically Signed     EH/MEDQ  D:  02/25/2006  T:  02/26/2006  Job:  [id number]   cc:   Chauncey Reading, D.O.

## 2010-11-05 NOTE — Op Note (Signed)
Alexis Kline, Alexis Kline           ACCOUNT NO.:  [id number]   MEDICAL RECORD NO.:  [id number]          PATIENT TYPE:  AMB   LOCATION:  DAY                          FACILITY:  [facility]   PHYSICIAN:  Ollen Gross. Vernell Morgans, M.D. DATE OF BIRTH:  08-Jul-1945   DATE OF PROCEDURE:  08/02/2006  DATE OF DISCHARGE:                               OPERATIVE REPORT   PREOPERATIVE DIAGNOSIS:  Internal hemorrhoids.   POSTOPERATIVE DIAGNOSIS:  Internal hemorrhoids.   PROCEDURES:  Procedure for prolapse and hemorrhoids, hemorrhoidectomy.   SURGEON:  Dr. Carolynne Edouard   ANESTHESIA:  General endotracheal.   PROCEDURE:  After informed consent was obtained, the patient was brought  to the operating room, left in the supine position on the stretcher.  After adequate of induction of general anesthesia, the patient was moved  into a prone position on the operating room table and all pressure  points were padded.  The patient's buttocks were retracted laterally  with tape.  The patient was then placed in a flexed Trendelenburg  position.  The perirectal area was then prepped with Hibiclens and  draped in the usual sterile manner.  A small bullet retractor was  initially used to inspect the inside of the rectum.  The patient had  some internal hemorrhoids, but no other abnormalities were noted.  The  perirectal area was then infiltrated with 0.25% Marcaine with  epinephrine and 1 cc of Wydase and tissue was massaged gently for  several minutes.  A deep Fansler retractor was then placed in the  rectum.  A 2-0 Prolene pursestring stitch was placed in the mucosa and  submucosa of the rectum, about 4-5 cm deep to the dentate line.  Care  was taken to make sure each stitch began where the previous stitch ended  and that no muscle was gathered up in the stitch.  Once this stitch was  in good position, the Fansler retractor was removed.  A clear plastic  retractor and white dilator were then used.  The tails of the Prolene  stitch were brought through the middle of the of the retractor and  dilator.  Retractor and dilator were then placed within the rectum as  far as they could be placed.  The white dilator was removed, holding the  clear plastic retractor in place.  The stapling device was then placed  in the rectum and the mushroom tip of the stapling device was felt to  fall beneath the pursestring stitch.  The pursestring stitch was then  cinched down and tied.  The tails of the stitch were then brought  through the lateral holes of the stapling device.  An air knot was then  tied and was clamped with a hemostat for retracting.  The stapling  device was then closed all the way while gently advancing it into the  rectum.  Once this was accomplished, the 4 cm mark was deep to the rim  of the clear retractor, which was therefore in appropriate position.  A  finger was placed in the vagina to make sure the stapling device did not  have any of the muscular wall  vagina and it did not.  The stapling  device was then fired.  A minute was allowed to pass.  The stapling  device was then opened a half turn and removed from the rectum.  The  ring of hemorrhoid tissue was examined and it was a complete ring, very  uniform.  There was no muscle identified and the specimen.  It was sent  to pathology for further evaluation.   The staple line was then examined and a couple of small bleeding points  were controlled with 4-0 Vicryl figure-of-eight stitches.  Once this was  accomplished, the staple line was very hemostatic and appeared to be in  good position  deep to the dentate line.  The rectum was then filled with lidocaine  jelly and a small piece of Gelfoam and sterile dressings were applied.  The patient tolerated the procedure well.  At the end of the case all  needle, sponge and instrument counts correct.  The patient was then  awakened and taken to the recovery in stable condition.      Ollen Gross. Vernell Morgans,  M.D.  Electronically Signed     PST/MEDQ  D:  08/02/2006  T:  08/02/2006  Job:  [id number]

## 2010-11-05 NOTE — Cardiovascular Report (Signed)
NAMENYAIRA, HODGENS NO.:  [id number]   MEDICAL RECORD NO.:  [id number]          PATIENT TYPE:  INP   LOCATION:  2019                         FACILITY:  [facility]   PHYSICIAN:  Arturo Morton. Riley Kill, MD, FACCDATE OF BIRTH:  04-27-1946   DATE OF PROCEDURE:  02/24/2006  DATE OF DISCHARGE:  02/25/2006                              CARDIAC CATHETERIZATION   INDICATIONS:  Ms. Allport is a 65 year old who presents with some recurrent  chest pain. Historically, she has a history of MI.  She has had hypertension  and hyperlipidemia.  She continues to smoke and, as recently as a few months  ago, was up to three packs per day.  Current study was done to assess  coronary anatomy.   PROCEDURES:  1. Left heart catheterization  2. Selective coronary arteriography.  3. Selective left ventriculography.  4. Intracoronary nitroglycerin administration.   DESCRIPTION OF PROCEDURE:  The patient was brought to the catheterization  laboratory, prepped and draped in usual fashion. Through an anterior  puncture, the right femoral artery was easily entered. A 6-French sheath was  placed. Views of the left and right coronary arteries were then obtained in  multiple angiographic projections.  There appeared to be a little bit of  ostial spasm in the right coronary artery, and intracoronary nitroglycerin  was administered.  Central aortic and left ventricular pressures were  measured with a pigtail catheter.  Ventriculography was performed in the RAO  projection using 22 mL contrast at a flow rate of 11 mL per second.  She  tolerated the procedure without complication and was taken to the holding  area in satisfactory clinical condition.   HEMODYNAMIC DATA.:  1. Central aortic pressure 125/73, mean 97.  2. Left ventricular pressure 138/2.  3. No gradient on pullback across the aortic valve.   ANGIOGRAPHIC DATA:  On plain fluoroscopy, there was suggestion of pulmonary  calcification.  Significant coronary calcification was not visualized  1. Ventriculography in the RAO projection reveals vigorous global systolic      function.  2. The right coronary is a large-caliber vessel.  There is minor luminal      irregularity in the proximal portion of the mid vessel, but no areas of      high-grade stenosis.  There was a little bit of ostial spasm noted, and      this was relieved by intracoronary nitroglycerin administration at the      ostium. The PDA and posterolateral were free of critical disease.  3. The left main coronary is short but free of critical disease.  4. The LAD courses to the apex.  There is a minor diagonal branch just      after this area.  There is some segmental plaquing.  This measures      about 30% luminal reduction.  The vessel then opens up, and there is      another area of about 30% luminal reduction with a fair amount luminal      irregularity.  The mid and distal LAD are without critical stenoses.      There are two small diagonal  branches that are not significantly      obstructed.  5. The circumflex provides predominantly one a very large marginal branch      with some proximal irregularity but no critical stenoses.   CONCLUSION:  1. Preserved left ventricular function  2. Segmental abnormalities of the left anterior descending artery as noted      in the above text.   RECOMMENDATIONS:  I do believe the patient absolutely must stop smoking.  She has segmental disease of the LAD. Significant risk factor reduction  would be warranted as well.  I have discussed this case with Dr. Tenny Craw.      Arturo Morton. Riley Kill, MD, [facility]  Electronically Signed     TDS/MEDQ  D:  02/24/2006  T:  02/25/2006  Job:  [id number]   cc:   Pricilla Riffle, MD, [facility]  Alvester Morin, M.D.  CV Laboratory
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
## 2010-11-05 NOTE — Assessment & Plan Note (Signed)
Alexis Kline                         GASTROENTEROLOGY OFFICE NOTE   Alexis Kline, Alexis Kline                    MRN:          [id number]  DATE:07/03/2006                            DOB:          [birth year]    PRIMARY CARE PHYSICIAN:  Dr.  Chauncey Reading.   GI PROBLEM LIST:  1. Recurrent rectal hemorrhoids.  She has been seen several time by      Dr. Carolynne Edouard but it seems like hemorrhoids have always resolved by the      time she sees him.  Colonoscopy by Dr. Vilinda Boehringer in 2006 was      fairly normal except for internal hemorrhoids.  He had arranged for      her to see Dr. Carolynne Edouard.  2. Dyspepsia and reflux symptoms.  EDG May 2007 by me was normal.      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   INTERVAL HISTORY:  I last saw Alexis Kline in May 2007.  She was to  return to see me 3 to 4 months after that, but has failed to follow up.  She returns now with painful swelling at her anus.  She has been  bleeding intermittently.   CURRENT MEDICINES:  Hydrochlorothiazide, Prevacid, glucosamine,  multivitamin daily.   PHYSICAL EXAMINATION:  Weight 153 pounds.  Blood pressure 132/74.  Pulse  75.  CONSTITUTION:  Generally well-appearing.  ABDOMEN:  Soft, nontender, nondistended.  RECTAL:  With female tech in the room:  Sore, tender-appearing  thrombosed, probably internal hemorrhoid, quite reddish, bluish in  appearance.  I did not do an internal examination due to her anal pain.  The size of the hemorrhoid was approximately 2 cm.   ASSESSMENT AND PLAN:  A 65 year old woman with recurrent rectal  hemorrhoids.   Alexis Kline is quite frustrated with her situation.  It sounds like  every time she is evaluated for this the hemorrhoids had resolved or at  least greatly reduced.  However, today on examination she has a very  obviously swollen and tender, likely internal hemorrhoid.  I do not  treat hemorrhoids and so I am arranging for her to be seen back by Dr.  Carolynne Edouard or one  of his partners in the very near future so that they can  arrange for local treatment.  In the meantime, I have given her samples  for Analpram 2.5% cream to apply locally twice daily.  Of note recent  blood work  including a CBC and complete metabolic profile were all normal, so even  though she has been bleeding periodically, she is not anemic.     Rachael Fee, MD  Electronically Signed    DPJ/MedQ  DD: 07/03/2006  DT: 07/04/2006  Job #: [id number]   cc:   Chauncey Reading, D.O.  Ollen Gross. Vernell Morgans, M.D.

## 2010-11-05 NOTE — Op Note (Signed)
Alexis Kline, Alexis Kline           ACCOUNT NO.:  [id number]   MEDICAL RECORD NO.:  [id number]          PATIENT TYPE:  AMB   LOCATION:  ENDO                         FACILITY:  [facility]   PHYSICIAN:  James L. Malon Kindle., M.D.DATE OF BIRTH:  [birth year]   DATE OF PROCEDURE:  08/09/2004  DATE OF DISCHARGE:                                 OPERATIVE REPORT   PROCEDURE:  Colonoscopy.   MEDICATIONS:  1.  Fentanyl 100 mcg  IV.  2.  Versed 10 mg IV.   SCOPE:  Olympus pediatric adjustable colonoscope.   INDICATIONS:  Rectal bleeding, possible rectal prolapse.   DESCRIPTION OF PROCEDURE:  The procedure had been explained to the patient  and consent obtained.  In the left lateral decubitus position, the pediatric  scope was inserted and advanced. There were no masses in the rectum; we were  able to advance easily to the cecum using abdominal pressure and position  changes.  The ileocecal valve was seen.  The scope was withdrawn from the  cecum; ascending colon, transverse colon, splenic flexure, descending and  sigmoid colon were seen well.  There were no polyps, masses, no significant  diverticular disease.   The scope was withdrawn to the rectum, with air insufflation the rectum was  normal with no masses; there were internal hemorrhoids.   The scope was withdrawn. The patient tolerated the procedure well.   ASSESSMENT:  Intermittent rectal bleeding with mass bulging at the rectum,  by her clinical description; with nothing on endoscopy to explain this,  other than some hemorrhoids.  I suspect she may have intermittent rectal  prolapse.   PLAN:  Will arrange a surgical consultation.     JLE/MEDQ  D:  08/09/2004  T:  08/09/2004  Job:  [id number]   cc:   Maurice March, M.D.  [address] [place]  [place] [address]  Fax: [phone]

## 2010-12-27 ENCOUNTER — Other Ambulatory Visit: Payer: Self-pay | Admitting: *Deleted

## 2010-12-27 MED ORDER — PRAVASTATIN SODIUM 40 MG PO TABS: 40.0000 mg | ORAL_TABLET | Freq: Every day | ORAL | Status: DC

## 2010-12-27 MED ORDER — CARVEDILOL 12.5 MG PO TABS: 12.5000 mg | ORAL_TABLET | Freq: Two times a day (BID) | ORAL | Status: DC

## 2010-12-27 NOTE — Telephone Encounter (Signed)
Last labs on 2/8 were abnormal

## 2011-02-13 ENCOUNTER — Emergency Department (HOSPITAL_COMMUNITY): Payer: Medicare Other

## 2011-02-13 ENCOUNTER — Emergency Department (HOSPITAL_COMMUNITY)
Admission: EM | Admit: 2011-02-13 | Discharge: 2011-02-13 | Disposition: A | Discharge status: Home / Self Care | Payer: Medicare Other | Attending: Emergency Medicine | Admitting: Emergency Medicine

## 2011-02-13 DIAGNOSIS — M79609 Pain in unspecified limb: Secondary | ICD-10-CM | POA: Insufficient documentation

## 2011-02-13 DIAGNOSIS — M545 Low back pain, unspecified: Secondary | ICD-10-CM | POA: Insufficient documentation

## 2011-02-13 DIAGNOSIS — R29898 Other symptoms and signs involving the musculoskeletal system: Secondary | ICD-10-CM | POA: Insufficient documentation

## 2011-02-13 DIAGNOSIS — Z79899 Other long term (current) drug therapy: Secondary | ICD-10-CM | POA: Insufficient documentation

## 2011-02-13 DIAGNOSIS — I2510 Atherosclerotic heart disease of native coronary artery without angina pectoris: Secondary | ICD-10-CM | POA: Insufficient documentation

## 2011-02-13 DIAGNOSIS — IMO0002 Reserved for concepts with insufficient information to code with codable children: Secondary | ICD-10-CM | POA: Insufficient documentation

## 2011-02-13 DIAGNOSIS — I10 Essential (primary) hypertension: Secondary | ICD-10-CM | POA: Insufficient documentation

## 2011-02-25 ENCOUNTER — Ambulatory Visit (INDEPENDENT_AMBULATORY_CARE_PROVIDER_SITE_OTHER): Payer: Medicare Other | Admitting: Internal Medicine

## 2011-02-25 VITALS — BP 125/83 | HR 79 | Temp 96.6°F | Wt 155.8 lb

## 2011-02-25 DIAGNOSIS — M5116 Intervertebral disc disorders with radiculopathy, lumbar region: Secondary | ICD-10-CM

## 2011-02-25 DIAGNOSIS — M5126 Other intervertebral disc displacement, lumbar region: Secondary | ICD-10-CM

## 2011-02-25 DIAGNOSIS — I10 Essential (primary) hypertension: Secondary | ICD-10-CM

## 2011-02-25 MED ORDER — IBUPROFEN 400 MG PO TABS: 400.0000 mg | ORAL_TABLET | Freq: Three times a day (TID) | ORAL | Status: AC

## 2011-02-25 MED ORDER — GABAPENTIN 300 MG PO CAPS: ORAL_CAPSULE | ORAL | Status: DC

## 2011-02-25 MED ORDER — HYDROCODONE-ACETAMINOPHEN 5-500 MG PO TABS: 1.0000 | ORAL_TABLET | Freq: Four times a day (QID) | ORAL | Status: DC | PRN

## 2011-02-25 NOTE — Assessment & Plan Note (Signed)
Blood pressure at goal, 125/83. Continue the current medications at same dose.

## 2011-02-25 NOTE — Assessment & Plan Note (Signed)
Alexis Kline was seen in the ED recently on 02/13/2011 for possible acute lumbar disc herniation versus sciatica on left. Her symptoms are worse after 10 days now. As per history of present illness and exam, she seems to have nerve compression symptoms but does not have any emergent surgical indications in terms of loss of bowel or bladder control or significant weakness of lower extremities. She was instructed to followup with neurosurgeon during the ED visit, but she is aware of that. She does not want to get any surgery as she had many surgeries in past-also I discussed with her that there is no emergent indication for surgery at this point of time, but it would be better if she has a neurosurgical evaluation sooner. After that she might likely need some physical therapy only along with pain management for supportive care. She agrees to the plan. I advised her to avoid any straining of her lower spine. Will prescribe Neurontin and Advil. Neurontin 300 mg once a day for 3 days then twice a day for 3 days and then 3 times a day. Advil 400 mg 3 times a day for 2 weeks. We will see her back in 3-4 weeks after neurosurgical evaluation for further followup and management.

## 2011-02-25 NOTE — Progress Notes (Signed)
  Subjective:    Patient ID: Alexis Kline, female    DOB: May 23, 1946, 65 y.o.   MRN: [id number]  HPI Alexis Kline is a pleasant 67-year-old woman with past with history of hypertension, CAD who comes to the clinic for followup visit after recent ED visit for possible lumbar disc herniation on 02/13/2011. She was diagnosed with left lumbar disc herniation/sciatica in ED at above date and was prescribed prednisone, Vicodin and Valium for pain. Her pain is not getting any better-in fact it's getting worse. The pain starts in left lower lumbar spine and radiates to the lateral side of thigh up to the leg.   the pain is 10 out of 10, continuous stabbing and shooting in nature, gets worse with any activity which strains lower back. Nothing makes it better. Minimally control with above medications. She was feeling woozy and had trouble walking after starting on Vicodin and Valium together. She said that she almost fell twice at home, since the ED visit. She is already on Vicodin at home and so Valium is a likely culprit for her unsteadiness.  She cannot bend forward more than 25-30 due to severe pain- starting in lower back and going to the left leg. The ED visit note says that she was instructed to follow with neurosurgeon-but she is unaware of that. She denies any loss of bladder or bowel control, significant weakness of left lower extremity. She denies any fever, chills, headache, abdominal pain, nausea vomiting, chest pain, short of breath.    Review of Systems     as per history of present illness, all other systems reviewed and negative.  Objective:   Physical Exam Constitutional: Vital signs reviewed.  Patient is a well-developed and well-nourished, in  acute distress due to pain but cooperative with exam. Alert and oriented x3.  Head: Normocephalic and atraumatic Mouth: no erythema or exudates, MMM Eyes: PERRL, EOMI, conjunctivae normal, No scleral icterus.  Neck: Supple, Trachea  midline normal ROM, No JVD Cardiovascular: RRR, S1 normal, S2 normal, no MRG, pulses symmetric and intact bilaterally Pulmonary/Chest: CTAB, no wheezes, rales, or rhonchi Abdominal: Soft. Non-tender, non-distended, bowel sounds are normal, no masses, organomegaly, or guarding present.  Musculoskeletal: Lower lumbar spinal tenderness, no paraspinal tenderness. Decreased range of motion of lower lumbar spine.  Neurological: A&O x3, Strenght is normal and symmetric bilaterally, cranial nerve II-XII are grossly intact, no focal motor deficit, tingling and numbness left leg with minimally decreased sensations.  Skin: Warm, dry and intact. No rash, cyanosis, or clubbing.  Psychiatric: Normal mood and affect. speech and behavior is normal. Judgment and thought content normal. Cognition and memory are normal.          Assessment & Plan:

## 2011-02-25 NOTE — Patient Instructions (Addendum)
Please make followup appointment in 3-4 weeks. We will make an appointment for your with neurosurgeon for further evaluation and management. Start taking Neurontin as prescribed along with Vicodin and Advil. Takes Advil 400 mg 3 times a day for next 2 weeks. Stop taking Valium and prednisone.

## 2011-03-01 ENCOUNTER — Telehealth: Payer: Self-pay | Admitting: *Deleted

## 2011-03-03 ENCOUNTER — Encounter: Payer: Self-pay | Admitting: Internal Medicine

## 2011-03-03 MED ORDER — NITROGLYCERIN 0.4 MG SL SUBL: 0.4000 mg | SUBLINGUAL_TABLET | SUBLINGUAL | Status: DC

## 2011-03-03 NOTE — Telephone Encounter (Signed)
I called pt and she is NOT having any chest pain.  Her bottle of meds has expired and she needs refill. She usually gets a bottle # 25 not just 5 tablets.  Can you resend to [facility] for a full bottle?

## 2011-03-07 NOTE — Telephone Encounter (Signed)
The second Rx you wrote is for #5, can she not get a full bottle? You can e-scribe to [facility].

## 2011-03-08 MED ORDER — NITROGLYCERIN 0.4 MG SL SUBL: 0.4000 mg | SUBLINGUAL_TABLET | SUBLINGUAL | Status: DC

## 2011-03-08 NOTE — Telephone Encounter (Signed)
Rx called in 

## 2011-03-11 ENCOUNTER — Encounter: Payer: Self-pay | Admitting: Internal Medicine

## 2011-03-11 ENCOUNTER — Ambulatory Visit (INDEPENDENT_AMBULATORY_CARE_PROVIDER_SITE_OTHER): Payer: Medicare Other | Admitting: Internal Medicine

## 2011-03-11 VITALS — BP 123/78 | HR 81 | Temp 98.2°F | Ht 62.0 in | Wt 156.2 lb

## 2011-03-11 DIAGNOSIS — Z609 Problem related to social environment, unspecified: Secondary | ICD-10-CM

## 2011-03-11 DIAGNOSIS — Z7289 Other problems related to lifestyle: Secondary | ICD-10-CM

## 2011-03-11 DIAGNOSIS — M5126 Other intervertebral disc displacement, lumbar region: Secondary | ICD-10-CM

## 2011-03-11 DIAGNOSIS — M5116 Intervertebral disc disorders with radiculopathy, lumbar region: Secondary | ICD-10-CM

## 2011-03-11 DIAGNOSIS — I10 Essential (primary) hypertension: Secondary | ICD-10-CM

## 2011-03-11 MED ORDER — HYDROCODONE-ACETAMINOPHEN 5-500 MG PO TABS: 1.0000 | ORAL_TABLET | Freq: Four times a day (QID) | ORAL | Status: DC | PRN

## 2011-03-11 NOTE — Progress Notes (Signed)
Addended by: Lyn Hollingshead C on: 03/11/2011 03:43 PM   Modules accepted: Orders

## 2011-03-11 NOTE — Assessment & Plan Note (Signed)
Minimal improvement in symptoms with Advil  and Neurontin. Patient waiting for neurosurgical evaluation 03/15/2011. Inability to get Vicodin prescription filled due to some unknown issues. I will give her a prescription for Vicodin today 45 tablets without refills. I will see her back in 3-4 weeks-of explain her that if she feels better, hopefully she should by the end of 3 weeks, she can postpone the appointment

## 2011-03-11 NOTE — Progress Notes (Signed)
  Subjective:    Patient ID: Alexis Kline, female    DOB: 10-18-1945, 65 y.o.   MRN: [id number]  HPI Alexis Kline is a pleasant 48 year woman with past with history of hypertension, CAD, multiple surgeries who comes the clinic for followup visit after lumbar disc herniation with radiculopathy. I saw her in 02/25/2011 and give her prescription for Vicodin, ibuprofen and Neurontin and also refer her for neurosurgical evaluation-although I have low suspicion for her to be a surgical candidate and also she doesn't want to have any surgery, it would thought to be helpful for further conservative management. She is an appointment with neurosurgeon on 03/15/2011. Her pain is little better with improvement and Neurontin and she did not get the Vicodin prescription filled for some unclear reasons. Glenda called [facility], but they do not have any on coming for filling prescription for Vicodin. They have prescription for ibuprofen. Patient called the pharmacy around September 13 but she was told that she does not have prescription. She did not bother to call the clinic for the issue meanwhile. Today she complains of similar pain in the lower back go to the left leg, little better than before. I will give her one more prescription of Vicodin with 45 tablets today. She denies any fever, chills, headache, chest pain, short of breath, abdominal pain, nausea vomiting or diarrhea.  Review of Systems    as per history of present illness, all other systems reviewed and negative. Objective:   Physical Exam  Constitutional: Vital signs reviewed.  Patient is a well-developed and well-nourished in no acute distress and cooperative with exam. Alert and oriented x3.  Head: Normocephalic and atraumatic Mouth: no erythema or exudates, MMM Eyes: PERRL, EOMI, conjunctivae normal, No scleral icterus.  Neck: Supple, Trachea midline normal ROM, No JVD Cardiovascular: RRR, S1 normal, S2 normal, no MRG, pulses  symmetric and intact bilaterally Pulmonary/Chest: CTAB, no wheezes, rales, or rhonchi Abdominal: Soft. Non-tender, non-distended, bowel sounds are normal, no masses, organomegaly, or guarding present.  Musculoskeletal: Decreased range of motion of lower back pain. Lower lumbar spinal tenderness, no paraspinal tenderness.  Neurological: A&O x3, Strenght is normal and symmetric bilaterally, cranial nerve II-XII are grossly intact, no focal motor deficit, tingling and numbness left leg with minimally decreased sensations.   Skin: Warm, dry and intact. No rash, cyanosis, or clubbing.  Psychiatric: Normal mood and affect. speech and behavior is normal. Judgment and thought content normal. Cognition and memory are normal.         Assessment & Plan:

## 2011-03-11 NOTE — Patient Instructions (Addendum)
Please make an appointment in 3-4 weeks. Please follow with neurosurgeon on 03/15/2011 and follow the recommendations. Stop taking Advil after the bottle is empty. I will give her prescription for Vicodin with 45 tablets today. Take 1 tablet every 4-6 hours as needed for pain

## 2011-03-11 NOTE — Assessment & Plan Note (Signed)
Blood pressure 123/78. At goal. Continue same medication.

## 2011-03-14 LAB — CBC
HCT: 40.6
Hemoglobin: 13.5
MCHC: 33.2
MCV: 80.2
Platelets: 252
RBC: 5.06
RDW: 15.6 — ABNORMAL HIGH
WBC: 7.9

## 2011-03-14 LAB — LIPID PANEL
Cholesterol: 216 — ABNORMAL HIGH
HDL: 41
LDL Cholesterol: 132 — ABNORMAL HIGH
Total CHOL/HDL Ratio: 5.3
Triglycerides: 214 — ABNORMAL HIGH
VLDL: 43 — ABNORMAL HIGH

## 2011-03-14 LAB — COMPREHENSIVE METABOLIC PANEL
ALT: 16
AST: 27
Albumin: 3.8
Alkaline Phosphatase: 60
BUN: 11
CO2: 29
Calcium: 9.8
Chloride: 105
Creatinine, Ser: 0.79
GFR calc Af Amer: >60
GFR calc non Af Amer: >60
Glucose, Bld: 90
Potassium: 4.1
Sodium: 142
Total Bilirubin: 0.6
Total Protein: 7.3

## 2011-03-14 LAB — CARDIAC PANEL(CRET KIN+CKTOT+MB+TROPI)
CK, MB: 2
CK, MB: 2.4
Relative Index: 1.9
Relative Index: 2.2
Total CK: 104
Total CK: 108
Troponin I: 0.01
Troponin I: <0.01

## 2011-03-14 LAB — CK TOTAL AND CKMB (NOT AT ARMC)
CK, MB: 2.5
Relative Index: 2.4
Total CK: 104

## 2011-03-14 LAB — DIFFERENTIAL
Basophils Absolute: 0
Basophils Relative: 1
Eosinophils Absolute: 0.2
Eosinophils Relative: 2
Lymphocytes Relative: 29
Lymphs Abs: 2.3
Monocytes Absolute: 0.4
Monocytes Relative: 6
Neutro Abs: 5
Neutrophils Relative %: 63

## 2011-03-14 LAB — TROPONIN I: Troponin I: <0.01

## 2011-03-14 LAB — TSH: TSH: 1.34

## 2011-04-01 ENCOUNTER — Encounter: Payer: Self-pay | Admitting: Internal Medicine

## 2011-04-01 ENCOUNTER — Ambulatory Visit (INDEPENDENT_AMBULATORY_CARE_PROVIDER_SITE_OTHER): Payer: Medicare Other | Admitting: Internal Medicine

## 2011-04-01 DIAGNOSIS — I10 Essential (primary) hypertension: Secondary | ICD-10-CM

## 2011-04-01 DIAGNOSIS — R059 Cough, unspecified: Secondary | ICD-10-CM

## 2011-04-01 DIAGNOSIS — M5116 Intervertebral disc disorders with radiculopathy, lumbar region: Secondary | ICD-10-CM

## 2011-04-01 DIAGNOSIS — M5126 Other intervertebral disc displacement, lumbar region: Secondary | ICD-10-CM

## 2011-04-01 DIAGNOSIS — R058 Other specified cough: Secondary | ICD-10-CM

## 2011-04-01 DIAGNOSIS — R05 Cough: Secondary | ICD-10-CM

## 2011-04-01 DIAGNOSIS — E785 Hyperlipidemia, unspecified: Secondary | ICD-10-CM

## 2011-04-01 MED ORDER — HYDROCODONE-HOMATROPINE 5-1.5 MG/5ML PO SYRP: 5.0000 mL | ORAL_SOLUTION | Freq: Four times a day (QID) | ORAL | Status: AC | PRN

## 2011-04-01 NOTE — Assessment & Plan Note (Addendum)
Patient seems to have had an attack of acute bronchitis. She is a long-time smoker and also has mild COPD. Bronchitis and cough is getting better. Cough suppression needed now has coughing makes her back pain worse. I will prescribe Hycodan 5 mL every 6 hours as needed for cough suppression which will also likely help her low back pain. No signs of active infection. No antibiotics are further testing needed at this point of time.

## 2011-04-01 NOTE — Assessment & Plan Note (Signed)
Pain better with minimal rest and decreased over straining of lower back. Neurosurgical evaluation done by Dr. Mikal Plane. No new recommendations as per patient. She is supposed to get MRI-but still is waiting for appointment. I will try to get records from Dr. Jackelyn Knife office which will help for further management. Meanwhile continue same conservative management along with when necessary Vicodin.

## 2011-04-01 NOTE — Progress Notes (Signed)
  Subjective:    Patient ID: Alexis Kline, female    DOB: 11/17/1945, 65 y.o.   MRN: [id number]  HPI Alexis Kline is a pleasant 65 woman with past with history of HTN, hyperlipidemia, recent lumbar disc herniation who comes in the clinic for followup visit for her back pain. Her back pain is getting better. She does not need Vicodin everyday anymore. Her pill bottle of Vicodin is full. She saw Dr. Mikal Plane, neurosurgeon on 03/15/2011 but says that she did not give any new recommendations except they were planning to do MRI. She is not called for any appointment for MRI of lower back until today. She does complain of cough with yellow productive sputum for past week, low-grade fevers at home, sore throat -for one week. This all is getting better now.  sputum production is decreased significantly and has minimal sore throat now. Vitals today are normal and has no fever. She denies any chest pain, short of breath, abdominal pain, nausea vomiting, diarrhea.  Review of Systems    As per history of present illness, all other systems reviewed and negative.  Objective:   Physical Exam Vitals: Reviewed and stable. General: NAD HEENT: PERRL, EOMI, no scleral icterus, mild hyperemia of oropharyngeal mucosa. No lymphadenopathy. Cardiac: RRR, no rubs, murmurs or gallops Pulm: clear to auscultation bilaterally, moving normal volumes of air Abd: soft, nontender, nondistended, BS present Ext: Decreased range of motion of lower lumbar spine due to pain. Increased pain with bending forward. Pain radiates to left thigh. Neuro: alert and oriented X3, cranial nerves II-XII grossly intact, strength equal in bilateral upper and lower extremities. No significant sensation changes except some intermittent tingling and numbness in left leg.       Assessment & Plan:

## 2011-04-01 NOTE — Patient Instructions (Signed)
Please make followup appointment in 2-3 months. If your back pain starts getting worse meanwhile call and make an early appointment. I will give you a call if any changes need to be made. Meanwhile please keep on taking all her medications regularly. Take Hycodan cough syrup 5 mL every 6 hours as needed, which would help your back pain also.

## 2011-04-01 NOTE — Assessment & Plan Note (Signed)
Blood pressure at goal. Continue same medication.

## 2011-04-01 NOTE — Assessment & Plan Note (Signed)
-  Continue Pravachol ?

## 2011-04-04 LAB — CBC
HCT: 40.2
Hemoglobin: 13.4
MCHC: 33.4
MCV: 81.3
Platelets: 256
RBC: 4.95
RDW: 14.6 — ABNORMAL HIGH
WBC: 7.8

## 2011-04-04 LAB — BASIC METABOLIC PANEL
BUN: 16
CO2: 29
Calcium: 9.6
Chloride: 101
Creatinine, Ser: 0.74
GFR calc Af Amer: >60
GFR calc non Af Amer: >60
Glucose, Bld: 95
Potassium: 3.8
Sodium: 136

## 2011-04-04 LAB — DIFFERENTIAL
Basophils Absolute: 0
Basophils Relative: 0
Eosinophils Absolute: 0.2
Eosinophils Relative: 3
Lymphocytes Relative: 30
Lymphs Abs: 2.4
Monocytes Absolute: 0.6
Monocytes Relative: 8
Neutro Abs: 4.6
Neutrophils Relative %: 59

## 2011-04-12 ENCOUNTER — Telehealth: Payer: Self-pay | Admitting: Licensed Clinical Social Worker

## 2011-04-14 ENCOUNTER — Telehealth: Payer: Self-pay | Admitting: Licensed Clinical Social Worker

## 2011-04-14 NOTE — Telephone Encounter (Signed)
See tel. Note of 04/14/11

## 2011-04-14 NOTE — Telephone Encounter (Signed)
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  Her check is $782 and her rent is under $250 per month.  She is not having success being moved into public housing or section 8.  I have advised her to work w/ the [employer] and have provided several housing options including [place], [facility], and [facility].  She is very negative about pursuing those options and said she had already worked w/ [facility] before and is unsure about the areas where the other apartments are located.   Patient said she has a letter of support from her doctor stating that due to health issues [facility] [place] is not suitable.  Unfortunately, there is a Research officer, trade union in this community and [employer] remains one of the primary housing high rises for the disabled and elderly.   She is amenable to my sending on various housing options in the community as conditions at [facility] are not likely to change.  I will also email social worker Pennie Banter to see if she can assist her in facilitating a move.

## 2011-04-28 ENCOUNTER — Other Ambulatory Visit: Payer: Self-pay | Admitting: Internal Medicine

## 2011-06-15 ENCOUNTER — Encounter: Payer: Medicare Other | Admitting: Internal Medicine

## 2011-06-16 ENCOUNTER — Encounter: Payer: Medicare Other | Admitting: Internal Medicine

## 2011-06-29 ENCOUNTER — Ambulatory Visit (INDEPENDENT_AMBULATORY_CARE_PROVIDER_SITE_OTHER): Payer: Medicare Other | Admitting: Internal Medicine

## 2011-06-29 ENCOUNTER — Encounter: Payer: Self-pay | Admitting: Internal Medicine

## 2011-06-29 DIAGNOSIS — I10 Essential (primary) hypertension: Secondary | ICD-10-CM

## 2011-06-29 DIAGNOSIS — M5126 Other intervertebral disc displacement, lumbar region: Secondary | ICD-10-CM

## 2011-06-29 DIAGNOSIS — M6281 Muscle weakness (generalized): Secondary | ICD-10-CM | POA: Diagnosis not present

## 2011-06-29 DIAGNOSIS — R29898 Other symptoms and signs involving the musculoskeletal system: Secondary | ICD-10-CM

## 2011-06-29 DIAGNOSIS — M5116 Intervertebral disc disorders with radiculopathy, lumbar region: Secondary | ICD-10-CM

## 2011-06-29 NOTE — Assessment & Plan Note (Signed)
This left arm weakness might be from her multiple surgeries in past- but cervical spinal involvement is to be ruled out per Dr. Mikal Plane.  I will order MRI cervical spine today and follow up with the result.

## 2011-06-29 NOTE — Assessment & Plan Note (Signed)
As described in history of present illness, patient did not receive MRI after neurosurgery clinic visit. She still continues to have similar pain which is less severe than before. I would order MRI lumbar spine and cervical spine today as per Dr. Jackelyn Knife office visit evaluation in September 2012.  Will do further followup with possible neurosurgical appointment or physical therapy. She is agreeable to the plan.

## 2011-06-29 NOTE — Progress Notes (Signed)
  Subjective:    Patient ID: Alexis Kline, female    DOB: September 21, 1945, 66 y.o.   MRN: [id number]  HPI Alexis Kline is a pleasant 66 year woman with past with history of hypertension, hyperlipidemia, CAD, COPD, lumbar disc herniation and multiple surgeries in her hand who comes to the clinic for followup visit for low back pain. She had lumbar disc herniation with radiculopathy in September 2012 during which she was referred to neurosurgery and Dr. Franky Macho saw her. His note mentions that she will get MRI of cervical spine and lumbar spine and see her back in the clinic- patient states that she was not call for any MRI appointment and she does not like to see Dr. Mikal Plane.  She still continues to have low back pain with intermittent radiation to left leg up to foot- but less severe than in September 2012. She is able to walk with minimal pain at present. Today she walked about a mile without any pain- but started having pain when she stopped.  She also complains of left arm weakness - which she also described to Dr. Franky Macho.  She denies any loss of bowel or bladder functions, fever, chills, nausea vomiting, headache, chest pain, short of breath, abdominal pain.  She is a complaints of ringing in bilateral ears which has been evaluated by ENT in past without any specific diagnosis. But it does not bother her much as her back and arm.  Review of Systems    as per history of present illness, all other systems reviewed and negative. Objective:   Physical Exam General: NAD HEENT: PERRL, EOMI, no scleral icterus Cardiac: S1, S2, RRR, no rubs, murmurs or gallops Pulm: clear to auscultation bilaterally, moving normal volumes of air Abd: soft, nontender, nondistended, BS present Musculoskeletal: Moderate lower lumbar spinal and left paraspinal tenderness to palpation. No significant cervical or thoracic spinal or paraspinal tenderness. Ext: Handgrip weakness on left side. Left arm weakness  present. Neuro: alert and oriented X3, cranial nerves II-XII grossly intact        Assessment & Plan:

## 2011-06-29 NOTE — Assessment & Plan Note (Signed)
Lab Results  Component Value Date   NA 141 07/28/2010   K 4.1 07/28/2010   CL 107 07/28/2010   CO2 23 07/28/2010   BUN 13 07/28/2010   CREATININE 0.65 07/28/2010   CREATININE 0.71 12/24/2009    BP Readings from Last 3 Encounters:  06/29/11 125/94  04/01/11 120/79  03/11/11 123/78    Assessment: Hypertension control:  controlled  Progress toward goals:  at goal Barriers to meeting goals:  no barriers identified  Plan: Hypertension treatment:  continue current medications

## 2011-06-29 NOTE — Patient Instructions (Signed)
Please make an appointment in 2-3 months. I will give you a call with the results of MRI and discuss about further management from there.  If anything comes up meanwhile please give [place] a call to make an early appointment.  Take all your medications regularly.

## 2011-07-04 ENCOUNTER — Telehealth: Payer: Self-pay | Admitting: *Deleted

## 2011-07-04 ENCOUNTER — Other Ambulatory Visit: Payer: Self-pay | Admitting: Internal Medicine

## 2011-07-04 NOTE — Telephone Encounter (Signed)
Agree 

## 2011-07-04 NOTE — Telephone Encounter (Signed)
Call from pt ate [employer] on Wednesday.  Thursday started having diarrhea/soft stools. No abdomen cramping intermittent Nausea.  When she goes to the bathroom the nausea goes away.  Goes about 4-6 times a day.  Said that she feels heat in her stomach.  Has used Pepto Bismol with no relief.  Burning in abdomen at first has subsided.  Uses [facility].  Pt said that she has been eating broccoli and fruit.  Pt was advised to eat Chicken Broth, mashed potatoes, rice , toast and soft foods  to rest her stomach.  Drink plenty of fluids.  Angelina Ok, RN 07/04/2011.

## 2011-07-05 ENCOUNTER — Other Ambulatory Visit (HOSPITAL_COMMUNITY): Payer: Medicare Other

## 2011-07-05 ENCOUNTER — Inpatient Hospital Stay (HOSPITAL_COMMUNITY): Admission: RE | Admit: 2011-07-05 | Payer: Medicare Other | Source: Ambulatory Visit

## 2011-07-05 MED ORDER — ASPIRIN 81 MG PO TBEC: 81.0000 mg | DELAYED_RELEASE_TABLET | Freq: Every day | ORAL | Status: DC

## 2011-07-08 NOTE — Progress Notes (Signed)
Resch both   MRI at [facility] 07/15/11 11AM - pt aware. Stanton Kidney Stefania Goulart RN 07/08/11 12:30PM

## 2011-07-15 ENCOUNTER — Other Ambulatory Visit (HOSPITAL_COMMUNITY): Payer: Medicare Other

## 2011-07-15 ENCOUNTER — Inpatient Hospital Stay (HOSPITAL_COMMUNITY): Admission: RE | Admit: 2011-07-15 | Payer: Medicare Other | Source: Ambulatory Visit

## 2011-07-21 ENCOUNTER — Ambulatory Visit (HOSPITAL_COMMUNITY)
Admission: RE | Admit: 2011-07-21 | Discharge: 2011-07-21 | Disposition: A | Discharge status: Home / Self Care | Payer: Medicare Other | Source: Ambulatory Visit | Attending: Internal Medicine | Admitting: Internal Medicine

## 2011-07-21 DIAGNOSIS — R29898 Other symptoms and signs involving the musculoskeletal system: Secondary | ICD-10-CM

## 2011-07-21 DIAGNOSIS — G950 Syringomyelia and syringobulbia: Secondary | ICD-10-CM | POA: Diagnosis not present

## 2011-07-21 DIAGNOSIS — M79609 Pain in unspecified limb: Secondary | ICD-10-CM | POA: Insufficient documentation

## 2011-07-21 DIAGNOSIS — E282 Polycystic ovarian syndrome: Secondary | ICD-10-CM | POA: Insufficient documentation

## 2011-07-21 DIAGNOSIS — M5030 Other cervical disc degeneration, unspecified cervical region: Secondary | ICD-10-CM | POA: Insufficient documentation

## 2011-07-21 DIAGNOSIS — M25519 Pain in unspecified shoulder: Secondary | ICD-10-CM | POA: Insufficient documentation

## 2011-07-21 DIAGNOSIS — M5116 Intervertebral disc disorders with radiculopathy, lumbar region: Secondary | ICD-10-CM

## 2011-07-21 DIAGNOSIS — K7689 Other specified diseases of liver: Secondary | ICD-10-CM | POA: Insufficient documentation

## 2011-07-21 DIAGNOSIS — M5126 Other intervertebral disc displacement, lumbar region: Secondary | ICD-10-CM | POA: Diagnosis not present

## 2011-07-21 DIAGNOSIS — M47817 Spondylosis without myelopathy or radiculopathy, lumbosacral region: Secondary | ICD-10-CM | POA: Diagnosis not present

## 2011-07-21 DIAGNOSIS — M47812 Spondylosis without myelopathy or radiculopathy, cervical region: Secondary | ICD-10-CM | POA: Diagnosis not present

## 2011-08-17 ENCOUNTER — Other Ambulatory Visit: Payer: Self-pay | Admitting: Internal Medicine

## 2011-08-22 ENCOUNTER — Other Ambulatory Visit: Payer: Self-pay | Admitting: *Deleted

## 2011-08-22 MED ORDER — OMEPRAZOLE 40 MG PO CPDR: 40.0000 mg | DELAYED_RELEASE_CAPSULE | Freq: Every day | ORAL | Status: DC

## 2011-08-22 MED ORDER — CARVEDILOL 12.5 MG PO TABS: 12.5000 mg | ORAL_TABLET | Freq: Two times a day (BID) | ORAL | Status: DC

## 2011-08-25 NOTE — Telephone Encounter (Signed)
Phone to pharmacy  

## 2011-09-07 ENCOUNTER — Ambulatory Visit (INDEPENDENT_AMBULATORY_CARE_PROVIDER_SITE_OTHER): Payer: Medicare Other | Admitting: Internal Medicine

## 2011-09-07 ENCOUNTER — Encounter: Payer: Self-pay | Admitting: Internal Medicine

## 2011-09-07 VITALS — BP 130/88 | HR 75 | Temp 96.4°F | Ht 62.0 in | Wt 156.6 lb

## 2011-09-07 DIAGNOSIS — I10 Essential (primary) hypertension: Secondary | ICD-10-CM | POA: Diagnosis not present

## 2011-09-07 DIAGNOSIS — G950 Syringomyelia and syringobulbia: Secondary | ICD-10-CM | POA: Insufficient documentation

## 2011-09-07 DIAGNOSIS — M5116 Intervertebral disc disorders with radiculopathy, lumbar region: Secondary | ICD-10-CM

## 2011-09-07 MED ORDER — HYDROCODONE-ACETAMINOPHEN 5-500 MG PO TABS: 1.0000 | ORAL_TABLET | Freq: Four times a day (QID) | ORAL | Status: DC | PRN

## 2011-09-07 NOTE — Progress Notes (Signed)
  Subjective:    Patient ID: Alexis Kline, female    DOB: 09/04/1945, 66 y.o.   MRN: [id number]  HPI Alexis Kline is a pleasant 66 year woman with past history of hypertension, CAD, multiple surgeries, lumbar disc herniation who comes to the clinic for a followup visit for back and neck pain. She fell down 2 weeks before while walking back to her apartment and started having neck after that. Did not hit her head. No scalp bleeding. No loss of consciousness. No headache or change in vision.   She was evaluated by Dr. Mikal Plane in September 2012 for low back pain and was recommended to have cervical and lumbar spine MRI- which did not happen until January 2013 when I saw her. 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  Her pain in lower extremities and neck is little bit worse than before- but does not have any loss of bowel or bladder function. She is able to walk slowly with pain but no apparent foot drop. She is not using cane today but reports having one at home.    she denies any fever, chills, chest pain, palpitations, shortness of breath, urinary incontinence, diarrhea.     Review of Systems     as per history of present illness, all other systems reviewed and negative.  Objective:   Physical Exam  General: NAD HEENT: PERRL, EOMI, no scleral icterus Cardiac: RRR, no rubs, murmurs or gallops Pulm: clear to auscultation bilaterally, moving normal volumes of air Abd: soft, nontender, nondistended, BS present Musculoskeletal: Decreased range of motion of lower lumbar spine due to pain.  Ext: warm and well perfused, no pedal edema.  Neuro: alert and oriented X3, cranial nerves II-XII grossly intact       Assessment & Plan:

## 2011-09-07 NOTE — Assessment & Plan Note (Signed)
Re-referral to Dr. Mikal Plane- appointment on 09/13/2011. MRI thoracic spine with and without contrast on 09/09/2011. All information given to patient. I will see her back in 3-4 weeks after Dr. Jackelyn Knife appointment.

## 2011-09-07 NOTE — Patient Instructions (Signed)
Clinic will give a call with appointment with neurosurgeon. If you don't get a call in about 10 days, call this clinic for neurosurgery appointment on [phone].  If you have any severe worsening of pain, weakness or loss of bowel or bladder control- give [place] a call to make an early appointment.  Make an appointment in 3-4 weeks after seeing neurosurgeon.

## 2011-09-07 NOTE — Assessment & Plan Note (Signed)
Lab Results  Component Value Date   NA 141 07/28/2010   K 4.1 07/28/2010   CL 107 07/28/2010   CO2 23 07/28/2010   BUN 13 07/28/2010   CREATININE 0.65 07/28/2010   CREATININE 0.71 12/24/2009    BP Readings from Last 3 Encounters:  09/07/11 130/88  06/29/11 125/94  04/01/11 120/79    Assessment: Hypertension control:  controlled  Progress toward goals:  at goal Barriers to meeting goals:  no barriers identified  Plan: Hypertension treatment:  continue current medications

## 2011-09-08 NOTE — Progress Notes (Signed)
Pt aware of appt MRI  [facility] 09/09/11 11AM. Sherica Paternostro RN 09/08/11 10:30AM

## 2011-09-09 ENCOUNTER — Inpatient Hospital Stay (HOSPITAL_COMMUNITY): Admission: RE | Admit: 2011-09-09 | Payer: Medicare Other | Source: Ambulatory Visit

## 2011-09-20 ENCOUNTER — Other Ambulatory Visit: Payer: Self-pay | Admitting: *Deleted

## 2011-09-20 ENCOUNTER — Other Ambulatory Visit: Payer: Self-pay | Admitting: Internal Medicine

## 2011-09-20 DIAGNOSIS — Z1231 Encounter for screening mammogram for malignant neoplasm of breast: Secondary | ICD-10-CM

## 2011-09-20 DIAGNOSIS — I10 Essential (primary) hypertension: Secondary | ICD-10-CM

## 2011-09-20 NOTE — Progress Notes (Signed)
Call from radiology dept.  Pt scheduled for MRI tomorrow am.  Needs recent bun/creat. Order placed for lab draw prior to MRI. Will send to pcp for signature.Alexis Spittle Cassady4/2/20133:05 PM

## 2011-09-21 ENCOUNTER — Ambulatory Visit (HOSPITAL_COMMUNITY)
Admission: RE | Admit: 2011-09-21 | Discharge: 2011-09-21 | Disposition: A | Discharge status: Home / Self Care | Payer: Medicare Other | Source: Ambulatory Visit | Attending: Internal Medicine | Admitting: Internal Medicine

## 2011-09-21 DIAGNOSIS — M5144 Schmorl's nodes, thoracic region: Secondary | ICD-10-CM | POA: Insufficient documentation

## 2011-09-21 DIAGNOSIS — M5116 Intervertebral disc disorders with radiculopathy, lumbar region: Secondary | ICD-10-CM

## 2011-09-21 DIAGNOSIS — G950 Syringomyelia and syringobulbia: Secondary | ICD-10-CM | POA: Diagnosis not present

## 2011-09-21 DIAGNOSIS — K7689 Other specified diseases of liver: Secondary | ICD-10-CM | POA: Insufficient documentation

## 2011-09-21 DIAGNOSIS — M5126 Other intervertebral disc displacement, lumbar region: Secondary | ICD-10-CM | POA: Insufficient documentation

## 2011-09-21 DIAGNOSIS — M549 Dorsalgia, unspecified: Secondary | ICD-10-CM | POA: Diagnosis not present

## 2011-09-21 DIAGNOSIS — Q064 Hydromyelia: Secondary | ICD-10-CM | POA: Diagnosis not present

## 2011-09-21 LAB — BASIC METABOLIC PANEL
BUN: 15 mg/dL (ref 6–23)
CO2: 27 mEq/L (ref 19–32)
Calcium: 9.5 mg/dL (ref 8.4–10.5)
Chloride: 103 mEq/L (ref 96–112)
Creatinine, Ser: 0.72 mg/dL (ref 0.50–1.10)
GFR calc Af Amer: >90 mL/min (ref >90)
GFR calc non Af Amer: 88 mL/min — ABNORMAL LOW (ref >90)
Glucose, Bld: 113 mg/dL — ABNORMAL HIGH (ref 70–99)
Potassium: 3.7 mEq/L (ref 3.5–5.1)
Sodium: 139 mEq/L (ref 135–145)

## 2011-09-21 MED ORDER — GADOBENATE DIMEGLUMINE 529 MG/ML IV SOLN
14.0000 mL | Freq: Once | INTRAVENOUS | Status: AC
  Administered 2011-09-21: 14 mL via INTRAVENOUS

## 2011-10-17 ENCOUNTER — Encounter: Payer: Medicare Other | Admitting: Internal Medicine

## 2011-10-20 ENCOUNTER — Ambulatory Visit (HOSPITAL_COMMUNITY): Payer: Medicare Other

## 2011-10-25 ENCOUNTER — Ambulatory Visit (HOSPITAL_COMMUNITY): Payer: Medicare Other

## 2011-10-26 ENCOUNTER — Encounter: Payer: Medicare Other | Admitting: Internal Medicine

## 2011-11-16 ENCOUNTER — Encounter: Payer: Self-pay | Admitting: Internal Medicine

## 2011-11-16 ENCOUNTER — Ambulatory Visit (INDEPENDENT_AMBULATORY_CARE_PROVIDER_SITE_OTHER): Payer: Medicare Other | Admitting: Internal Medicine

## 2011-11-16 VITALS — BP 124/81 | HR 68 | Temp 97.4°F | Wt 160.3 lb

## 2011-11-16 DIAGNOSIS — E785 Hyperlipidemia, unspecified: Secondary | ICD-10-CM

## 2011-11-16 DIAGNOSIS — J449 Chronic obstructive pulmonary disease, unspecified: Secondary | ICD-10-CM

## 2011-11-16 DIAGNOSIS — G950 Syringomyelia and syringobulbia: Secondary | ICD-10-CM

## 2011-11-16 DIAGNOSIS — Z23 Encounter for immunization: Secondary | ICD-10-CM | POA: Diagnosis not present

## 2011-11-16 DIAGNOSIS — J4489 Other specified chronic obstructive pulmonary disease: Secondary | ICD-10-CM

## 2011-11-16 DIAGNOSIS — I2510 Atherosclerotic heart disease of native coronary artery without angina pectoris: Secondary | ICD-10-CM | POA: Diagnosis not present

## 2011-11-16 DIAGNOSIS — I10 Essential (primary) hypertension: Secondary | ICD-10-CM

## 2011-11-16 MED ORDER — CARVEDILOL 12.5 MG PO TABS: 12.5000 mg | ORAL_TABLET | Freq: Two times a day (BID) | ORAL | Status: DC

## 2011-11-16 NOTE — Patient Instructions (Signed)
Please make followup appointment in 6-8 weeks. Followup with neurosurgery clinic at [facility] on 11/24/2011 at 9 AM. Take all necessary documents and CD at the appointment.  Meanwhile keep taking all the medications regularly.  Call for any concerns or early appointment if needed.

## 2011-11-17 ENCOUNTER — Ambulatory Visit (HOSPITAL_COMMUNITY)
Admission: RE | Admit: 2011-11-17 | Discharge: 2011-11-17 | Disposition: A | Discharge status: Home / Self Care | Payer: Medicare Other | Source: Ambulatory Visit | Attending: Internal Medicine | Admitting: Internal Medicine

## 2011-11-17 DIAGNOSIS — Z1231 Encounter for screening mammogram for malignant neoplasm of breast: Secondary | ICD-10-CM

## 2011-11-17 NOTE — Assessment & Plan Note (Signed)
Stable at present. Does not have chest pain no short of breath. Continue aspirin, beta blocker, statin.

## 2011-11-17 NOTE — Assessment & Plan Note (Signed)
Continue Pravachol. Yearly lipid panel.

## 2011-11-17 NOTE — Assessment & Plan Note (Signed)
Patient was referred to neurosurgery [facility] where she has an appointment coming on 11/24/2011 at 8 AM. She is clinically worse a little bit in terms of pain- but has an appointment coming up so wouldn't do anything different today. Continue when necessary Vicodin- which she takes hardly once a month. Off Neurontin due to imbalance issues.  She does not want surgical approach- unless there is organ saving. She is okay for physical therapy,  acupuncture or other kinds of therapy.

## 2011-11-17 NOTE — Assessment & Plan Note (Signed)
Lab Results  Component Value Date   NA 139 09/21/2011   K 3.7 09/21/2011   CL 103 09/21/2011   CO2 27 09/21/2011   BUN 15 09/21/2011   CREATININE 0.72 09/21/2011   CREATININE 0.65 07/28/2010    BP Readings from Last 3 Encounters:  11/16/11 124/81  09/07/11 130/88  06/29/11 125/94    Assessment: Hypertension control:  controlled  Progress toward goals:  at goal Barriers to meeting goals:  no barriers identified  Plan: Hypertension treatment:  continue current medications

## 2011-11-17 NOTE — Progress Notes (Signed)
  Subjective:    Patient ID: Alexis Kline, female    DOB: 04-26-1946, 66 y.o.   MRN: [id number]  HPI Ms. Moler is a pleasant 66 year woman with past history of chronic low back pain in her with lumbar disc herniation,  Syringomyelia, hypertension, CAD who comes in clinic for followup visit. Patient has had MRI of C-spine, thoracic spine and lumbar spine which showed changes concerning for syringo- hydromyelia. Before the MRI she was seen by a neurosurgeon, but has not seen any after MRI results. She has an appointment on 11/24/2011 at 8 AM at [facility]. She is going to take CD results of MRI and CT scan with her- she already has it with her today. She says that her pain is getting worse slowly- start from the neck and goes up to the lower back- sharp shooting. Her gait is not getting terribly worse. Usually her pain is well controlled, has only one really bad day in which she cannot get out of the bed. Last fall she has about 2 months before. Does not have any fever, chills, nausea, vomiting, abdominal pain, chest pain, short of breath.    Review of Systems     As per history of present illness, all other systems reviewed and negative. Objective:   Physical Exam  General: NAD HEENT: PERRL, EOMI, no scleral icterus Cardiac: RRR, no rubs, murmurs or gallops Pulm: clear to auscultation bilaterally, moving normal volumes of air Abd: soft, nontender, nondistended, BS present Ext: warm and well perfused, no pedal edema. Mild to moderate tenderness to palpation of spine- mostly on cervical and lumbar. Neuro: alert and oriented X3, cranial nerves II-XII grossly intact       Assessment & Plan:

## 2011-11-24 DIAGNOSIS — G950 Syringomyelia and syringobulbia: Secondary | ICD-10-CM | POA: Diagnosis not present

## 2011-12-28 ENCOUNTER — Encounter: Payer: Self-pay | Admitting: Internal Medicine

## 2011-12-28 ENCOUNTER — Ambulatory Visit (INDEPENDENT_AMBULATORY_CARE_PROVIDER_SITE_OTHER): Payer: Medicare Other | Admitting: Internal Medicine

## 2011-12-28 VITALS — BP 124/78 | HR 86 | Temp 97.7°F | Ht 62.0 in | Wt 158.8 lb

## 2011-12-28 DIAGNOSIS — M6281 Muscle weakness (generalized): Secondary | ICD-10-CM | POA: Diagnosis not present

## 2011-12-28 DIAGNOSIS — M5116 Intervertebral disc disorders with radiculopathy, lumbar region: Secondary | ICD-10-CM

## 2011-12-28 DIAGNOSIS — I10 Essential (primary) hypertension: Secondary | ICD-10-CM | POA: Diagnosis not present

## 2011-12-28 DIAGNOSIS — R29898 Other symptoms and signs involving the musculoskeletal system: Secondary | ICD-10-CM

## 2011-12-28 DIAGNOSIS — G950 Syringomyelia and syringobulbia: Secondary | ICD-10-CM

## 2011-12-28 DIAGNOSIS — M5126 Other intervertebral disc displacement, lumbar region: Secondary | ICD-10-CM | POA: Diagnosis not present

## 2011-12-28 NOTE — Progress Notes (Signed)
  Subjective:    Patient ID: Alexis Kline, female    DOB: [birth year], 66 y.o.   MRN: [id number]  HPI patient is a pleasant 66 year woman with past history of hypertension, CAD, traumatic syringomyelia who comes to the clinic for followup visit for back pain.  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 Patient does not want to have surgery unless the last option. Neurosurgeon recommended physical therapy and possible chiropractic management without any need of surgical intervention at this point in time. I am unable to find his notes at present- discussed about this but nursing staff and found out that it takes about 2 weeks for it to get scanned.  Patient feels at baseline. Is interested in getting physical therapy of a chiropractor management. Patient had no new fevers, chills, nausea vomiting, abdominal pain, chest pain, shortness of breath.     Review of Systems     as per history of present illness, all other systems reviewed and negative. Objective:   Physical Exam  General: NAD HEENT: PERRL, EOMI, no scleral icterus Cardiac: RRR, no rubs, murmurs or gallops Pulm: clear to auscultation bilaterally, moving normal volumes of air Abd: soft, nontender, nondistended, BS present Musculoskeletal: Mild tenderness to palpation in lower lumbar spine.  Neuro: alert and oriented X3, cranial nerves II-XII grossly intact       Assessment & Plan:

## 2011-12-28 NOTE — Assessment & Plan Note (Signed)
Lab Results  Component Value Date   NA 139 09/21/2011   K 3.7 09/21/2011   CL 103 09/21/2011   CO2 27 09/21/2011   BUN 15 09/21/2011   CREATININE 0.72 09/21/2011   CREATININE 0.65 07/28/2010    BP Readings from Last 3 Encounters:  12/28/11 124/78  11/16/11 124/81  09/07/11 130/88    Assessment: Hypertension control:  controlled  Progress toward goals:  at goal Barriers to meeting goals:  no barriers identified  Plan: Hypertension treatment:  continue current medications

## 2011-12-28 NOTE — Patient Instructions (Signed)
Please followup with chiropractor and physical therapy for the back pain. Make an appointment in 3-4 months or earlier if needed. Please take all medications regularly.

## 2011-12-28 NOTE — Assessment & Plan Note (Signed)
She was seen by neurosurgeon at [facility] last month.  If I correctly remember, they recommend nonsurgical management with physical therapy and possible chiropractic therapy. Although not able to find notes as scanning takes about 2 weeks. Will followup with their recommendations. Also will refer for physical therapy today. Patient also interested in chiropractic management- unclear if that will help patient and also unclear if Medicare covers that.

## 2012-01-02 DIAGNOSIS — M5137 Other intervertebral disc degeneration, lumbosacral region: Secondary | ICD-10-CM | POA: Diagnosis not present

## 2012-01-02 DIAGNOSIS — M999 Biomechanical lesion, unspecified: Secondary | ICD-10-CM | POA: Diagnosis not present

## 2012-01-02 DIAGNOSIS — M9981 Other biomechanical lesions of cervical region: Secondary | ICD-10-CM | POA: Diagnosis not present

## 2012-01-05 DIAGNOSIS — M9981 Other biomechanical lesions of cervical region: Secondary | ICD-10-CM | POA: Diagnosis not present

## 2012-01-05 DIAGNOSIS — M5137 Other intervertebral disc degeneration, lumbosacral region: Secondary | ICD-10-CM | POA: Diagnosis not present

## 2012-01-05 DIAGNOSIS — M999 Biomechanical lesion, unspecified: Secondary | ICD-10-CM | POA: Diagnosis not present

## 2012-01-20 ENCOUNTER — Other Ambulatory Visit: Payer: Self-pay | Admitting: Internal Medicine

## 2012-01-20 DIAGNOSIS — M5116 Intervertebral disc disorders with radiculopathy, lumbar region: Secondary | ICD-10-CM

## 2012-01-20 NOTE — Telephone Encounter (Signed)
Last filled 09/07/11

## 2012-01-23 MED ORDER — HYDROCODONE-ACETAMINOPHEN 5-500 MG PO TABS: 1.0000 | ORAL_TABLET | Freq: Four times a day (QID) | ORAL | Status: DC | PRN

## 2012-01-24 NOTE — Telephone Encounter (Signed)
Rx called in 

## 2012-01-30 NOTE — Addendum Note (Signed)
Addended by: Neomia Dear on: 01/30/2012 06:23 PM   Modules accepted: Orders

## 2012-01-31 DIAGNOSIS — M5137 Other intervertebral disc degeneration, lumbosacral region: Secondary | ICD-10-CM | POA: Diagnosis not present

## 2012-01-31 DIAGNOSIS — M9981 Other biomechanical lesions of cervical region: Secondary | ICD-10-CM | POA: Diagnosis not present

## 2012-01-31 DIAGNOSIS — M999 Biomechanical lesion, unspecified: Secondary | ICD-10-CM | POA: Diagnosis not present

## 2012-04-18 ENCOUNTER — Encounter: Payer: Medicare Other | Admitting: Internal Medicine

## 2012-04-23 ENCOUNTER — Ambulatory Visit (INDEPENDENT_AMBULATORY_CARE_PROVIDER_SITE_OTHER): Payer: Medicare Other | Admitting: Internal Medicine

## 2012-04-23 ENCOUNTER — Encounter: Payer: Self-pay | Admitting: Internal Medicine

## 2012-04-23 VITALS — BP 139/93 | HR 65 | Temp 97.1°F | Wt 161.2 lb

## 2012-04-23 DIAGNOSIS — I2510 Atherosclerotic heart disease of native coronary artery without angina pectoris: Secondary | ICD-10-CM

## 2012-04-23 DIAGNOSIS — I10 Essential (primary) hypertension: Secondary | ICD-10-CM | POA: Diagnosis not present

## 2012-04-23 DIAGNOSIS — G950 Syringomyelia and syringobulbia: Secondary | ICD-10-CM

## 2012-04-23 DIAGNOSIS — K5900 Constipation, unspecified: Secondary | ICD-10-CM | POA: Diagnosis not present

## 2012-04-23 MED ORDER — POLYETHYLENE GLYCOL 3350 17 GM/SCOOP PO POWD: 17.0000 g | Freq: Every day | ORAL | Status: DC

## 2012-04-23 MED ORDER — SENNOSIDES-DOCUSATE SODIUM 8.6-50 MG PO TABS: 2.0000 | ORAL_TABLET | Freq: Every day | ORAL | Status: DC

## 2012-04-23 NOTE — Assessment & Plan Note (Signed)
Stable.  Continue aspirin, beta blocker, statin

## 2012-04-23 NOTE — Patient Instructions (Addendum)
Please make a follow up appointment in 2-3 weeks. Start taking Miralax 17 gm daily and Senokot 2 tabs daily for constipation and drink about half to one day gallon of water every day. Keep taking all medications regularly.

## 2012-04-23 NOTE — Assessment & Plan Note (Signed)
Lab Results  Component Value Date   NA 139 09/21/2011   K 3.7 09/21/2011   CL 103 09/21/2011   CO2 27 09/21/2011   BUN 15 09/21/2011   CREATININE 0.72 09/21/2011   CREATININE 0.65 07/28/2010    BP Readings from Last 3 Encounters:  04/23/12 139/93  12/28/11 124/78  11/16/11 124/81    Assessment: Hypertension control:  controlled  Progress toward goals:  at goal Barriers to meeting goals:  no barriers identified  Plan: Hypertension treatment:  continue current medications. Continue Coreg, HCTZ.   

## 2012-04-23 NOTE — Assessment & Plan Note (Signed)
Unfortunately this will be a chronic medical management issue for patient. Not a surgical candidate per neurosurgery at [facility]. Chiropractic therapy did not help her. She is not ready for physical therapy- although she called her earlier while she was getting chiropractic therapy and she was told that she cannot get both together. She has to get one at a time. I will continue symptomatic management at this point of time.

## 2012-04-23 NOTE — Progress Notes (Signed)
  Subjective:    Patient ID: Alexis Kline, female    DOB: May 12, 1946, 66 y.o.   MRN: [id number]  HPI patient is a pleasant 66 year woman with past history of hypertension, CAD, syringomyelia who comes the clinic for regular followup visit. Pt compliants of constipation x 1 months. Hard stool, marble like. No new medications. No blood in stool or change of color of stool. She has bowel movements once a week for past one month. She tried over-the-counter fibers, Dulcolax tablets and Fleet enema which did not help her. She denies any fever, chills, nausea vomiting, abdominal pain, chest pain, short of breath.  For her back pain and neck pain- which are similar as before, she she got chiropractor therapy 3-4 times- which did not help her much. She feels better for about 20 minutes and as soon as she sits in a bus to go back home, she starts having the pain again. She refused to go to physical therapy at this point of time and neurosurgery evaluation at [facility] did not recommend surgery.    Review of Systems    As per history of present illness, all other systems reviewed and negative.  Objective:   Physical Exam  General: NAD  HEENT: PERRL, EOMI, no scleral icterus  Cardiac: RRR, no rubs, murmurs or gallops  Pulm: clear to auscultation bilaterally, moving normal volumes of air  Abd: soft, nontender, mildly distended, BS present  Ext: warm and well perfused, no pedal edema.  Mild to moderate tenderness to palpation of spine- mostly on cervical and lumbar spine.  Neuro: alert and oriented X3, cranial nerves II-XII grossly intact       Assessment & Plan:

## 2012-04-23 NOTE — Assessment & Plan Note (Addendum)
Unclear etiology. No new medications. She is on Vicodin but is not using it frequently enough to cause constipation. She takes one pill every 10 days or so. Does not have any nausea, vomiting, severe abdominal pain. 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 Last colonoscopy in 2006 was normal. Will symptomatically treat with Senokot-S 2 tablets twice daily and MiraLAX 17 g daily. If this does not help, I will see her back and consider Dulcolax suppositories versus enemas versus GI referral as felt needed. Also requested to drink about 2 L of water every day.

## 2012-05-02 ENCOUNTER — Encounter: Payer: Medicare Other | Admitting: Internal Medicine

## 2012-05-09 ENCOUNTER — Encounter: Payer: Medicare Other | Admitting: Internal Medicine

## 2012-05-16 ENCOUNTER — Encounter: Payer: Self-pay | Admitting: Internal Medicine

## 2012-05-16 ENCOUNTER — Ambulatory Visit (INDEPENDENT_AMBULATORY_CARE_PROVIDER_SITE_OTHER): Payer: Medicare Other | Admitting: Internal Medicine

## 2012-05-16 VITALS — BP 146/91 | HR 79 | Ht 62.0 in | Wt 160.0 lb

## 2012-05-16 DIAGNOSIS — J449 Chronic obstructive pulmonary disease, unspecified: Secondary | ICD-10-CM | POA: Diagnosis not present

## 2012-05-16 DIAGNOSIS — I10 Essential (primary) hypertension: Secondary | ICD-10-CM | POA: Diagnosis not present

## 2012-05-16 DIAGNOSIS — K5900 Constipation, unspecified: Secondary | ICD-10-CM

## 2012-05-16 MED ORDER — PSYLLIUM 95 % PO PACK: PACK | ORAL | Status: DC

## 2012-05-16 MED ORDER — POLYETHYLENE GLYCOL 3350 17 GM/SCOOP PO POWD: 17.0000 g | Freq: Two times a day (BID) | ORAL | Status: DC

## 2012-05-16 NOTE — Assessment & Plan Note (Signed)
Lab Results  Component Value Date   NA 139 09/21/2011   K 3.7 09/21/2011   CL 103 09/21/2011   CO2 27 09/21/2011   BUN 15 09/21/2011   CREATININE 0.72 09/21/2011   CREATININE 0.65 07/28/2010    BP Readings from Last 3 Encounters:  04/23/12 139/93  12/28/11 124/78  11/16/11 124/81    Assessment: Hypertension control:  controlled  Progress toward goals:  at goal Barriers to meeting goals:  no barriers identified  Plan: Hypertension treatment:  continue current medications. Continue Coreg, HCTZ.

## 2012-05-16 NOTE — Patient Instructions (Signed)
Please make followup appointment in 2-3 weeks to followup for constipation. If you get worse or starts having abdominal pain call to make an appointment.  Continue taking Senokot twice daily. - start taking MiraLAX powder twice daily. - Also start taking psyllium once daily and increase up to 3 times daily as needed for constipation. - Try to eat green leafy with stools and salads along with other fibers. - Drink 1-2 gallons of water every day.  Keep taking all medications regularly.

## 2012-05-16 NOTE — Assessment & Plan Note (Signed)
Stable

## 2012-05-16 NOTE — Progress Notes (Signed)
  Subjective:    Patient ID: Alexis Kline, female    DOB: [birth year], 66 y.o.   MRN: [id number]  HPI pressure is a pleasant 66 year old woman with syringomyelia, hypertension, CAD, other problems as per problem list who comes the clinic for followup of her constipation. I saw her on 04/23/2012 for severe constipation which was going on for about a month. She reported having on bowel meds every week. She was prescribed MiraLAX 17 g once daily and Senokot 2 tablets daily. She reports minimal improvement in her constipation with this regimen. Senokot helps to move the bowels enough to have one piece of hard stool. She does not emptying her colon altogether. She reports of some nausea but no vomiting. Also has intermittent diffuse sharp abdominal pain but no epigastric pain.  She has a history of rectal prolapse and also had hemorrhoidectomy twice back in 2008 and 2009. Her last colonoscopy in 2006 was normal. She does have history of constipation predominant IBS.  She denies any fever, chills, vomiting, rectal pain, blood in stool, epigastric pain.   Review of Systems    as per history of present illness. Objective:   Physical Exam  General: NAD HEENT: PERRL, EOMI, no scleral icterus Cardiac: S1, S2, RRR, no rubs, murmurs or gallops Pulm: clear to auscultation bilaterally, moving normal volumes of air Abd: soft, nontender, nondistended, BS present Ext: warm and well perfused, no pedal edema Neuro: alert and oriented X3, cranial nerves II-XII grossly intact Rectal: No anal fissures, no hemorrhoids. No impaction felt.       Assessment & Plan:

## 2012-05-16 NOTE — Assessment & Plan Note (Signed)
About 40 minutes were spent with patient and more than 50% of time with face-to-face. Patient has severe constipation for past 6 weeks now. History of rectal prolapse and hemorrhoidectomy. History of constipation predominant IBS. Rectal exam within normal limits. No significant effect with Senokot 2 tablets daily and MiraLAX once daily. Discussed in detail with Dr. Meredith Pel. - Added psyllium to start once daily and titrate up to 3 times daily. - Continue Senokot and MiraLAX. - Patient says she drinks a lot of water daily and eats green vegetables, salads and other fibers. - Discussed with patient in detail. Decided to give this 2 more weeks and reevaluate her in clinic. - If she does not get better the next visit, will need GI referral for chronic constipation workup and management.

## 2012-05-30 ENCOUNTER — Ambulatory Visit: Payer: Medicare Other | Admitting: Radiation Oncology

## 2012-07-18 ENCOUNTER — Encounter: Payer: Medicare Other | Admitting: Internal Medicine

## 2012-08-20 ENCOUNTER — Other Ambulatory Visit: Payer: Self-pay | Admitting: Internal Medicine

## 2012-09-05 ENCOUNTER — Ambulatory Visit (INDEPENDENT_AMBULATORY_CARE_PROVIDER_SITE_OTHER): Payer: Medicare Other | Admitting: Internal Medicine

## 2012-09-05 ENCOUNTER — Encounter: Payer: Self-pay | Admitting: Internal Medicine

## 2012-09-05 VITALS — BP 153/100 | HR 92 | Temp 97.3°F | Ht 62.0 in | Wt 163.6 lb

## 2012-09-05 DIAGNOSIS — I10 Essential (primary) hypertension: Secondary | ICD-10-CM

## 2012-09-05 DIAGNOSIS — G43909 Migraine, unspecified, not intractable, without status migrainosus: Secondary | ICD-10-CM

## 2012-09-05 NOTE — Patient Instructions (Signed)
Please make a follow up appointment in May. Take all your medications regularly as you do. Take Carvedilol 2 pills twice daily until your blood pressure comes back in range and then go back to 1 pill twice daily.

## 2012-09-05 NOTE — Assessment & Plan Note (Addendum)
Patient probably had a flare up of migraine headache.  She says that her pain is triggered by high blood pressure. Will increase the dose of Coreg to 25 mg twice daily until her blood pressure gets under control and then she would go back to 12.5 mg twice daily. No urgent need for abortive therapy.

## 2012-09-05 NOTE — Assessment & Plan Note (Signed)
BP Readings from Last 3 Encounters:  09/05/12 153/100  05/16/12 146/91  04/23/12 139/93    Lab Results  Component Value Date   NA 139 09/21/2011   K 3.7 09/21/2011   CREATININE 0.72 09/21/2011    Assessment:  Blood pressure control:   mild to moderately elevated  Progress toward BP goal:    worsening  Comments:   Plan:  Medications:  Increase the dose of Coreg to 25 mg twice daily until her blood pressure gets under control. Continue HCTZ 25 mg daily  Educational resources provided: brochure;handout;video  Self management tools provided:    Other plans:

## 2012-09-05 NOTE — Progress Notes (Signed)
  Subjective:    Patient ID: Alexis Kline, female    DOB: [birth year], 67 y.o.   MRN: [id number]  HPI patient is a pleasant 67 year woman with past history of hypertension, hyperlipidemia, CAD, syringomyelia and other medical problems as per problem list who comes the clinic for headache since yesterday. She started having headache associated with neck and bilateral upper back pain. Denies any trauma, fever, chills, nausea, vomiting or chest pain, short of breath, vision changes. Her blood pressure today initially was 170s over 100s- which she says is most likely cause of her headache. She says that once her blood pressure goes more than 130/90 she started having headaches. Although she forgot to check her blood pressure yesterday.   She denies any abdominal pain, diarrhea. Overall she feels okay.    Review of Systems    as per history of present illness Objective:   Physical Exam  General: NAD HEENT: PERRL, EOMI, no scleral icterus Cardiac: S1, S2 RRR, no rubs, murmurs or gallops Pulm: clear to auscultation bilaterally, moving normal volumes of air Abd: soft, nontender, nondistended, BS present Ext: warm and well perfused, no pedal edema Neuro: alert and oriented X3, cranial nerves II-XII grossly intact       Assessment & Plan:

## 2012-10-12 ENCOUNTER — Other Ambulatory Visit: Payer: Self-pay | Admitting: Internal Medicine

## 2012-10-12 DIAGNOSIS — Z1231 Encounter for screening mammogram for malignant neoplasm of breast: Secondary | ICD-10-CM

## 2012-11-05 ENCOUNTER — Ambulatory Visit: Payer: Medicare Other | Admitting: Internal Medicine

## 2012-11-07 ENCOUNTER — Telehealth: Payer: Self-pay | Admitting: *Deleted

## 2012-11-07 ENCOUNTER — Ambulatory Visit (INDEPENDENT_AMBULATORY_CARE_PROVIDER_SITE_OTHER): Payer: Medicare Other | Admitting: Internal Medicine

## 2012-11-07 ENCOUNTER — Encounter: Payer: Self-pay | Admitting: Internal Medicine

## 2012-11-07 VITALS — BP 152/94 | HR 72 | Temp 97.7°F | Ht 62.0 in | Wt 160.9 lb

## 2012-11-07 DIAGNOSIS — K5900 Constipation, unspecified: Secondary | ICD-10-CM

## 2012-11-07 DIAGNOSIS — G950 Syringomyelia and syringobulbia: Secondary | ICD-10-CM | POA: Diagnosis not present

## 2012-11-07 DIAGNOSIS — M5116 Intervertebral disc disorders with radiculopathy, lumbar region: Secondary | ICD-10-CM

## 2012-11-07 DIAGNOSIS — I10 Essential (primary) hypertension: Secondary | ICD-10-CM

## 2012-11-07 DIAGNOSIS — M5126 Other intervertebral disc displacement, lumbar region: Secondary | ICD-10-CM | POA: Diagnosis not present

## 2012-11-07 MED ORDER — HYDROCODONE-ACETAMINOPHEN 5-325 MG PO TABS: 1.0000 | ORAL_TABLET | Freq: Four times a day (QID) | ORAL | Status: DC | PRN

## 2012-11-07 MED ORDER — HYDROCODONE-ACETAMINOPHEN 5-500 MG PO TABS: 1.0000 | ORAL_TABLET | Freq: Four times a day (QID) | ORAL | Status: DC | PRN

## 2012-11-07 NOTE — Telephone Encounter (Signed)
Call from [place] at [facility] stating pt has meds delivered to home and she would like the Vicodin Rx given to her today to be called into [facility]. I called the Rx in and they will collect the hard copy before giving the Rx to pt.

## 2012-11-07 NOTE — Assessment & Plan Note (Signed)
BP Readings from Last 3 Encounters:  11/07/12 152/94  09/05/12 153/100  05/16/12 146/91    Lab Results  Component Value Date   NA 139 09/21/2011   K 3.7 09/21/2011   CREATININE 0.72 09/21/2011    Assessment: Blood pressure control:   mildly elevated Progress toward BP goal:    stable Comments:   Plan: Medications:  continue current medications, Coreg and HCTZ Educational resources provided:   Self management tools provided:   Other plans: No changes in medications today. If she continues to have mild in her blood pressure during next visit, Consider increasing the dose or adding another blood pressure pill.

## 2012-11-07 NOTE — Patient Instructions (Signed)
Please followup appointment in 4-6 months.  Continue take all medications regularly as you do.  Refill of Vicodin 45 tablets was given today.  Call us early for any further questions or concerns if needed.

## 2012-11-07 NOTE — Assessment & Plan Note (Signed)
Has regular bowel movements now. She feels much better with Senokot and MiraLAX. Continue the treatment.

## 2012-11-07 NOTE — Progress Notes (Signed)
  Subjective:    Patient ID: Alexis Kline, female    DOB: [birth year], 67 y.o.   MRN: [id number]  HPI patient is a pleasant 67 year old woman with hypertension, CAD, chronic lumbar back pain, syringomyelia and other problems as per problem list who comes the clinic for regular followup visit.  She has chronic back pain from lumbar DDD and syringomyelia- for which she has had multiple evaluations in past and decided to have nonsurgical approach. Patient is still ambulatory with minimal pain. Denies any worsening of pain since last visit. Denies any new weakness or numbness in lower extremities. She takes one pill of Vicodin once weekly on average. It gave her prescription of 45 pills about 9 months before and she still has one left. She denies any loss of bowel or bladder control. She also takes as needed ibuprofen and Excedrin for back pain.  She denies any fever, chills, nausea, vomiting, abdominal pain, chest pain, short of breath, diarrhea, headache, palpitations.  She is up-to-date on screening mammogram, colonoscopy and flu shot.   Review of Systems    As per history of present illness.  Objective:   Physical Exam  General: NAD HEENT: PERRL, EOMI, no scleral icterus Cardiac: RRR, no rubs, murmurs or gallops Pulm: clear to auscultation bilaterally, moving normal volumes of air Abd: soft, nontender, nondistended, BS present Ext: warm and well perfused, no pedal edema Neuro: alert and oriented X3, cranial nerves II-XII grossly intact       Assessment & Plan:

## 2012-11-07 NOTE — Addendum Note (Signed)
Addended by: Lyn Hollingshead C on: 11/07/2012 05:18 PM   Modules accepted: Orders, Medications

## 2012-11-07 NOTE — Assessment & Plan Note (Signed)
No worsening of symptoms. Continue nonoperative management. - Patient just wants medical therapy and no more physical therapy. - Vicodin prescription refilled. 45 tablets. - Patient still has one tablet left from the prescription given 9 months before. Patient not on pain contract as she does not needed every month. No red flag signs.

## 2012-11-08 NOTE — Progress Notes (Signed)
Case discussed with Dr. Patel soon after the resident saw the patient.  We reviewed the resident's history and exam and pertinent patient test results.  I agree with the assessment, diagnosis and plan of care documented in the resident's note. 

## 2012-11-19 ENCOUNTER — Ambulatory Visit (HOSPITAL_COMMUNITY): Payer: Medicare Other

## 2012-11-22 ENCOUNTER — Other Ambulatory Visit: Payer: Self-pay | Admitting: *Deleted

## 2012-11-28 MED ORDER — CARVEDILOL 12.5 MG PO TABS: 12.5000 mg | ORAL_TABLET | Freq: Two times a day (BID) | ORAL | Status: DC

## 2012-12-03 ENCOUNTER — Ambulatory Visit (HOSPITAL_COMMUNITY): Payer: Medicare Other | Attending: Internal Medicine

## 2012-12-09 ENCOUNTER — Encounter: Payer: Self-pay | Admitting: Internal Medicine

## 2012-12-17 ENCOUNTER — Encounter: Payer: Self-pay | Admitting: Radiation Oncology

## 2012-12-19 ENCOUNTER — Encounter: Payer: Self-pay | Admitting: Internal Medicine

## 2012-12-19 ENCOUNTER — Ambulatory Visit (INDEPENDENT_AMBULATORY_CARE_PROVIDER_SITE_OTHER): Payer: Medicare Other | Admitting: Internal Medicine

## 2012-12-19 VITALS — BP 144/95 | HR 69 | Temp 97.0°F | Ht 62.0 in | Wt 160.0 lb

## 2012-12-19 DIAGNOSIS — M5116 Intervertebral disc disorders with radiculopathy, lumbar region: Secondary | ICD-10-CM

## 2012-12-19 DIAGNOSIS — IMO0001 Reserved for inherently not codable concepts without codable children: Secondary | ICD-10-CM

## 2012-12-19 DIAGNOSIS — M7918 Myalgia, other site: Secondary | ICD-10-CM | POA: Insufficient documentation

## 2012-12-19 DIAGNOSIS — M5126 Other intervertebral disc displacement, lumbar region: Secondary | ICD-10-CM

## 2012-12-19 DIAGNOSIS — I10 Essential (primary) hypertension: Secondary | ICD-10-CM

## 2012-12-19 DIAGNOSIS — G950 Syringomyelia and syringobulbia: Secondary | ICD-10-CM

## 2012-12-19 LAB — CBC
HCT: 38.7 % (ref 36.0–46.0)
Hemoglobin: 12.3 g/dL (ref 12.0–15.0)
MCH: 23.4 pg — ABNORMAL LOW (ref 26.0–34.0)
MCHC: 31.8 g/dL (ref 30.0–36.0)
MCV: 73.7 fL — ABNORMAL LOW (ref 78.0–100.0)
Platelets: 274 10*3/uL (ref 150–400)
RBC: 5.25 MIL/uL — ABNORMAL HIGH (ref 3.87–5.11)
RDW: 16.8 % — ABNORMAL HIGH (ref 11.5–15.5)
WBC: 8.1 10*3/uL (ref 4.0–10.5)

## 2012-12-19 LAB — LIPID PANEL
Cholesterol: 202 mg/dL — ABNORMAL HIGH (ref 0–200)
HDL: 44 mg/dL (ref >39)
LDL Cholesterol: 111 mg/dL — ABNORMAL HIGH (ref 0–99)
Total CHOL/HDL Ratio: 4.6 Ratio
Triglycerides: 234 mg/dL — ABNORMAL HIGH (ref <150)
VLDL: 47 mg/dL — ABNORMAL HIGH (ref 0–40)

## 2012-12-19 MED ORDER — LIDOCAINE 5 % EX PTCH: 1.0000 | MEDICATED_PATCH | CUTANEOUS | Status: DC

## 2012-12-19 MED ORDER — NAPROXEN 500 MG PO TABS: 500.0000 mg | ORAL_TABLET | Freq: Two times a day (BID) | ORAL | Status: AC

## 2012-12-19 MED ORDER — TIZANIDINE HCL 2 MG PO TABS: 2.0000 mg | ORAL_TABLET | Freq: Three times a day (TID) | ORAL | Status: DC | PRN

## 2012-12-19 NOTE — Assessment & Plan Note (Addendum)
Assessment:  Alexis Kline is a 67 yo F who presents with a 2 week history of back pain. The pain is likely secondary to myofascial pain.   Plan:  We plan to discontinue hydrocodone/APAP secondary to patient complaining of dizziness and feeling intoxicated. We started tizanidine and lidocaine patches prn as well as Naprosyn 500 mg PO BID for the next two weeks, then as needed.  Today, we ordered BMP as we are starting an NSAID in a patient with unknown CrCl. We discussed referral to PT if the patients symptoms do not improve by 6 weeks after the initial injury. Furthermore, we recommended the patient to follow-up with PCP as needed if symptoms do not improve in 6 weeks or continue to worsen.

## 2012-12-19 NOTE — Patient Instructions (Addendum)
Take tizanidine as needed for myofascial back pain.  Use lidocaine patches daily as needed for pain.  Take naprosyn 500 mg twice daily for next 2 weeks and as needed after two weeks.  Discontinue hydrocodone/APAP.  Follow-up with PCP as needed if symptoms do not improve in 6 weeks or continue to worsen.   Back Pain, Adult Low back pain is very common. About 1 in 5 people have back pain.The cause of low back pain is rarely dangerous. The pain often gets better over time.About half of people with a sudden onset of back pain feel better in just 2 weeks. About 8 in 10 people feel better by 6 weeks.  CAUSES Some common causes of back pain include:  Strain of the muscles or ligaments supporting the spine.  Wear and tear (degeneration) of the spinal discs.  Arthritis.  Direct injury to the back. DIAGNOSIS Most of the time, the direct cause of low back pain is not known.However, back pain can be treated effectively even when the exact cause of the pain is unknown.Answering your caregiver's questions about your overall health and symptoms is one of the most accurate ways to make sure the cause of your pain is not dangerous. If your caregiver needs more information, he or she may order lab work or imaging tests (X-rays or MRIs).However, even if imaging tests show changes in your back, this usually does not require surgery. HOME CARE INSTRUCTIONS For many people, back pain returns.Since low back pain is rarely dangerous, it is often a condition that people can learn to [facility] their own.   Remain active. It is stressful on the back to sit or stand in one place. Do not sit, drive, or stand in one place for more than 30 minutes at a time. Take short walks on level surfaces as soon as pain allows.Try to increase the length of time you walk each day.  Do not stay in bed.Resting more than 1 or 2 days can delay your recovery.  Do not avoid exercise or work.Your body is made to move.It is  not dangerous to be active, even though your back may hurt.Your back will likely heal faster if you return to being active before your pain is gone.  Pay attention to your body when you bend and lift. Many people have less discomfortwhen lifting if they bend their knees, keep the load close to their bodies,and avoid twisting. Often, the most comfortable positions are those that put less stress on your recovering back.  Find a comfortable position to sleep. Use a firm mattress and lie on your side with your knees slightly bent. If you lie on your back, put a pillow under your knees.  Only take over-the-counter or prescription medicines as directed by your caregiver. Over-the-counter medicines to reduce pain and inflammation are often the most helpful.Your caregiver may prescribe muscle relaxant drugs.These medicines help dull your pain so you can more quickly return to your normal activities and healthy exercise.  Put ice on the injured area.  Put ice in a plastic bag.  Place a towel between your skin and the bag.  Leave the ice on for 15-20 minutes, 3-4 times a day for the first 2 to 3 days. After that, ice and heat may be alternated to reduce pain and spasms.  Ask your caregiver about trying back exercises and gentle massage. This may be of some benefit.  Avoid feeling anxious or stressed.Stress increases muscle tension and can worsen back pain.It is important to  recognize when you are anxious or stressed and learn ways to manage it.Exercise is a great option. SEEK MEDICAL CARE IF:  You have pain that is not relieved with rest or medicine.  You have pain that does not improve in 1 week.  You have new symptoms.  You are generally not feeling well. SEEK IMMEDIATE MEDICAL CARE IF:   You have pain that radiates from your back into your legs.  You develop new bowel or bladder control problems.  You have unusual weakness or numbness in your arms or legs.  You develop nausea or  vomiting.  You develop abdominal pain.  You feel faint. Document Released: 06/06/2005 Document Revised: 12/06/2011 Document Reviewed: 10/25/2010 [facility] Patient Information 2014 [place], [place].

## 2012-12-19 NOTE — Progress Notes (Signed)
  Subjective:    Patient ID: Alexis Kline, female    DOB: 1945/09/03, 67 y.o.   MRN: [id number]  HPI   Patient is a pleasant 67 year old woman with hypertension, CAD, chronic lumbar back pain, and syringomyelia who comes the clinic for an acute visit for back pain.  Two weeks ago the patient was in her usual state of health when she bent over to pick up her shoe. Upon standing, she heard a pop in her mid back and felt significant pain. The pain is localized to the paraspinal muscles, radiates up and down her spine. She characterizes the pain as dull pressure. The pain is a 10/10 and is unremitting. Walking, standing, bending, lifting, makes the pain worse. Sleeping makes the pain better. The patient tried tylenol, NSAIDs, heating pads, and stretching with minimal relief.   Review of Systems  Review of Systems  Constitutional: Negative for fever, chills, weight loss, malaise/fatigue and diaphoresis.  HENT: Positive for neck pain. Negative for hearing loss.   Eyes: Negative for blurred vision, pain and discharge.  Respiratory: Positive for cough. Negative for sputum production, shortness of breath and wheezing.   Cardiovascular: Negative for chest pain, palpitations, orthopnea and leg swelling.  Gastrointestinal: Positive for diarrhea and constipation.  Genitourinary: Negative.   Musculoskeletal: Positive for myalgias and back pain.  Skin: Negative for itching and rash.  Neurological: Positive for dizziness. Negative for tingling, tremors, sensory change, speech change, focal weakness, seizures, weakness and headaches.  Psychiatric/Behavioral: Negative.     Objective:   Filed Vitals:   12/19/12 0949  BP: 144/95  Pulse: 69  Temp: 97 F (36.1 C)     Physical Exam  Constitutional: She appears well-developed and well-nourished. No distress.  HENT:  Head: Normocephalic and atraumatic.  Nose: Nose normal.  Eyes: Conjunctivae and EOM are normal. Pupils are equal, round, and  reactive to light.  Neck: Normal range of motion.  Cardiovascular: Normal rate, regular rhythm and normal heart sounds.  Exam reveals no gallop and no friction rub.   No murmur heard. Pulmonary/Chest: Effort normal and breath sounds normal. No respiratory distress. She has no wheezes. She has no rales. She exhibits no tenderness.  Abdominal: Soft. Bowel sounds are normal. She exhibits no distension. There is no tenderness.  Musculoskeletal:       Thoracic back: She exhibits decreased range of motion, tenderness, swelling, deformity and spasm. She exhibits no bony tenderness and no edema.       Lumbar back: She exhibits decreased range of motion and tenderness.       Back:  Skin: She is not diaphoretic.        Assessment & Plan:

## 2012-12-20 LAB — BASIC METABOLIC PANEL WITH GFR
BUN: 13 mg/dL (ref 6–23)
CO2: 28 mEq/L (ref 19–32)
Calcium: 9.7 mg/dL (ref 8.4–10.5)
Chloride: 102 mEq/L (ref 96–112)
Creat: 0.82 mg/dL (ref 0.50–1.10)
GFR, Est African American: 86 mL/min
GFR, Est Non African American: 75 mL/min
Glucose, Bld: 86 mg/dL (ref 70–99)
Potassium: 4.2 mEq/L (ref 3.5–5.3)
Sodium: 140 mEq/L (ref 135–145)

## 2012-12-20 NOTE — Progress Notes (Signed)
I saw and evaluated the patient.  I personally confirmed the key portions of the history and exam documented by Dr. Komanski and I reviewed pertinent patient test results.  The assessment, diagnosis, and plan were formulated together and I agree with the documentation in the resident's note.  

## 2012-12-22 ENCOUNTER — Encounter: Payer: Self-pay | Admitting: Radiation Oncology

## 2012-12-31 ENCOUNTER — Other Ambulatory Visit: Payer: Self-pay | Admitting: Internal Medicine

## 2012-12-31 DIAGNOSIS — M7918 Myalgia, other site: Secondary | ICD-10-CM

## 2012-12-31 MED ORDER — LIDOCAINE 5 % EX PTCH: 1.0000 | MEDICATED_PATCH | CUTANEOUS | Status: DC

## 2013-01-07 ENCOUNTER — Ambulatory Visit (HOSPITAL_COMMUNITY): Payer: Medicare Other

## 2013-01-22 ENCOUNTER — Other Ambulatory Visit: Payer: Self-pay | Admitting: Internal Medicine

## 2013-01-22 ENCOUNTER — Other Ambulatory Visit: Payer: Self-pay | Admitting: *Deleted

## 2013-01-22 DIAGNOSIS — I10 Essential (primary) hypertension: Secondary | ICD-10-CM

## 2013-01-22 MED ORDER — HYDROCHLOROTHIAZIDE 25 MG PO TABS: 25.0000 mg | ORAL_TABLET | Freq: Every day | ORAL | Status: DC

## 2013-01-24 NOTE — Telephone Encounter (Signed)
Empty request 

## 2013-01-28 ENCOUNTER — Ambulatory Visit (HOSPITAL_COMMUNITY)
Admission: RE | Admit: 2013-01-28 | Discharge: 2013-01-28 | Disposition: A | Discharge status: Home / Self Care | Payer: Medicare Other | Source: Ambulatory Visit | Attending: Internal Medicine | Admitting: Internal Medicine

## 2013-01-28 DIAGNOSIS — Z1231 Encounter for screening mammogram for malignant neoplasm of breast: Secondary | ICD-10-CM | POA: Diagnosis not present

## 2013-02-20 ENCOUNTER — Encounter: Payer: Self-pay | Admitting: Internal Medicine

## 2013-02-26 ENCOUNTER — Other Ambulatory Visit: Payer: Self-pay | Admitting: *Deleted

## 2013-02-27 MED ORDER — OMEPRAZOLE 40 MG PO CPDR: DELAYED_RELEASE_CAPSULE | ORAL | Status: DC

## 2013-02-28 ENCOUNTER — Other Ambulatory Visit: Payer: Self-pay | Admitting: *Deleted

## 2013-02-28 DIAGNOSIS — M7918 Myalgia, other site: Secondary | ICD-10-CM

## 2013-03-01 MED ORDER — LIDOCAINE 5 % EX PTCH: 1.0000 | MEDICATED_PATCH | CUTANEOUS | Status: AC

## 2013-03-01 NOTE — Telephone Encounter (Signed)
Message sent to front desk to sched an appt per Dr Aundria Rud' request.

## 2013-03-05 ENCOUNTER — Ambulatory Visit: Payer: Medicare Other

## 2013-03-15 ENCOUNTER — Ambulatory Visit (INDEPENDENT_AMBULATORY_CARE_PROVIDER_SITE_OTHER): Payer: Medicare Other | Admitting: Internal Medicine

## 2013-03-15 ENCOUNTER — Ambulatory Visit: Payer: Medicare Other

## 2013-03-15 ENCOUNTER — Encounter: Payer: Self-pay | Admitting: Internal Medicine

## 2013-03-15 VITALS — BP 122/78 | HR 63 | Temp 98.4°F | Ht 62.0 in | Wt 161.2 lb

## 2013-03-15 DIAGNOSIS — F17200 Nicotine dependence, unspecified, uncomplicated: Secondary | ICD-10-CM | POA: Diagnosis not present

## 2013-03-15 DIAGNOSIS — IMO0001 Reserved for inherently not codable concepts without codable children: Secondary | ICD-10-CM

## 2013-03-15 DIAGNOSIS — I10 Essential (primary) hypertension: Secondary | ICD-10-CM | POA: Diagnosis not present

## 2013-03-15 DIAGNOSIS — M7918 Myalgia, other site: Secondary | ICD-10-CM

## 2013-03-15 DIAGNOSIS — J449 Chronic obstructive pulmonary disease, unspecified: Secondary | ICD-10-CM | POA: Diagnosis not present

## 2013-03-15 DIAGNOSIS — Z0000 Encounter for general adult medical examination without abnormal findings: Secondary | ICD-10-CM | POA: Diagnosis not present

## 2013-03-15 DIAGNOSIS — I2510 Atherosclerotic heart disease of native coronary artery without angina pectoris: Secondary | ICD-10-CM

## 2013-03-15 NOTE — Progress Notes (Signed)
Patient ID: Alexis Kline, female   DOB: Jan 27, 1946, 67 y.o.   MRN: [id number] Subjective:   Patient ID: Alexis Kline female   DOB: July 14, 1945 67 y.o.   MRN: [id number]  CC:   Follow up visit.     HPI:  [name] is a 67 y.o. lady with past medical history as outlined below, who presents for a followup visit today  1. HTN: Patient is currently taking Coreg 12.5 mg twice a day and hydrochlorothiazide 25 mg daily. Her blood pressure is 122/78 today. Her potassium and cre were normal on 12/19/12.  2. COPD: Patient has albuterol inhaler at home. She has not used for long time. She is currently doing well. She does not have cough, shortness of breath, wheezing.   3. CAD: Patient is taking aspirin 81 mg daily and Coreg 12.5 mg twice a day. She does not have chest pain, shortness of breath, palpitation or leg edema. Her previous 2-D echo on 02/24/2006 showed normal systolic function function, without wall motion abnormalities.  4. Back pain: Patient was seen in clinic on 12/24/12. She was diagnosed as myofascial pain. She is currently taking Lidoderm patch, naproxen and Tizanidine. Her back pain improved significantly. She is a happy about these results.  ROS:  Denies fever, chills, fatigue, headaches,  cough, chest pain, SOB,  abdominal pain,diarrhea, constipation, dysuria, urgency, frequency, hematuria, joint pain or leg swelling.   Past Medical History  Diagnosis Date  . Hypertension   . HLD (hyperlipidemia)   . GERD (gastroesophageal reflux disease)   . COPD (chronic obstructive pulmonary disease)   . Hemorrhoids   . CAD (coronary artery disease) 2007    nonobstructive, 30% LAD 02/2006  . Fibroadenoma 2008    right breast - based on [place] 09/2006  . HYSTERECTOMY, HX OF 07/10/2006    Qualifier: Diagnosis of  By: Danae Chen    . APPENDECTOMY, HX OF 07/10/2006    Qualifier: Diagnosis of  By: Danae Chen     Current Outpatient Prescriptions  Medication Sig  Dispense Refill  . albuterol (VENTOLIN HFA) 108 (90 BASE) MCG/ACT inhaler Inhale 1-2 puffs into the lungs every 4 (four) hours as needed. For shortness of breath, cough, or wheezing       . aspirin 81 MG EC tablet Take 1 tablet (81 mg total) by mouth daily.  30 tablet  5  . B Complex-Biotin-FA (SUPER B-COMPLEX PO) Take 1 tablet by mouth daily.        . carvedilol (COREG) 12.5 MG tablet Take 1 tablet (12.5 mg total) by mouth 2 (two) times daily with a meal.  180 tablet  3  . hydrochlorothiazide (HYDRODIURIL) 25 MG tablet Take 1 tablet (25 mg total) by mouth daily.  30 tablet  11  . lidocaine (LIDODERM) 5 % Place 1 patch onto the skin daily. Remove & Discard patch within 12 hours or as directed by MD  30 patch  0  . naproxen (NAPROSYN) 500 MG tablet Take 1 tablet (500 mg total) by mouth 2 (two) times daily with a meal.  60 tablet  2  . nitroGLYCERIN (NITROSTAT) 0.4 MG SL tablet Place 1 tablet (0.4 mg total) under the tongue every 5 (five) minutes. up to 3 times as needed for chest pain   25 tablet  1  . omeprazole (PRILOSEC) 40 MG capsule TAKE ONE (1) CAPSULE EACH DAY  30 capsule  3  . pravastatin (PRAVACHOL) 40 MG tablet TAKE ONE TABLET AT BEDTIME  30 tablet  11  . psyllium (HYDROCIL/METAMUCIL) 95 % PACK Start with one packet once daily dissolved in water or juice. Increased up to one pack 3 times daily as needed for constipation.  500 each  1  . senna-docusate (SENOKOT-S) 8.6-50 MG per tablet Take 2 tablets by mouth daily.  60 tablet  2  . tiZANidine (ZANAFLEX) 2 MG tablet Take 1 tablet (2 mg total) by mouth every 8 (eight) hours as needed.  30 tablet  0  . polyethylene glycol powder (GLYCOLAX/MIRALAX) powder Take 17 g by mouth 2 (two) times daily.  850 g  2   No current facility-administered medications for this visit.   Family History  Problem Relation Age of Onset  . Hyperlipidemia Mother   . Hypertension Mother   . Hyperlipidemia Father   . Hypertension Father   . Heart disease Sister    . Stroke Neg Hx   . Diabetes Neg Hx    History   Social History  . Marital Status: Widowed    Spouse Name: N/A    Number of Children: N/A  . Years of Education: N/A   Social History Main Topics  . Smoking status: Current Some Day Smoker -- 0.40 packs/day    Types: Cigarettes  . Smokeless tobacco: None     Comment: sometimes 3 cigs/day  . Alcohol Use: No  . Drug Use: No  . Sexual Activity: Not Currently   Other Topics Concern  . None   Social History Narrative  . None    Review of Systems: Full 14-point review of systems otherwise negative. See HPI.   Objective:  Physical Exam: Filed Vitals:   03/15/13 1123  BP: 122/78  Pulse: 63  Temp: 98.4 F (36.9 C)  TempSrc: Oral  Height: 5\' 2"  (1.575 m)  Weight: 161 lb 3.2 oz (73.12 kg)  SpO2: 98%   Constitutional: Vital signs reviewed.  Patient is a well-developed and well-nourished, in no acute distress and cooperative with exam.   HEENT:  Head: Normocephalic and atraumatic Mouth: no erythema or exudates, MMM Eyes: PERRL, EOMI, conjunctivae normal, No scleral icterus.  Neck: Supple, Trachea midline normal ROM, No JVD  Cardiovascular: RRR, S1 normal, S2 normal, no MRG, pulses symmetric and intact bilaterally Pulmonary/Chest: CTAB, no wheezes, rales, or rhonchi Abdominal: Soft. Non-tender, non-distended, bowel sounds are normal, no masses, organomegaly, or guarding present.  Musculoskeletal: No joint deformities, erythema, or stiffness, ROM full and non-tender Extremities: No leg edema Hematology: no cervical, inginal, or axillary adenopathy.  Neurological: A&O x3, Strength is normal and symmetric bilaterally, cranial nerve II-XII are grossly intact, no focal motor deficit, sensory intact to light touch bilaterally. Skin: Warm, dry and intact. No rash, cyanosis, or clubbing.  Psychiatric: Normal mood and affect. No suicidal or homicidal ideation.  Assessment & Plan:

## 2013-03-15 NOTE — Assessment & Plan Note (Addendum)
-  Patient is not ready to do flu shot today. She reports that she had a mild fever recently, which just resolved. Though this is not contraindicated, she would like to postpone flu shot.  -she refused Zostavax.

## 2013-03-15 NOTE — Assessment & Plan Note (Signed)
  Assessment: Progress toward smoking cessation:  smoking less Barriers to progress toward smoking cessation:    Comments:   Plan: Instruction/counseling given:  I counseled patient on the dangers of tobacco use, advised patient to stop smoking, and reviewed strategies to maximize success. Educational resources provided:  [employer]) brochure Self management tools provided:    Medications to assist with smoking cessation:   Patient agreed to the following self-care plans for smoking cessation:    Other plans: Patient smokes less, currently she smokes about 1 or 2 cigarettes occasionally. She is currently using E. cigarette. Patient is encouraged to make more effort to stop smoking. She agrees to do so.

## 2013-03-15 NOTE — Assessment & Plan Note (Signed)
It is stable. Patient does not have cough, shortness of breath, chest pain, wheezing. Will continue when necessary albuterol inhaler.

## 2013-03-15 NOTE — Assessment & Plan Note (Signed)
BP Readings from Last 3 Encounters:  03/15/13 122/78  12/19/12 144/95  11/07/12 152/94    Lab Results  Component Value Date   NA 140 12/19/2012   K 4.2 12/19/2012   CREATININE 0.82 12/19/2012    Assessment: Blood pressure control: controlled Progress toward BP goal:  at goal Comments:   Plan: Medications:  continue current medications Educational resources provided: brochure Self management tools provided:   Other plans: It is well controlled. Will continue Coreg 12.5 mg twice a day and hydrochlorothiazide 25 mg daily.

## 2013-03-15 NOTE — Assessment & Plan Note (Signed)
Patient's back is well controlled. Will continue Lidoderm, naproxen and tizanidine.

## 2013-03-15 NOTE — Patient Instructions (Signed)
1. You have done great job in taking all your medications. I appreciate it very much. Please continue doing that. 2. Please take all medications as prescribed.  3. If you have worsening of your symptoms or new symptoms arise, please call the clinic ([phone]), or go to the ER immediately if symptoms are severe.     

## 2013-03-15 NOTE — Assessment & Plan Note (Signed)
It is stable. Patient does not have chest pain, shortness of breath, palpitation. Will continue aspirin 81 mg daily and Coreg 12.5 mg twice a day.

## 2013-03-20 NOTE — Progress Notes (Signed)
Case discussed with Dr. Niu at the time of the visit.  We reviewed the resident's history and exam and pertinent patient test results.  I agree with the assessment, diagnosis, and plan of care documented in the resident's note.    

## 2013-04-08 ENCOUNTER — Encounter: Payer: Self-pay | Admitting: Internal Medicine

## 2013-04-08 ENCOUNTER — Ambulatory Visit (INDEPENDENT_AMBULATORY_CARE_PROVIDER_SITE_OTHER): Payer: Medicare Other | Admitting: Internal Medicine

## 2013-04-08 VITALS — BP 127/88 | HR 69 | Temp 97.4°F | Ht 62.0 in | Wt 160.0 lb

## 2013-04-08 DIAGNOSIS — I10 Essential (primary) hypertension: Secondary | ICD-10-CM | POA: Diagnosis not present

## 2013-04-08 DIAGNOSIS — K589 Irritable bowel syndrome without diarrhea: Secondary | ICD-10-CM

## 2013-04-08 DIAGNOSIS — Z0000 Encounter for general adult medical examination without abnormal findings: Secondary | ICD-10-CM

## 2013-04-08 DIAGNOSIS — F17200 Nicotine dependence, unspecified, uncomplicated: Secondary | ICD-10-CM

## 2013-04-08 DIAGNOSIS — Z23 Encounter for immunization: Secondary | ICD-10-CM | POA: Diagnosis not present

## 2013-04-08 NOTE — Assessment & Plan Note (Signed)
BP Readings from Last 3 Encounters:  04/08/13 127/88  03/15/13 122/78  12/19/12 144/95    Lab Results  Component Value Date   NA 140 12/19/2012   K 4.2 12/19/2012   CREATININE 0.82 12/19/2012    Assessment: Blood pressure control: controlled Progress toward BP goal:  at goal   Plan: Medications:  continue current medications

## 2013-04-08 NOTE — Assessment & Plan Note (Signed)
  Assessment: Progress toward smoking cessation:  stopped smoking (smoking e-cigs) Barriers to progress toward smoking cessation:    Comments: off cigarettes x1 mo  Plan: Instruction/counseling given:  I commended patient for quitting and reviewed strategies for preventing relapses. Educational resources provided:    Self management tools provided:    Medications to assist with smoking cessation:  None

## 2013-04-08 NOTE — Progress Notes (Signed)
Patient ID: Alexis Kline, female   DOB: 1945-11-08, 67 y.o.   MRN: [id number]  Subjective:   Patient ID: Alexis Kline female   DOB: [birth year] 67 y.o.   MRN: [id number]  HPI: Alexis Kline is a 67 y.o. F with PMH HTN, HLD, CAD, tobacco abuse, COPD, and IBS presents to [facility] with loose stools.   Per pt, loose stools intermittently that occur one to 2 times a week over the past 2 months. She does have a history of irritable bowel syndrome with her primary symptom being constipation. She is on MiraLax and Metamucil PRN as well as Senokot-S 2 tablets daily. She states it lasted just at the [facility] and he Metamucil was last week, but she does take the Senokot-S daily with 2 tablets in the morning. She endorses urgency with these loose stools, but has been able to make it to the bathroom without any incontinence. She denies any blood in her stool, but does rarely see mucus. She does endorse intermittent "fevers", with a temperature ranging between 99-100.1.  She states that she has quit smoking cigarettes for the past one month. She is now smoking electronic cigarettes.   Past Medical History  Diagnosis Date  . Hypertension   . HLD (hyperlipidemia)   . GERD (gastroesophageal reflux disease)   . COPD (chronic obstructive pulmonary disease)   . Hemorrhoids   . CAD (coronary artery disease) 2007    nonobstructive, 30% LAD 02/2006  . Fibroadenoma 2008    right breast - based on [place] 09/2006  . HYSTERECTOMY, HX OF 07/10/2006    Qualifier: Diagnosis of  By: Danae Chen    . APPENDECTOMY, HX OF 07/10/2006    Qualifier: Diagnosis of  By: Danae Chen     Current Outpatient Prescriptions  Medication Sig Dispense Refill  . aspirin 81 MG EC tablet Take 1 tablet (81 mg total) by mouth daily.  30 tablet  5  . B Complex-Biotin-FA (SUPER B-COMPLEX PO) Take 1 tablet by mouth daily.        . carvedilol (COREG) 12.5 MG tablet Take 1 tablet (12.5 mg total) by mouth 2 (two)  times daily with a meal.  180 tablet  3  . hydrochlorothiazide (HYDRODIURIL) 25 MG tablet Take 1 tablet (25 mg total) by mouth daily.  30 tablet  11  . HYDROcodone-acetaminophen (NORCO/VICODIN) 5-325 MG per tablet Take 1 tablet by mouth every 6 (six) hours as needed for pain.      Marland Kitchen lidocaine (LIDODERM) 5 % Place 1 patch onto the skin daily. Remove & Discard patch within 12 hours or as directed by MD  30 patch  0  . naproxen (NAPROSYN) 500 MG tablet Take 1 tablet (500 mg total) by mouth 2 (two) times daily with a meal.  60 tablet  2  . omeprazole (PRILOSEC) 40 MG capsule TAKE ONE (1) CAPSULE EACH DAY  30 capsule  3  . polyethylene glycol powder (GLYCOLAX/MIRALAX) powder Take 17 g by mouth 2 (two) times daily.  850 g  2  . pravastatin (PRAVACHOL) 40 MG tablet TAKE ONE TABLET AT BEDTIME  30 tablet  11  . psyllium (HYDROCIL/METAMUCIL) 95 % PACK Start with one packet once daily dissolved in water or juice. Increased up to one pack 3 times daily as needed for constipation.  500 each  1  . senna-docusate (SENOKOT-S) 8.6-50 MG per tablet Take 2 tablets by mouth daily.  60 tablet  2  . tiZANidine (ZANAFLEX) 2 MG  tablet Take 1 tablet (2 mg total) by mouth every 8 (eight) hours as needed.  30 tablet  0  . albuterol (VENTOLIN HFA) 108 (90 BASE) MCG/ACT inhaler Inhale 1-2 puffs into the lungs every 4 (four) hours as needed. For shortness of breath, cough, or wheezing       . nitroGLYCERIN (NITROSTAT) 0.4 MG SL tablet Place 1 tablet (0.4 mg total) under the tongue every 5 (five) minutes. up to 3 times as needed for chest pain   25 tablet  1   No current facility-administered medications for this visit.   Family History  Problem Relation Age of Onset  . Hyperlipidemia Mother   . Hypertension Mother   . Hyperlipidemia Father   . Hypertension Father   . Heart disease Sister   . Stroke Neg Hx   . Diabetes Neg Hx    History   Social History  . Marital Status: Widowed    Spouse Name: N/A    Number of  Children: N/A  . Years of Education: N/A   Social History Main Topics  . Smoking status: Current Some Day Smoker -- 0.40 packs/day    Types: Cigarettes  . Smokeless tobacco: None     Comment: sometimes 3 cigs/day  . Alcohol Use: No  . Drug Use: No  . Sexual Activity: Not Currently   Other Topics Concern  . None   Social History Narrative  . None   Review of Systems: A 10 point ROS was performed; pertinent positives and negatives were noted in the HPI   Objective:  Physical Exam: Filed Vitals:   04/08/13 1337 04/08/13 1340  BP: 127/88 127/88  Pulse: 69 69  Temp: 97.4 F (36.3 C) 97.4 F (36.3 C)  TempSrc: Oral Oral  Height: 5\' 2"  (1.575 m) 5\' 2"  (1.575 m)  Weight: 160 lb (72.576 kg) 160 lb (72.576 kg)  SpO2: 98% 98%   Constitutional: Vital signs reviewed.  Patient is a well-developed and well-nourished female in no acute distress and cooperative with exam. Alert.  Head: Normocephalic and atraumatic Eyes: PERRL, EOMI, conjunctivae normal, No scleral icterus.  Neck: Supple, Trachea midline normal ROM, No JVD, mass, thyromegaly Cardiovascular: RRR, S1 normal, S2 normal, no MRG, pulses symmetric and intact bilaterally Pulmonary/Chest: Normal respiratory effort, CTAB, no wheezes, rales, or rhonchi Abdominal: Soft. Non-tender, non-distended, bowel sounds are normal, no masses, organomegaly, or guarding present.  Musculoskeletal: No joint deformities, erythema, or stiffness, ROM full and no nontender Hematology: Shotty cervical adenopathy.  Neurological: A&O x3, nonfocal Skin: Warm, dry and intact. No rash, cyanosis, or clubbing.  Psychiatric: Normal mood and affect. speech and behavior is normal. Judgment and thought content normal. Cognition and memory are normal.   Assessment & Plan:  Please refer to Problem List based Assessment and Plan

## 2013-04-08 NOTE — Assessment & Plan Note (Signed)
This loose stools seem to be related to her IBS and the fact that she is on multiple medications for constipation. While she hasn't taken the Metamucil or the MiraLax this last week, she still taking the Senokot-S 2 tablets daily. This is likely because of her urgency and loose stools. - Decrease Senokot-S to 1 tablet in the morning, once her stools become more formed, she can begin the Senokot-S one tablet in the morning and one tablet later in the day, which should reduce the urgency she is experiencing in addition to the loose stools. - She is to only take the Metamucil and MiraLax once her stools are more formed. - Followup in 3 months

## 2013-04-08 NOTE — Patient Instructions (Signed)
**Decrease the Senna-Colace to 1 tablet daily instead of 2 tablets daily. Once your stools become more formed, and you feel that you need the additional pill, begin taking 1 tablet in the morning and one later in the day, which should improve the urgent feelings you are having, and should reduce your need to run to bathroom.  **Only take the Metamucil and Miralax when your stools become more formed. Avoid these right now while your stools are loose.  **If your stools remain loose after 1 week, please call the clinic to be seen again.     Irritable Bowel Syndrome Irritable Bowel Syndrome (IBS) is caused by a disturbance of normal bowel function. Other terms used are spastic colon, mucous colitis, and irritable colon. It does not require surgery, nor does it lead to cancer. There is no cure for IBS. But with proper diet, stress reduction, and medication, you will find that your problems (symptoms) will gradually disappear or improve. IBS is a common digestive disorder. It usually appears in late adolescence or early adulthood. Women develop it twice as often as men. CAUSES  After food has been digested and absorbed in the small intestine, waste material is moved into the colon (large intestine). In the colon, water and salts are absorbed from the undigested products coming from the small intestine. The remaining residue, or fecal material, is held for elimination. Under normal circumstances, gentle, rhythmic contractions on the bowel walls push the fecal material along the colon towards the rectum. In IBS, however, these contractions are irregular and poorly coordinated. The fecal material is either retained too long, resulting in constipation, or expelled too soon, producing diarrhea. SYMPTOMS  The most common symptom of IBS is pain. It is typically in the lower left side of the belly (abdomen). But it may occur anywhere in the abdomen. It can be felt as heartburn, backache, or even as a dull pain in the  arms or shoulders. The pain comes from excessive bowel-muscle spasms and from the buildup of gas and fecal material in the colon. This pain:  Can range from sharp belly (abdominal) cramps to a dull, continuous ache.  Usually worsens soon after eating.  Is typically relieved by having a bowel movement or passing gas. Abdominal pain is usually accompanied by constipation. But it may also produce diarrhea. The diarrhea typically occurs right after a meal or upon arising in the morning. The stools are typically soft and watery. They are often flecked with secretions (mucus). Other symptoms of IBS include:  Bloating.  Loss of appetite.  Heartburn.  Feeling sick to your stomach (nausea).  Belching  Vomiting  Gas. IBS may also cause a number of symptoms that are unrelated to the digestive system:  Fatigue.  Headaches.  Anxiety  Shortness of breath  Difficulty in concentrating.  Dizziness. These symptoms tend to come and go. DIAGNOSIS  The symptoms of IBS closely mimic the symptoms of other, more serious digestive disorders. So your caregiver may wish to perform a variety of additional tests to exclude these disorders. He/she wants to be certain of learning what is wrong (diagnosis). The nature and purpose of each test will be explained to you. TREATMENT A number of medications are available to help correct bowel function and/or relieve bowel spasms and abdominal pain. Among the drugs available are:  Mild, non-irritating laxatives for severe constipation and to help restore normal bowel habits.  Specific anti-diarrheal medications to treat severe or prolonged diarrhea.  Anti-spasmodic agents to relieve intestinal cramps.  Your caregiver may also decide to treat you with a mild tranquilizer or sedative during unusually stressful periods in your life. The important thing to remember is that if any drug is prescribed for you, make sure that you take it exactly as directed. Make  sure that your caregiver knows how well it worked for you. HOME CARE INSTRUCTIONS   Avoid foods that are high in fat or oils. Some examples WUJ:[id number] cream, butter, frankfurters, sausage, and other fatty meats.  Avoid foods that have a laxative effect, such as fruit, fruit juice, and dairy products.  Cut out carbonated drinks, chewing gum, and "gassy" foods, such as beans and cabbage. This may help relieve bloating and belching.  Bran taken with plenty of liquids may help relieve constipation.  Keep track of what foods seem to trigger your symptoms.  Avoid emotionally charged situations or circumstances that produce anxiety.  Start or continue exercising.  Get plenty of rest and sleep. MAKE SURE YOU:   Understand these instructions.  Will watch your condition.  Will get help right away if you are not doing well or get worse. Document Released: 06/06/2005 Document Revised: 08/29/2011 Document Reviewed: 01/25/2008 [facility] Patient Information 2014 [place], [place].   Diet and Irritable Bowel Syndrome  No cure has been found for irritable bowel syndrome (IBS). Many options are available to treat the symptoms. Your caregiver will give you the best treatments available for your symptoms. He or she will also encourage you to manage stress and to make changes to your diet. You need to work with your caregiver and Registered Dietician to find the best combination of medicine, diet, counseling, and support to control your symptoms. The following are some diet suggestions. FOODS THAT MAKE IBS WORSE  Fatty foods, such as [place] fries.  Milk products, such as cheese or ice cream.  Chocolate.  Alcohol.  Caffeine (found in coffee and some sodas).  Carbonated drinks, such as soda. If certain foods cause symptoms, you should eat less of them or stop eating them. FOOD JOURNAL   Keep a journal of the foods that seem to cause distress. Write down:  What you are eating during the day and  when.  What problems you are having after eating.  When the symptoms occur in relation to your meals.  What foods always make you feel badly.  Take your notes with you to your caregiver to see if you should stop eating certain foods. FOODS THAT MAKE IBS BETTER Fiber reduces IBS symptoms, especially constipation, because it makes stools soft, bulky, and easier to pass. Fiber is found in bran, bread, cereal, beans, fruit, and vegetables. Examples of foods with fiber include:  Apples.  Peaches.  Pears.  Berries.  Figs.  Broccoli, raw.  Cabbage.  Carrots.  Raw peas.  Kidney beans.  Lima beans.  Whole-grain bread.  Whole-grain cereal. Add foods with fiber to your diet a little at a time. This will let your body get used to them. Too much fiber at once might cause gas and swelling of your abdomen. This can trigger symptoms in a person with IBS. Caregivers usually recommend a diet with enough fiber to produce soft, painless bowel movements. High fiber diets may cause gas and bloating. However, these symptoms often go away within a few weeks, as your body adjusts. In many cases, dietary fiber may lessen IBS symptoms, particularly constipation. However, it may not help pain or diarrhea. High fiber diets keep the colon mildly enlarged (distended) with the added fiber.  This may help prevent spasms in the colon. Some forms of fiber also keep water in the stool, thereby preventing hard stools that are difficult to pass.  Besides telling you to eat more foods with fiber, your caregiver may also tell you to get more fiber by taking a fiber pill or drinking water mixed with a special high fiber powder. An example of this is a natural fiber laxative containing psyllium seed.  TIPS  Large meals can cause cramping and diarrhea in people with IBS. If this happens to you, try eating 4 or 5 small meals a day, or try eating less at each of your usual 3 meals. It may also help if your meals are low  in fat and high in carbohydrates. Examples of carbohydrates are pasta, rice, whole-grain breads and cereals, fruits, and vegetables.  If dairy products cause your symptoms to flare up, you can try eating less of those foods. You might be able to handle yogurt better than other dairy products, because it contains bacteria that helps with digestion. Dairy products are an important source of calcium and other nutrients. If you need to avoid dairy products, be sure to talk with a Registered Dietitian about getting these nutrients through other food sources.  Drink enough water and fluids to keep your urine clear or pale yellow. This is important, especially if you have diarrhea. FOR MORE INFORMATION  International Foundation for Functional Gastrointestinal Disorders: www.iffgd.org  National Digestive Diseases Information Clearinghouse: digestive.StageSync.si Document Released: 08/27/2003 Document Revised: 08/29/2011 Document Reviewed: 05/14/2007 [facility] Patient Information 2014 [place], [place].

## 2013-04-09 NOTE — Addendum Note (Signed)
Addended by: Doneen Poisson D on: 04/09/2013 10:22 AM   Modules accepted: Level of Service

## 2013-04-09 NOTE — Progress Notes (Signed)
Case discussed with Dr. Glenn at time of visit.  We reviewed the resident's history and exam and pertinent patient test results.  I agree with the assessment, diagnosis, and plan of care documented in the resident's note. 

## 2013-04-23 ENCOUNTER — Encounter: Payer: Self-pay | Admitting: Internal Medicine

## 2013-04-24 ENCOUNTER — Encounter: Payer: Self-pay | Admitting: Internal Medicine

## 2013-04-24 ENCOUNTER — Ambulatory Visit (INDEPENDENT_AMBULATORY_CARE_PROVIDER_SITE_OTHER): Payer: Medicare Other | Admitting: Internal Medicine

## 2013-04-24 VITALS — BP 149/86 | HR 76 | Temp 98.1°F | Ht 61.75 in | Wt 158.2 lb

## 2013-04-24 DIAGNOSIS — M109 Gout, unspecified: Secondary | ICD-10-CM

## 2013-04-24 MED ORDER — COLCHICINE 0.6 MG PO TABS: 0.6000 mg | ORAL_TABLET | Freq: Two times a day (BID) | ORAL | Status: DC

## 2013-04-24 NOTE — Progress Notes (Signed)
Subjective:     Patient ID: Alexis Kline, female   DOB: 12-15-1945, 67 y.o.   MRN: [id number]  HPI The patient is a 67 year old female is coming in today in response to an e-mail she sent to our clinic yesterday. She states she was having headache and then that resolved and she started having left hand pain. She does occasionally get headaches and this one was not worse than usual. She had not injured the hand. She denies falling on the hand or doing any extra strenuous activity with it. She does have history of multiple surgeries in that hand for carpal tunnel release. She tried heat packs and ice packs without relief. She stated that it was red and even a light touch on the hand made it scream in pain. She describes that her pain threshold is higher than the average person with vivid example. Her other past medical history is hypertension, hyperlipidemia, irritable bowel constipation predominant. She denies chest pain or abdominal pain or SOB or diaphoresis during the episode. She is not having dyspnea.   Review of Systems  Constitutional: Negative for fever, chills, diaphoresis, activity change, appetite change, fatigue and unexpected weight change.  Respiratory: Negative for cough, chest tightness, shortness of breath and wheezing.   Cardiovascular: Negative for chest pain, palpitations and leg swelling.  Gastrointestinal: Negative for nausea, vomiting, abdominal pain, diarrhea and constipation.  Musculoskeletal: Positive for arthralgias and joint swelling. Negative for back pain, gait problem, neck pain and neck stiffness.  Skin: Negative.   Neurological: Negative.   Psychiatric/Behavioral: Negative.        Objective:   Physical Exam  Constitutional: She is oriented to person, place, and time. She appears well-developed and well-nourished.  In mild distress  HENT:  Head: Normocephalic and atraumatic.  Eyes: EOM are normal. Pupils are equal, round, and reactive to light.  Neck:  Normal range of motion. Neck supple.  Cardiovascular: Normal rate and regular rhythm.   No murmur heard. Pulmonary/Chest: Effort normal and breath sounds normal. No respiratory distress. She has no wheezes. She has no rales.  Abdominal: Soft. Bowel sounds are normal. She exhibits no distension. There is no tenderness. There is no rebound.  Musculoskeletal: She exhibits tenderness. She exhibits no edema.  Small (<2 cm circular patch) of erythema on the anterior aspect of the wrist with minimal swelling. Tender to the touch with limited ROM of wrist due to pain.   Neurological: She is alert and oriented to person, place, and time. No cranial nerve deficit.       Assessment/Plan:   1. Hand pain- On exam and history most likely explanation is gout given the typical features. She states she is unable to take NSAIDS and prednisone due to previous bad reaction with steroid injection in her shoulder. Will trial colchicine and have her return if no improvement in 1 week. She will then come back with her PCP in 1-2 months for uric acid level while not in a flare. There is family history of gout. Other less likely differential include OA or RA, infection (very unlikely), injury (patient adamantly denies injury), recurrent problems with her carpal tunnel. Will call patient tomorrow to check on her hand and is not improving with have her seen Thursday or Friday this week to exclude septic joint.   2. Disposition-patient will be seen back as previously scheduled with her PCP. She is to call us back or return if she has increasing or worsening pain.

## 2013-04-24 NOTE — Patient Instructions (Signed)
We think that you might have gout in your wrist. We are giving you a medicine called colchicine to help with the pain since you cannot take naproxen. Take the colchicine 1 pill 2 times per day for 1 week. If your pain is no better please call us to come back.  If the pain goes away, keep taking the medicine for about 2-3 weeks and return in about 1-2 months to check some blood tests for gout.   Call us with questions or problems at [phone].  Gout Gout is an inflammatory arthritis caused by a buildup of uric acid crystals in the joints. Uric acid is a chemical that is normally present in the blood. When the level of uric acid in the blood is too high it can form crystals that deposit in your joints and tissues. This causes joint redness, soreness, and swelling (inflammation). Repeat attacks are common. Over time, uric acid crystals can form into masses (tophi) near a joint, destroying bone and causing disfigurement. Gout is treatable and often preventable. CAUSES  The disease begins with elevated levels of uric acid in the blood. Uric acid is produced by your body when it breaks down a naturally found substance called purines. Certain foods you eat, such as meats and fish, contain high amounts of purines. Causes of an elevated uric acid level include:  Being passed down from parent to child (heredity).  Diseases that cause increased uric acid production (such as obesity, psoriasis, and certain cancers).  Excessive alcohol use.  Diet, especially diets rich in meat and seafood.  Medicines, including certain cancer-fighting medicines (chemotherapy), water pills (diuretics), and aspirin.  Chronic kidney disease. The kidneys are no longer able to remove uric acid well.  Problems with metabolism. Conditions strongly associated with gout include:  Obesity.  High blood pressure.  High cholesterol.  Diabetes. Not everyone with elevated uric acid levels gets gout. It is not understood why  some people get gout and others do not. Surgery, joint injury, and eating too much of certain foods are some of the factors that can lead to gout attacks. SYMPTOMS   An attack of gout comes on quickly. It causes intense pain with redness, swelling, and warmth in a joint.  Fever can occur.  Often, only one joint is involved. Certain joints are more commonly involved:  Base of the big toe.  Knee.  Ankle.  Wrist.  Finger. Without treatment, an attack usually goes away in a few days to weeks. Between attacks, you usually will not have symptoms, which is different from many other forms of arthritis. DIAGNOSIS  Your caregiver will suspect gout based on your symptoms and exam. In some cases, tests may be recommended. The tests may include:  Blood tests.  Urine tests.  X-rays.  Joint fluid exam. This exam requires a needle to remove fluid from the joint (arthrocentesis). Using a microscope, gout is confirmed when uric acid crystals are seen in the joint fluid. TREATMENT  There are two phases to gout treatment: treating the sudden onset (acute) attack and preventing attacks (prophylaxis).  Treatment of an Acute Attack.  Medicines are used. These include anti-inflammatory medicines or steroid medicines.  An injection of steroid medicine into the affected joint is sometimes necessary.  The painful joint is rested. Movement can worsen the arthritis.  You may use warm or cold treatments on painful joints, depending which works best for you.  Treatment to Prevent Attacks.  If you suffer from frequent gout attacks, your caregiver may  advise preventive medicine. These medicines are started after the acute attack subsides. These medicines either help your kidneys eliminate uric acid from your body or decrease your uric acid production. You may need to stay on these medicines for a very long time.  The early phase of treatment with preventive medicine can be associated with an increase in  acute gout attacks. For this reason, during the first few months of treatment, your caregiver may also advise you to take medicines usually used for acute gout treatment. Be sure you understand your caregiver's directions. Your caregiver may make several adjustments to your medicine dose before these medicines are effective.  Discuss dietary treatment with your caregiver or dietitian. Alcohol and drinks high in sugar and fructose and foods such as meat, poultry, and seafood can increase uric acid levels. Your caregiver or dietician can advise you on drinks and foods that should be limited. HOME CARE INSTRUCTIONS   Do not take aspirin to relieve pain. This raises uric acid levels.  Only take over-the-counter or prescription medicines for pain, discomfort, or fever as directed by your caregiver.  Rest the joint as much as possible. When in bed, keep sheets and blankets off painful areas.  Keep the affected joint raised (elevated).  Apply warm or cold treatments to painful joints. Use of warm or cold treatments depends on which works best for you.  Use crutches if the painful joint is in your leg.  Drink enough fluids to keep your urine clear or pale yellow. This helps your body get rid of uric acid. Limit alcohol, sugary drinks, and fructose drinks.  Follow your dietary instructions. Pay careful attention to the amount of protein you eat. Your daily diet should emphasize fruits, vegetables, whole grains, and fat-free or low-fat milk products. Discuss the use of coffee, vitamin C, and cherries with your caregiver or dietician. These may be helpful in lowering uric acid levels.  Maintain a healthy body weight. SEEK MEDICAL CARE IF:   You develop diarrhea, vomiting, or any side effects from medicines.  You do not feel better in 24 hours, or you are getting worse. SEEK IMMEDIATE MEDICAL CARE IF:   Your joint becomes suddenly more tender, and you have chills or a fever. MAKE SURE YOU:    Understand these instructions.  Will watch your condition.  Will get help right away if you are not doing well or get worse. Document Released: 06/03/2000 Document Revised: 10/01/2012 Document Reviewed: 01/18/2012 [facility] Patient Information 2014 [place], [place].

## 2013-04-25 NOTE — Progress Notes (Signed)
Case discussed with Dr. Dorise Hiss at the time of the visit.  We reviewed the resident's history and exam and pertinent patient test results.  I agree with the assessment, diagnosis, and plan of care documented in the resident's note.  I called the patient today to check on her and she reported that her hand pain was "almost gone". However she still has not received colchicine from her pharmacy due to apparent pre-authorization conflict with the insurance company (as per patient report). I informed the patient that she might not need to take the pain medication at all if the pain is better and she expressed understanding. I request Dr. Dorise Hiss to please look in to the issue if needed when the patient comes back for a recheck.

## 2013-05-06 ENCOUNTER — Telehealth: Payer: Self-pay | Admitting: *Deleted

## 2013-05-06 NOTE — Telephone Encounter (Signed)
Attempted to complete PA over the phone, spoke with prescribing MD, "pt no longer needs this rx"  PA cancelled and pharmacy informed to d/c rx.Kingsley Spittle Cassady11/17/201410:53 AM

## 2013-05-06 NOTE — Telephone Encounter (Signed)
Received PA request from pt's pharmacy for the colcrys 0.6mg  tabs take one tab twice daily # 60.  Contacted pt's insurance at [phone] to initiate PA.

## 2013-06-26 ENCOUNTER — Other Ambulatory Visit: Payer: Self-pay | Admitting: *Deleted

## 2013-06-27 MED ORDER — OMEPRAZOLE 40 MG PO CPDR: DELAYED_RELEASE_CAPSULE | ORAL | Status: DC

## 2013-07-12 ENCOUNTER — Ambulatory Visit (INDEPENDENT_AMBULATORY_CARE_PROVIDER_SITE_OTHER): Payer: Medicare Other | Admitting: Internal Medicine

## 2013-07-12 ENCOUNTER — Encounter: Payer: Self-pay | Admitting: Internal Medicine

## 2013-07-12 VITALS — BP 126/88 | HR 69 | Temp 96.7°F | Ht 61.0 in | Wt 158.2 lb

## 2013-07-12 DIAGNOSIS — I2510 Atherosclerotic heart disease of native coronary artery without angina pectoris: Secondary | ICD-10-CM

## 2013-07-12 DIAGNOSIS — I10 Essential (primary) hypertension: Secondary | ICD-10-CM

## 2013-07-12 DIAGNOSIS — K589 Irritable bowel syndrome without diarrhea: Secondary | ICD-10-CM

## 2013-07-12 DIAGNOSIS — M109 Gout, unspecified: Secondary | ICD-10-CM | POA: Diagnosis not present

## 2013-07-12 DIAGNOSIS — J449 Chronic obstructive pulmonary disease, unspecified: Secondary | ICD-10-CM | POA: Diagnosis not present

## 2013-07-12 DIAGNOSIS — F17200 Nicotine dependence, unspecified, uncomplicated: Secondary | ICD-10-CM | POA: Diagnosis not present

## 2013-07-12 DIAGNOSIS — G894 Chronic pain syndrome: Secondary | ICD-10-CM

## 2013-07-12 DIAGNOSIS — J4489 Other specified chronic obstructive pulmonary disease: Secondary | ICD-10-CM

## 2013-07-12 DIAGNOSIS — E785 Hyperlipidemia, unspecified: Secondary | ICD-10-CM

## 2013-07-12 LAB — URIC ACID: Uric Acid, Serum: 6.4 mg/dL (ref 2.4–7.0)

## 2013-07-12 MED ORDER — HYDROCODONE-ACETAMINOPHEN 5-325 MG PO TABS: 1.0000 | ORAL_TABLET | Freq: Four times a day (QID) | ORAL | Status: DC | PRN

## 2013-07-12 NOTE — Assessment & Plan Note (Signed)
Continue Pravachol.  Lipid profile in 12/2013.

## 2013-07-12 NOTE — Assessment & Plan Note (Signed)
Patient quit smoking in 03/2013 and has maintained thus far.  I congratulated her on this accomplishment.

## 2013-07-12 NOTE — Assessment & Plan Note (Signed)
Stable.  No chest pain or NTG use in long time.  Continue ASA, Coreg.

## 2013-07-12 NOTE — Assessment & Plan Note (Signed)
Stable.  No shortness of breath.  Has not had to use Albuterol inhaler in long time.
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
## 2013-07-12 NOTE — Assessment & Plan Note (Signed)
BP Readings from Last 3 Encounters:  07/12/13 126/88  04/24/13 149/86  04/08/13 127/88    Lab Results  Component Value Date   NA 140 12/19/2012   K 4.2 12/19/2012   CREATININE 0.82 12/19/2012    Assessment: Blood pressure control:  controlled Progress toward BP goal:   at goal  Plan: Medications:  continue current medications

## 2013-07-12 NOTE — Assessment & Plan Note (Signed)
Stable, patient happy with her current regimen.  She is having daily BMs, occasionally loose, occasionally more constipated but regular.

## 2013-07-12 NOTE — Assessment & Plan Note (Addendum)
Patient has chronic pain but no concerning symptoms, BP not elevated.  She does not seem to have fibromyalgia given lack of tenderness at trigger points.  Refilled Vicodin prescription today; no red flags.  No need for pain contract at this time as she will not required monthly prescriptions.  Instructed patient to take ibuprofen (no CKD) 600 mg BID for the next 3 days along with Vicodin 0.5-1 tab as needed for breakthrough pain.  If her pain is not improved in a week or so, she will call clinic and schedule another appointment.  Patient amenable with this plan.  Will still obtain uric acid today given Dr. Donneta Romberg concern for gout at last visit (but would no longer be in acute flare).  Consider adding Mobic at next visit if pain still not controlled.

## 2013-07-12 NOTE — Patient Instructions (Signed)
Please follow-up with Dr. Stann Mainland in 3 months or sooner if needed.   Keep up the good work on taking your medicines!  And congratulations on quitting smoking!!  Please buy ibuprofin/Advil/Motrin over the counter and take 600 mg (usually 3 regular strength tablets) twice per day for the next 3 days.  We are refilling your prescription for Vicodin today, only take this as needed for breakthrough pain.  Let us know if you are not feeling better in a week or so.   We are checking a blood test today to see if you may have gout, I will let you know if the results are abnormal.   Chronic Pain Chronic pain can be defined as pain that is off and on and lasts for 3 6 months or longer. Many things cause chronic pain, which can make it difficult to make a diagnosis. There are many treatment options available for chronic pain. However, finding a treatment that works well for you may require trying various approaches until the right one is found. Many people benefit from a combination of two or more types of treatment to control their pain. SYMPTOMS  Chronic pain can occur anywhere in the body and can range from mild to very severe. Some types of chronic pain include:  Headache.  Low back pain.  Cancer pain.  Arthritis pain.  Neurogenic pain. This is pain resulting from damage to nerves. People with chronic pain may also have other symptoms such as:  Depression.  Anger.  Insomnia.  Anxiety. DIAGNOSIS  Your health care provider will help diagnose your condition over time. In many cases, the initial focus will be on excluding possible conditions that could be causing the pain. Depending on your symptoms, your health care provider may order tests to diagnose your condition. Some of these tests may include:   Blood tests.   CT scan.   MRI.   X-rays.   Ultrasounds.   Nerve conduction studies.  You may need to see a specialist.  TREATMENT  Finding treatment that works well may take  time. You may be referred to a pain specialist. He or she may prescribe medicine or therapies, such as:   Mindful meditation or yoga.  Shots (injections) of numbing or pain-relieving medicines into the spine or area of pain.  Local electrical stimulation.  Acupuncture.   Massage therapy.   Aroma, color, light, or sound therapy.   Biofeedback.   Working with a physical therapist to keep from getting stiff.   Regular, gentle exercise.   Cognitive or behavioral therapy.   Group support.  Sometimes, surgery may be recommended.  HOME CARE INSTRUCTIONS   Take all medicines as directed by your health care provider.   Lessen stress in your life by relaxing and doing things such as listening to calming music.   Exercise or be active as directed by your health care provider.   Eat a healthy diet and include things such as vegetables, fruits, fish, and lean meats in your diet.   Keep all follow-up appointments with your health care provider.   Attend a support group with others suffering from chronic pain. SEEK MEDICAL CARE IF:   Your pain gets worse.   You develop a new pain that was not there before.   You cannot tolerate medicines given to you by your health care provider.   You have new symptoms since your last visit with your health care provider.  SEEK IMMEDIATE MEDICAL CARE IF:   You feel weak.  You have decreased sensation or numbness.   You lose control of bowel or bladder function.   Your pain suddenly gets much worse.   You develop shaking.  You develop chills.  You develop confusion.  You develop chest pain.  You develop shortness of breath.  MAKE SURE YOU:  Understand these instructions.  Will watch your condition.  Will get help right away if you are not doing well or get worse. Document Released: 02/26/2002 Document Revised: 02/06/2013 Document Reviewed: 11/30/2012 [facility] Patient Information 2014 [place].

## 2013-07-16 NOTE — Progress Notes (Signed)
Case discussed with Dr. Rogers at the time of the visit.  We reviewed the resident's history and exam and pertinent patient test results.  I agree with the assessment, diagnosis, and plan of care documented in the resident's note. 

## 2013-07-19 ENCOUNTER — Encounter: Payer: Self-pay | Admitting: Internal Medicine

## 2013-07-19 ENCOUNTER — Ambulatory Visit (HOSPITAL_COMMUNITY)
Admission: RE | Admit: 2013-07-19 | Discharge: 2013-07-19 | Disposition: A | Discharge status: Home / Self Care | Payer: Medicare Other | Source: Ambulatory Visit | Attending: Internal Medicine | Admitting: Internal Medicine

## 2013-07-19 ENCOUNTER — Ambulatory Visit (INDEPENDENT_AMBULATORY_CARE_PROVIDER_SITE_OTHER): Payer: Medicare Other | Admitting: Internal Medicine

## 2013-07-19 VITALS — BP 138/90 | HR 89 | Temp 96.2°F | Ht 61.0 in | Wt 159.1 lb

## 2013-07-19 DIAGNOSIS — G894 Chronic pain syndrome: Secondary | ICD-10-CM | POA: Diagnosis not present

## 2013-07-19 DIAGNOSIS — M7730 Calcaneal spur, unspecified foot: Secondary | ICD-10-CM | POA: Insufficient documentation

## 2013-07-19 DIAGNOSIS — Z0000 Encounter for general adult medical examination without abnormal findings: Secondary | ICD-10-CM

## 2013-07-19 NOTE — Progress Notes (Signed)
Patient ID: Alexis Kline, female   DOB: 01-Jul-1945, 68 y.o.   MRN: [id number]   Subjective:   Patient ID: Alexis Kline female   DOB: 1945-09-04 68 y.o.   MRN: [id number]  HPI: Alexis Kline is a 68 y.o. woman with history of HTN, HL, CAD, mild COPD, IBS who presents for follow-up on pain.   Patient states that since her visit on 1/23, her foot pain has been worsening.  She rates it as 10/10, constant, sharp, like she is stepping on a thorn.  Pain is across the top of her left foot and also in her arch; worse with walking long distances.  No pain when she presses on the bones in her foot, no pain in her heel.   Denies injury or muscle spasms.  She tried taking ibuprofen 600 mg BID but had to stop on day 2 due to nausea.    She is amenable to going to pain clinic.  She has been referred to sports medicine in past but does not wish to return there because "all they wanted to do were steroid injections, and I am allergic to steroids."  At the end of our visit, patient mentioned that she gets some relief when wearing heels.  She never has pain in her heel though.     Past Medical History  Diagnosis Date  . Hypertension   . HLD (hyperlipidemia)   . GERD (gastroesophageal reflux disease)   . COPD (chronic obstructive pulmonary disease)   . Hemorrhoids   . CAD (coronary artery disease) 2007    nonobstructive, 30% LAD 02/2006  . Fibroadenoma 2008    right breast - based on [place] 09/2006  . HYSTERECTOMY, HX OF 07/10/2006    Qualifier: Diagnosis of  By: Oretha Ellis    . APPENDECTOMY, HX OF 07/10/2006    Qualifier: Diagnosis of  By: Oretha Ellis     Current Outpatient Prescriptions  Medication Sig Dispense Refill  . albuterol (VENTOLIN HFA) 108 (90 BASE) MCG/ACT inhaler Inhale 1-2 puffs into the lungs every 4 (four) hours as needed. For shortness of breath, cough, or wheezing       . aspirin 81 MG EC tablet Take 1 tablet (81 mg total) by mouth daily.  30 tablet   5  . B Complex-Biotin-FA (SUPER B-COMPLEX PO) Take 1 tablet by mouth daily.        . carvedilol (COREG) 12.5 MG tablet Take 1 tablet (12.5 mg total) by mouth 2 (two) times daily with a meal.  180 tablet  3  . hydrochlorothiazide (HYDRODIURIL) 25 MG tablet Take 1 tablet (25 mg total) by mouth daily.  30 tablet  11  . HYDROcodone-acetaminophen (NORCO/VICODIN) 5-325 MG per tablet Take 1 tablet by mouth every 6 (six) hours as needed.  45 tablet  0  . lidocaine (LIDODERM) 5 % Place 1 patch onto the skin daily. Remove & Discard patch within 12 hours or as directed by MD  30 patch  0  . naproxen (NAPROSYN) 500 MG tablet Take 1 tablet (500 mg total) by mouth 2 (two) times daily with a meal.  60 tablet  2  . nitroGLYCERIN (NITROSTAT) 0.4 MG SL tablet Place 1 tablet (0.4 mg total) under the tongue every 5 (five) minutes. up to 3 times as needed for chest pain   25 tablet  1  . omeprazole (PRILOSEC) 40 MG capsule TAKE ONE (1) CAPSULE EACH DAY  30 capsule  3  . polyethylene glycol  powder (GLYCOLAX/MIRALAX) powder Take 17 g by mouth 2 (two) times daily.  850 g  2  . pravastatin (PRAVACHOL) 40 MG tablet TAKE ONE TABLET AT BEDTIME  30 tablet  11  . psyllium (HYDROCIL/METAMUCIL) 95 % PACK Start with one packet once daily dissolved in water or juice. Increased up to one pack 3 times daily as needed for constipation.  500 each  1  . senna-docusate (SENOKOT-S) 8.6-50 MG per tablet Take 2 tablets by mouth daily.  60 tablet  2  . tiZANidine (ZANAFLEX) 2 MG tablet Take 1 tablet (2 mg total) by mouth every 8 (eight) hours as needed.  30 tablet  0   No current facility-administered medications for this visit.   Family History  Problem Relation Age of Onset  . Hyperlipidemia Mother   . Hypertension Mother   . Hyperlipidemia Father   . Hypertension Father   . Heart disease Sister   . Stroke Neg Hx   . Diabetes Neg Hx    History   Social History  . Marital Status: Widowed    Spouse Name: N/A    Number of  Children: N/A  . Years of Education: N/A   Social History Main Topics  . Smoking status: Former Smoker    Types: Cigarettes    Quit date: 06/20/2013  . Smokeless tobacco: None     Comment: electronic cig  . Alcohol Use: No  . Drug Use: No  . Sexual Activity: Not Currently   Other Topics Concern  . None   Social History Narrative  . None   Review of Systems: Review of Systems  Constitutional: Negative for fever.  HENT:       Chronic headaches  Eyes: Negative for blurred vision.  Respiratory: Negative for shortness of breath.   Cardiovascular: Negative for chest pain.  Gastrointestinal: Positive for nausea. Negative for vomiting, abdominal pain and blood in stool.       Nausea with ibuprofen after last visit  Genitourinary: Negative for dysuria.  Musculoskeletal: Positive for joint pain.       Left shoulder, both feet  Neurological: Positive for headaches. Negative for dizziness and loss of consciousness.    Objective:  Physical Exam: Filed Vitals:   07/19/13 1614  BP: 138/90  Pulse: 89  Temp: 96.2 F (35.7 C)  TempSrc: Oral  Height: 5\' 1"  (1.549 m)  Weight: 159 lb 1.6 oz (72.167 kg)  SpO2: 98%   PEX General: alert, cooperative, and in no apparent distress HEENT: NCAT, vision grossly intact Neck: supple, no lymphadenopathy Lungs: clear to ascultation bilaterally, normal work of respiration, no wheezes, rales, ronchi Heart: regular rate and rhythm, no murmurs, gallops, or rubs Abdomen: soft, non-tender, non-distended, normal bowel sounds Extremities: no erythema, swelling, or point tenderness of either foot, 2+ DP/PT pulses bilaterally, no cyanosis, clubbing, or edema Neurologic: alert & oriented X3, cranial nerves II-XII intact, strength grossly intact, sensation intact to light touch  Assessment & Plan:  Patient discussed with Dr. Daryll Drown.  Please see problem-based assessment and plan.

## 2013-07-19 NOTE — Assessment & Plan Note (Signed)
Will consider ordering Dexa at next visit.

## 2013-07-19 NOTE — Assessment & Plan Note (Addendum)
Patient returned to clinic today for pain complaints, specifically pain in her left foot (see HPI).  Nothing concerning on exam.  Unclear etiology but will image the foot to rule out silent fracture.  Concern for depression due to chronic pain (or vice versa), but patient screened negative for depression on PHQ-2.  Also concern for gout at her visit in 04/2013 but uric acid within normal limits at visit last week.  Some concern for plantar fasciitis at end of visit given her observation that pain improved when wearing heels; however, patient does not have pain in her heels.  She has a roller at home that she uses on her shoulder; has tried this on her foot as well with little relief.    Refilled Vicodin at her last visit and told her she should take this for severe pain.  Considered referring patient for Dexa scan but decided to wait for results of foot xray first.  -Will obtain complete xray of left foot. -Emphasized importance of ice to reduce inflammation given that she felt nauseated with ibuprofen and does not want steroid injections.   -Referral to pain clinic.

## 2013-07-19 NOTE — Patient Instructions (Addendum)
Please take your medicines as prescribed.  We are sending you to have an xray of your left foot to be sure you don't have any broken bones.  We will call you if the results are abnormal.   We are also referring you to the pain clinic to help with your chronic pain; they will call you with the appointment time.  In the meantime, please follow the following instructions to hopefully help with your pain.  Based on what you told me at the end of our visit (foot pain improved when wearing heels), you may be suffering from plantar fasciitis.  You should try icing your feet as much as possible to reduce inflammation, especially since you feel that anti-inflammatory medicines make you nauseous, and you are not interested in steroid injections.    Plantar Fasciitis (Heel Spur Syndrome) with Rehab The plantar fascia is a fibrous, ligament-like, soft-tissue structure that spans the bottom of the foot. Plantar fasciitis is a condition that causes pain in the foot due to inflammation of the tissue. SYMPTOMS   Pain and tenderness on the underneath side of the foot.  Pain that worsens with standing or walking. CAUSES  Plantar fasciitis is caused by irritation and injury to the plantar fascia on the underneath side of the foot. Common mechanisms of injury include:  Direct trauma to bottom of the foot.  Damage to a small nerve that runs under the foot where the main fascia attaches to the heel bone.  Stress placed on the plantar fascia due to bone spurs. RISK INCREASES WITH:   Activities that place stress on the plantar fascia (running, jumping, pivoting, or cutting).  Poor strength and flexibility.  Improperly fitted shoes.  Tight calf muscles.  Flat feet.  Failure to warm-up properly before activity.  Obesity. PREVENTION  Warm up and stretch properly before activity.  Allow for adequate recovery between workouts.  Maintain physical fitness:  Strength, flexibility, and  endurance.  Cardiovascular fitness.  Maintain a health body weight.  Avoid stress on the plantar fascia.  Wear properly fitted shoes, including arch supports for individuals who have flat feet. PROGNOSIS  If treated properly, then the symptoms of plantar fasciitis usually resolve without surgery. However, occasionally surgery is necessary. RELATED COMPLICATIONS   Recurrent symptoms that may result in a chronic condition.  Problems of the lower back that are caused by compensating for the injury, such as limping.  Pain or weakness of the foot during push-off following surgery.  Chronic inflammation, scarring, and partial or complete fascia tear, occurring more often from repeated injections. TREATMENT  Treatment initially involves the use of ice and medication to help reduce pain and inflammation. The use of strengthening and stretching exercises may help reduce pain with activity, especially stretches of the Achilles tendon. These exercises may be performed at home or with a therapist. Your caregiver may recommend that you use heel cups of arch supports to help reduce stress on the plantar fascia. Occasionally, corticosteroid injections are given to reduce inflammation. If symptoms persist for greater than 6 months despite non-surgical (conservative), then surgery may be recommended.  MEDICATION   If pain medication is necessary, then nonsteroidal anti-inflammatory medications, such as aspirin and ibuprofen, or other minor pain relievers, such as acetaminophen, are often recommended.  Do not take pain medication within 7 days before surgery.  Prescription pain relievers may be given if deemed necessary by your caregiver. Use only as directed and only as much as you need.  Corticosteroid injections may  be given by your caregiver. These injections should be reserved for the most serious cases, because they may only be given a certain number of times. HEAT AND COLD  Cold treatment  (icing) relieves pain and reduces inflammation. Cold treatment should be applied for 10 to 15 minutes every 2 to 3 hours for inflammation and pain and immediately after any activity that aggravates your symptoms. Use ice packs or massage the area with a piece of ice (ice massage).  Heat treatment may be used prior to performing the stretching and strengthening activities prescribed by your caregiver, physical therapist, or athletic trainer. Use a heat pack or soak the injury in warm water. SEEK IMMEDIATE MEDICAL CARE IF:  Treatment seems to offer no benefit, or the condition worsens.  Any medications produce adverse side effects. EXERCISES RANGE OF MOTION (ROM) AND STRETCHING EXERCISES - Plantar Fasciitis (Heel Spur Syndrome) These exercises may help you when beginning to rehabilitate your injury. Your symptoms may resolve with or without further involvement from your physician, physical therapist or athletic trainer. While completing these exercises, remember:   Restoring tissue flexibility helps normal motion to return to the joints. This allows healthier, less painful movement and activity.  An effective stretch should be held for at least 30 seconds.  A stretch should never be painful. You should only feel a gentle lengthening or release in the stretched tissue. RANGE OF MOTION - Toe Extension, Flexion  Sit with your right / left leg crossed over your opposite knee.  Grasp your toes and gently pull them back toward the top of your foot. You should feel a stretch on the bottom of your toes and/or foot.  Hold this stretch for __________ seconds.  Now, gently pull your toes toward the bottom of your foot. You should feel a stretch on the top of your toes and or foot.  Hold this stretch for __________ seconds. Repeat __________ times. Complete this stretch __________ times per day.  RANGE OF MOTION - Ankle Dorsiflexion, Active Assisted  Remove shoes and sit on a chair that is  preferably not on a carpeted surface.  Place right / left foot under knee. Extend your opposite leg for support.  Keeping your heel down, slide your right / left foot back toward the chair until you feel a stretch at your ankle or calf. If you do not feel a stretch, slide your bottom forward to the edge of the chair, while still keeping your heel down.  Hold this stretch for __________ seconds. Repeat __________ times. Complete this stretch __________ times per day.  STRETCH  Gastroc, Standing  Place hands on wall.  Extend right / left leg, keeping the front knee somewhat bent.  Slightly point your toes inward on your back foot.  Keeping your right / left heel on the floor and your knee straight, shift your weight toward the wall, not allowing your back to arch.  You should feel a gentle stretch in the right / left calf. Hold this position for __________ seconds. Repeat __________ times. Complete this stretch __________ times per day. STRETCH  Soleus, Standing  Place hands on wall.  Extend right / left leg, keeping the other knee somewhat bent.  Slightly point your toes inward on your back foot.  Keep your right / left heel on the floor, bend your back knee, and slightly shift your weight over the back leg so that you feel a gentle stretch deep in your back calf.  Hold this position for __________  seconds. Repeat __________ times. Complete this stretch __________ times per day. STRETCH  Gastrocsoleus, Standing  Note: This exercise can place a lot of stress on your foot and ankle. Please complete this exercise only if specifically instructed by your caregiver.   Place the ball of your right / left foot on a step, keeping your other foot firmly on the same step.  Hold on to the wall or a rail for balance.  Slowly lift your other foot, allowing your body weight to press your heel down over the edge of the step.  You should feel a stretch in your right / left calf.  Hold this  position for __________ seconds.  Repeat this exercise with a slight bend in your right / left knee. Repeat __________ times. Complete this stretch __________ times per day.  STRENGTHENING EXERCISES - Plantar Fasciitis (Heel Spur Syndrome)  These exercises may help you when beginning to rehabilitate your injury. They may resolve your symptoms with or without further involvement from your physician, physical therapist or athletic trainer. While completing these exercises, remember:   Muscles can gain both the endurance and the strength needed for everyday activities through controlled exercises.  Complete these exercises as instructed by your physician, physical therapist or athletic trainer. Progress the resistance and repetitions only as guided. STRENGTH - Towel Curls  Sit in a chair positioned on a non-carpeted surface.  Place your foot on a towel, keeping your heel on the floor.  Pull the towel toward your heel by only curling your toes. Keep your heel on the floor.  If instructed by your physician, physical therapist or athletic trainer, add ____________________ at the end of the towel. Repeat __________ times. Complete this exercise __________ times per day. STRENGTH - Ankle Inversion  Secure one end of a rubber exercise band/tubing to a fixed object (table, pole). Loop the other end around your foot just before your toes.  Place your fists between your knees. This will focus your strengthening at your ankle.  Slowly, pull your big toe up and in, making sure the band/tubing is positioned to resist the entire motion.  Hold this position for __________ seconds.  Have your muscles resist the band/tubing as it slowly pulls your foot back to the starting position. Repeat __________ times. Complete this exercises __________ times per day.  Document Released: 06/06/2005 Document Revised: 08/29/2011 Document Reviewed: 09/18/2008 [facility] Patient Information 2014 [place],  [place].   RICE: Routine Care for Injuries The routine care of many injuries includes Rest, Ice, Compression, and Elevation (RICE). HOME CARE INSTRUCTIONS  Rest is needed to allow your body to heal. Routine activities can usually be resumed when comfortable. Injured tendons and bones can take up to 6 weeks to heal. Tendons are the cord-like structures that attach muscle to bone.  Ice following an injury helps keep the swelling down and reduces pain.  Put ice in a plastic bag.  Place a towel between your skin and the bag.  Leave the ice on for 15-20 minutes, 03-04 times a day. Do this while awake, for the first 24 to 48 hours. After that, continue as directed by your caregiver.  Compression helps keep swelling down. It also gives support and helps with discomfort. If an elastic bandage has been applied, it should be removed and reapplied every 3 to 4 hours. It should not be applied tightly, but firmly enough to keep swelling down. Watch fingers or toes for swelling, bluish discoloration, coldness, numbness, or excessive pain. If any of  these problems occur, remove the bandage and reapply loosely. Contact your caregiver if these problems continue.  Elevation helps reduce swelling and decreases pain. With extremities, such as the arms, hands, legs, and feet, the injured area should be placed near or above the level of the heart, if possible. SEEK IMMEDIATE MEDICAL CARE IF:  You have persistent pain and swelling.  You develop redness, numbness, or unexpected weakness.  Your symptoms are getting worse rather than improving after several days. These symptoms may indicate that further evaluation or further X-rays are needed. Sometimes, X-rays may not show a small broken bone (fracture) until 1 week or 10 days later. Make a follow-up appointment with your caregiver. Ask when your X-ray results will be ready. Make sure you get your X-ray results. Document Released: 09/18/2000 Document Revised:  08/29/2011 Document Reviewed: 11/05/2010 [facility] Patient Information 2014 [place], [place].

## 2013-07-22 NOTE — Progress Notes (Signed)
Case discussed with Dr. Rogers at the time of the visit.  We reviewed the resident's history and exam and pertinent patient test results.  I agree with the assessment, diagnosis, and plan of care documented in the resident's note. 

## 2013-09-10 ENCOUNTER — Encounter: Payer: Self-pay | Admitting: Internal Medicine

## 2013-09-12 ENCOUNTER — Encounter: Payer: Self-pay | Admitting: Physical Medicine & Rehabilitation

## 2013-09-24 ENCOUNTER — Other Ambulatory Visit: Payer: Self-pay | Admitting: *Deleted

## 2013-09-24 MED ORDER — PRAVASTATIN SODIUM 40 MG PO TABS: ORAL_TABLET | ORAL | Status: DC

## 2013-10-12 ENCOUNTER — Encounter: Payer: Self-pay | Admitting: Internal Medicine

## 2013-10-22 ENCOUNTER — Encounter: Payer: Medicare Other | Attending: Physical Medicine & Rehabilitation

## 2013-10-22 ENCOUNTER — Ambulatory Visit (HOSPITAL_BASED_OUTPATIENT_CLINIC_OR_DEPARTMENT_OTHER): Payer: Medicare Other | Admitting: Physical Medicine & Rehabilitation

## 2013-10-22 ENCOUNTER — Other Ambulatory Visit: Payer: Self-pay | Admitting: Physical Medicine & Rehabilitation

## 2013-10-22 ENCOUNTER — Encounter: Payer: Self-pay | Admitting: Physical Medicine & Rehabilitation

## 2013-10-22 VITALS — BP 157/69 | HR 75 | Resp 14 | Ht 61.0 in | Wt 160.0 lb

## 2013-10-22 DIAGNOSIS — J449 Chronic obstructive pulmonary disease, unspecified: Secondary | ICD-10-CM | POA: Diagnosis not present

## 2013-10-22 DIAGNOSIS — I10 Essential (primary) hypertension: Secondary | ICD-10-CM | POA: Diagnosis not present

## 2013-10-22 DIAGNOSIS — G8929 Other chronic pain: Secondary | ICD-10-CM | POA: Diagnosis not present

## 2013-10-22 DIAGNOSIS — M549 Dorsalgia, unspecified: Secondary | ICD-10-CM

## 2013-10-22 DIAGNOSIS — M4802 Spinal stenosis, cervical region: Secondary | ICD-10-CM | POA: Diagnosis not present

## 2013-10-22 DIAGNOSIS — J4489 Other specified chronic obstructive pulmonary disease: Secondary | ICD-10-CM | POA: Insufficient documentation

## 2013-10-22 DIAGNOSIS — Z5181 Encounter for therapeutic drug level monitoring: Secondary | ICD-10-CM | POA: Diagnosis not present

## 2013-10-22 DIAGNOSIS — I2510 Atherosclerotic heart disease of native coronary artery without angina pectoris: Secondary | ICD-10-CM | POA: Diagnosis not present

## 2013-10-22 DIAGNOSIS — M47817 Spondylosis without myelopathy or radiculopathy, lumbosacral region: Secondary | ICD-10-CM | POA: Insufficient documentation

## 2013-10-22 DIAGNOSIS — Z79899 Other long term (current) drug therapy: Secondary | ICD-10-CM

## 2013-10-22 DIAGNOSIS — K219 Gastro-esophageal reflux disease without esophagitis: Secondary | ICD-10-CM | POA: Diagnosis not present

## 2013-10-22 NOTE — Progress Notes (Signed)
Subjective:    Patient ID: Alexis Kline, female    DOB: 02/15/1946, 68 y.o.   MRN: [id number]  HPI Chief complaint pain from the neck to the feet History of present illness Patient gives a remote work injury in the early 1990s. This resulted in bilateral rotator cuff surgery as well as bilateral carpal tunnel surgery performed in [place] between 1990 one in 1993.  More recently the patient has complained of back pain and neck pain. 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 She did have some postoperative physical therapy in [place] in the early 1990s  Takes hydrocodone 5 mg once or twice a month. Also takes over-the-counter medication such as ibuprofen  Goals resume walking (used to walk 24miles a day in 2011) Increase speed of house work, cleaning takes much longer  Pain Inventory Average Pain 8 Pain Right Now 9 My pain is sharp, stabbing and aching  In the last 24 hours, has pain interfered with the following? General activity 7 Relation with others 0 Enjoyment of life 10 What TIME of day is your pain at its worst? evening and night Sleep (in general) Fair  Pain is worse with: walking, bending, sitting, standing and some activites Pain improves with: heat/ice and TENS Relief from Meds: 3  Mobility walk without assistance how many minutes can you walk? depends on pain ability to climb steps?  no do you drive?  no  Function disabled: date disabled 63 I need assistance with the  following:  household duties  Neuro/Psych weakness trouble walking dizziness  Prior Studies Any changes since last visit?  no Clinical Data:   Syringohydromyelia.  Severe back pain.  Bilateral arm weakness.   MRI THORACIC SPINE WITHOUT AND WITH CONTRAST   Technique:  Multiplanar and multiecho pulse sequences of the thoracic spine were obtained without and with intravenous contrast.   Contrast: 21mL MULTIHANCE GADOBENATE DIMEGLUMINE 529 MG/ML IV SOLN   Comparison: Two-view chest x-ray 01/28/2010.   Findings: There is slight dilation of the central canal at T5 and again from T8-9 through T10.  Slightly more prominent dilation of the central canal is present from T10-11 through L1.  The conus medullaris terminates at the L1-2, within normal limits.  There is no expansion of the cord.  No pathologic enhancement is present. No focal mass lesion is evident.   Marrow signal is normal.  Mild leftward curvature of the thoracic spine is centered at to T9.  The superior endplate Schmorl's node is evident at T11.  The vertebral body heights are otherwise maintained.   Multiple cystic lesions are present within the liver, similar to the prior exam.   IMPRESSION:   1.  Segmental swelling of hydromyelia of the thoracic cord without focal cord expansion or mass lesion.  This is likely an incidental finding. 2.  Multiple cystic lesions of the liver.  These are incompletely characterized, but appear benign.  Dedicated MRI of the liver may be useful for further evaluation. 3.  Superior endplate Schmorl's node at T11.   MRI CERVICAL  SPINE WITHOUT CONTRAST   Technique:  Multiplanar and multiecho pulse sequences of the cervic al spine, to include the craniocervical junction and cervicothoraci c junction, were obtained according to standard protocol without intravenous contrast.   Findings:  Cervicomedullary junction is within normal limits.  No cerebellar tonsillar ectopia.   Beginning at the C6-C7 level there is increased T2 signal in the central spinal cord.  This is maximal in the cervical and visualized thoracic spine at the C7-T1 level. No cord expansion occurs.  I suspect the abnormality continues into the upper thoracic spine below the T3 level.   Heterogeneity of marrow signal in the C2 vertebral body is most suggestive of benign hemangioma.  Cervical vertebral height and alignment preserved. No acute osseous abnormality identified.   Visualized paraspinal soft tissues are within normal limits.   C2-C3:  Moderate to severe facet hypertrophy worse on the right. No significant stenosis.   C3-C4:  Probable ankylosis of the right facet joint.  No stenosis.   C4-C5:  Severe right facet hypertrophy.  Borderline to mild right C5 foraminal stenosis.   C5-C6:  Bilateral facet and uncovertebral hypertrophy.  Moderate bilateral C6 foraminal stenosis.   C6-C7:  Circumferential disc osteophyte complex.  Bilateral facet hypertrophy.  No spinal stenosis.  Moderate bilateral C7 foraminal stenosis.   C7-T1:  Severe facet hypertrophy on the left.  Moderate left C8 foraminal stenosis.   T1-T2:  Moderate to severe left facet hypertrophy.  Mild to moderate left T1 foraminal stenosis.   Facet hypertrophy continues into the other visualized upper thoracic levels.   IMPRESSION: 1.  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 No etiology identified.  Recommend follow- up postcontrast cervical MRI imaging and a thoracic spine MRI without and with contrast. 2. Cervical degenerative changes, predominately facet arthropathy. No spinal stenosis.  Multilevel mild to moderate cervical and upper thoracic foraminal stenosis as above. 3.  Lumbar findings are below.   MRI LUMBAR SPINE WITHOUT CONTRAST   Technique: Multiplanar and multiecho pulse sequences of the lumbar spine were obtained without intravenous contrast.     Findings:   Transitional lumbosacral anatomy.  For the purposes of this report, vestigial S1-S2 disc space will be designated. Correlation with radiographs is recommended prior to any operative intervention.   There is abnormal increased T2 signal in the lower thoracic spinal cord to the T12 level.  The conus medullaris is unaffected and appears normal at L1-L2.  Cauda equina nerve roots are within normal limits.   Vertebral height and alignment are normal in the lumbar spine. There is a benign hemangioma of the L2 vertebral body.  There is a small acute or subacute Schmorl's node of the superior L5 endplate. There also appears to be a small vertebral hemangioma in the lower sacrum on the left.   Multiple circumscribed T2 hyperintense lesions in the liver and kidneys.  O several liver lesions measure at least 4 cm in diameter and are incompletely visualized.  Other visualized abdominal viscera are within normal limits.  The bladder is distended. Visualized paraspinal soft tissues are within normal limits.   T12-L1:  Mild circumferential disc bulge.   L1-L2:  Mild circumferential disc bulge.   L2-L3:  Mild circumferential disc bulge.   L3-L4:  Negative.   L4-L5:  Negative.   L5-S1:  Minimal disc bulge.  Moderate to severe facet hypertrophy. Moderate ligament flavum hypertrophy.  No spinal stenosis or convincing neural impingement.   IMPRESSION: 1.  Lower thoracic  spinal cord syringohydromyelia.  See recommendations in #1 on the cervical study. 2.  Mild for age lumbar degenerative changes.  No lumbar spinal stenosis or convincing neural impingement. 3.  Polycystic disease of the liver and kidneys, incompletely characterized.  Physicians involved in your care Any changes since last visit?  no Primary care Larey Dresser   Family History  Problem Relation Age of Onset  . Hyperlipidemia Mother   . Hypertension Mother   . Hyperlipidemia Father   . Hypertension  Father   . Heart disease Sister   . Stroke Neg Hx   . Diabetes Neg Hx    History   Social History  . Marital Status: Widowed    Spouse Name: N/A    Number of Children: N/A  . Years of Education: N/A   Social History Main Topics  . Smoking status: Former Smoker    Types: Cigarettes    Quit date: 06/20/2013  . Smokeless tobacco: None     Comment: electronic cig  . Alcohol Use: No  . Drug Use: No  . Sexual Activity: Not Currently   Other Topics Concern  . None   Social History Narrative  . None   Past Surgical History  Procedure Laterality Date  . Appendectomy  1966  . Abdominal hysterectomy  1988  . Breast lumpectomy      right breat  . Ptca  2007  . Rectal stapling  2008    Done by Dr. Marlou Starks  . Carpal tunnel release  1991, 1993    3 operations in right hand and 2 operations in left hand. Dr. Redmond Pulling in [place]  . Shoulder surgery  1991    Right shoulder surgery in 1991 and left shoulder surgery in 1992 by Dr. Redmond Pulling in [place].  . Bladder suspension  1997  . Rectal surgery  01/07/2007    Dr. Marlou Starks in [place].    Past Medical History  Diagnosis Date  . Hypertension   . HLD (hyperlipidemia)   . GERD (gastroesophageal reflux disease)   . COPD (chronic obstructive pulmonary disease)   . Hemorrhoids   . CAD (coronary artery disease) 2007    nonobstructive, 30% LAD 02/2006  . Fibroadenoma 2008    right breast - based on [place] 09/2006  . HYSTERECTOMY, HX OF 07/10/2006    Qualifier: Diagnosis of  By: Oretha Ellis    . APPENDECTOMY, HX OF 07/10/2006    Qualifier: Diagnosis of  By: Oretha Ellis     BP 157/69  Pulse 75  Resp 14  Ht 5\' 1"  (1.549 m)  Wt 160 lb (72.576 kg)  BMI 30.25 kg/m2  SpO2 97%  LMP 06/20/1986  Opioid Risk Score: 0 Fall Risk Score: Moderate Fall Risk (6-13 points) (educated and handout given for fall prevention inthe home) Review of Systems  Constitutional: Positive for unexpected weight change.  Gastrointestinal: Positive for  diarrhea and constipation.  Genitourinary: Positive for difficulty urinating.  Musculoskeletal: Positive for back pain and gait problem.  Neurological: Positive for dizziness and weakness.  All other systems reviewed and are negative.      Objective:   Physical Exam  Neurological:  Reflex Scores:      Tricep reflexes are 1+ on the right side and 1+ on the left side.      Bicep reflexes are 1+ on the right side and 1+ on the left side.      Brachioradialis reflexes are 1+ on the right side and 1+ on the left  side.      Patellar reflexes are 3+ on the right side and 3+ on the left side.      Achilles reflexes are 1+ on the right side and 1+ on the left side. Decreased sensation pinprick bilateral S1 otherwise OK   Motor strength is 5/5 bilateral deltoid, bicep, tricep, grip, hip flexor, knee extensors, ankle dorsiflexor and plantar flexor Back has no tenderness to palpation in the lumbar and thoracic or cervical paraspinals Cervical range of motion is normal Thoracic range of motion is reduced Lumbar range of motion reduced flexion-extension lateral bending. Has pain with lateral bending as well as with extension. Straight leg raising test is negative Hip knee ankle range of motion are normal. Has pain with palpation anterior ankle area Mood and affect are appropriate Gait shows no evidence of toe drag or knee instability, able to ambulate with a purse and a bag       Assessment & Plan:  1. Deconditioning related to lumbar spondylosis as well as cervical stenosis. She has reduced her activity level secondary to discomfort in these areas. She was quite active up until about 4 years ago walking daily food store to buy groceries No significant neurologic findings. No need for new imaging studies Recommend physical therapy to increase activity level Discussed lumbar injections should physical therapy not be particularly helpful. This would consist of lumbar medial branch blocks L4 and  L5 dorsal ramus injection  Patient has hydrocodone which she takes very intermittently perhaps 2 tablets per month. May take over the counter medications in addition at needed.  Return to clinic one month

## 2013-10-22 NOTE — Patient Instructions (Signed)
Start PT 

## 2013-10-23 ENCOUNTER — Other Ambulatory Visit: Payer: Self-pay | Admitting: Internal Medicine

## 2013-10-24 ENCOUNTER — Other Ambulatory Visit: Payer: Self-pay | Admitting: *Deleted

## 2013-10-24 DIAGNOSIS — K5900 Constipation, unspecified: Secondary | ICD-10-CM

## 2013-10-25 ENCOUNTER — Encounter: Payer: Medicare Other | Admitting: Internal Medicine

## 2013-10-28 MED ORDER — SENNOSIDES-DOCUSATE SODIUM 8.6-50 MG PO TABS: 2.0000 | ORAL_TABLET | Freq: Every day | ORAL | Status: DC

## 2013-10-28 NOTE — Progress Notes (Signed)
Urine drug screen from 10/22/2013 was consistent. 

## 2013-10-28 NOTE — Telephone Encounter (Signed)
Patient can buy ASA over the counter.  Thanks

## 2013-11-05 ENCOUNTER — Ambulatory Visit: Payer: Medicare Other | Attending: Physical Medicine & Rehabilitation | Admitting: Physical Therapy

## 2013-11-05 DIAGNOSIS — M2560 Stiffness of unspecified joint, not elsewhere classified: Secondary | ICD-10-CM | POA: Diagnosis not present

## 2013-11-05 DIAGNOSIS — IMO0001 Reserved for inherently not codable concepts without codable children: Secondary | ICD-10-CM | POA: Insufficient documentation

## 2013-11-05 DIAGNOSIS — M2550 Pain in unspecified joint: Secondary | ICD-10-CM | POA: Insufficient documentation

## 2013-11-06 ENCOUNTER — Encounter: Payer: Self-pay | Admitting: Internal Medicine

## 2013-11-06 ENCOUNTER — Ambulatory Visit (INDEPENDENT_AMBULATORY_CARE_PROVIDER_SITE_OTHER): Payer: Medicare Other | Admitting: Internal Medicine

## 2013-11-06 VITALS — BP 137/87 | HR 62 | Temp 96.8°F | Ht 62.0 in | Wt 159.3 lb

## 2013-11-06 DIAGNOSIS — G894 Chronic pain syndrome: Secondary | ICD-10-CM

## 2013-11-06 DIAGNOSIS — I2510 Atherosclerotic heart disease of native coronary artery without angina pectoris: Secondary | ICD-10-CM | POA: Diagnosis not present

## 2013-11-06 DIAGNOSIS — I10 Essential (primary) hypertension: Secondary | ICD-10-CM

## 2013-11-06 DIAGNOSIS — M810 Age-related osteoporosis without current pathological fracture: Secondary | ICD-10-CM

## 2013-11-06 MED ORDER — HYDROCODONE-ACETAMINOPHEN 5-325 MG PO TABS: 1.0000 | ORAL_TABLET | Freq: Four times a day (QID) | ORAL | Status: DC | PRN

## 2013-11-06 MED ORDER — CARVEDILOL 12.5 MG PO TABS
12.5000 mg | ORAL_TABLET | Freq: Two times a day (BID) | ORAL | Status: DC
Start: 2013-11-06 — End: 2013-12-19

## 2013-11-06 MED ORDER — NITROGLYCERIN 0.4 MG SL SUBL: 0.4000 mg | SUBLINGUAL_TABLET | SUBLINGUAL | Status: DC

## 2013-11-06 NOTE — Patient Instructions (Addendum)
Follow-up in 3-6 months.    Keep taking all of your medicines as prescribed and keep up the good work with physical therapy and pain clinic!   We refilled your Coreg, Vicodin, and nitro tabs (since they were expired) today.   We also ordered a Dexa bone scan, they will call you to set up the appointment time.   Chronic Pain Chronic pain can be defined as pain that is off and on and lasts for 3 6 months or longer. Many things cause chronic pain, which can make it difficult to make a diagnosis. There are many treatment options available for chronic pain. However, finding a treatment that works well for you may require trying various approaches until the right one is found. Many people benefit from a combination of two or more types of treatment to control their pain. SYMPTOMS  Chronic pain can occur anywhere in the body and can range from mild to very severe. Some types of chronic pain include:  Headache.  Low back pain.  Cancer pain.  Arthritis pain.  Neurogenic pain. This is pain resulting from damage to nerves. People with chronic pain may also have other symptoms such as:  Depression.  Anger.  Insomnia.  Anxiety. DIAGNOSIS  Your health care provider will help diagnose your condition over time. In many cases, the initial focus will be on excluding possible conditions that could be causing the pain. Depending on your symptoms, your health care provider may order tests to diagnose your condition. Some of these tests may include:   Blood tests.   CT scan.   MRI.   X-rays.   Ultrasounds.   Nerve conduction studies.  You may need to see a specialist.  TREATMENT  Finding treatment that works well may take time. You may be referred to a pain specialist. He or she may prescribe medicine or therapies, such as:   Mindful meditation or yoga.  Shots (injections) of numbing or pain-relieving medicines into the spine or area of pain.  Local electrical  stimulation.  Acupuncture.   Massage therapy.   Aroma, color, light, or sound therapy.   Biofeedback.   Working with a physical therapist to keep from getting stiff.   Regular, gentle exercise.   Cognitive or behavioral therapy.   Group support.  Sometimes, surgery may be recommended.  HOME CARE INSTRUCTIONS   Take all medicines as directed by your health care provider.   Lessen stress in your life by relaxing and doing things such as listening to calming music.   Exercise or be active as directed by your health care provider.   Eat a healthy diet and include things such as vegetables, fruits, fish, and lean meats in your diet.   Keep all follow-up appointments with your health care provider.   Attend a support group with others suffering from chronic pain. SEEK MEDICAL CARE IF:   Your pain gets worse.   You develop a new pain that was not there before.   You cannot tolerate medicines given to you by your health care provider.   You have new symptoms since your last visit with your health care provider.  SEEK IMMEDIATE MEDICAL CARE IF:   You feel weak.   You have decreased sensation or numbness.   You lose control of bowel or bladder function.   Your pain suddenly gets much worse.   You develop shaking.  You develop chills.  You develop confusion.  You develop chest pain.  You develop shortness of breath.  MAKE SURE YOU:  Understand these instructions.  Will watch your condition.  Will get help right away if you are not doing well or get worse. Document Released: 02/26/2002 Document Revised: 02/06/2013 Document Reviewed: 11/30/2012 [facility] Patient Information 2014 [place].

## 2013-11-06 NOTE — Assessment & Plan Note (Signed)
Patient still having chronic pain, following with Dr. Letta Pate and PT; has PT again tomorrow and appt with Dr. Letta Pate first week in June.  Refilled Vicodin 5-325 mg #45 today.

## 2013-11-06 NOTE — Progress Notes (Signed)
Patient ID: Alexis Kline, female   DOB: 07-12-1945, 68 y.o.   MRN: [id number]   Subjective:   Patient ID: Alexis Kline female   DOB: 22-Dec-1945 68 y.o.   MRN: [id number]  HPI: Alexis Kline is a 68 y.o. woman with history of HTN, HL, CAD, mild COPD, IBS, chronic pain who presents for routine follow-up.   Patient states she is doing well but continues to have chronic "all over" pain.  She did see Dr. Letta Pate earlier this month who referred her to PT.  She went to PT yesterday and has an appointment again tomorrow; states that she had more pain after therapy but thinks it will help in the long run so will continue to go.  She continues to be uninterested in steroid injections.  She would like a prescription for Vicodin refill today though she still has 12 pills remaining from the 06/2013 prescription (#45).  BP 137/87 today.  Patient reports good compliance with all of her medications.  She has chronic headaches but no nausea or dizziness.   Patient remains very active (walks to all of her appointments), no chest pain with or without exertion recently, no shortness of breath.  She does not smoke.  She is requesting a refill on NTG today as her current bottle (which she has not used any of) is expired.   Discussed obtaining a Dexa scan for osteoporosis screening, and patient is amenable.    Past Medical History  Diagnosis Date  . Hypertension   . HLD (hyperlipidemia)   . GERD (gastroesophageal reflux disease)   . COPD (chronic obstructive pulmonary disease)   . Hemorrhoids   . CAD (coronary artery disease) 2007    nonobstructive, 30% LAD 02/2006  . Fibroadenoma 2008    right breast - based on [place] 09/2006  . HYSTERECTOMY, HX OF 07/10/2006    Qualifier: Diagnosis of  By: Oretha Ellis    . APPENDECTOMY, HX OF 07/10/2006    Qualifier: Diagnosis of  By: Oretha Ellis     Current Outpatient Prescriptions  Medication Sig Dispense Refill  . aspirin 81 MG EC  tablet Take 1 tablet (81 mg total) by mouth daily.  30 tablet  5  . B Complex-Biotin-FA (SUPER B-COMPLEX PO) Take 1 tablet by mouth daily.        . carvedilol (COREG) 12.5 MG tablet Take 1 tablet (12.5 mg total) by mouth 2 (two) times daily with a meal.  180 tablet  0  . hydrochlorothiazide (HYDRODIURIL) 25 MG tablet Take 1 tablet (25 mg total) by mouth daily.  30 tablet  11  . HYDROcodone-acetaminophen (NORCO/VICODIN) 5-325 MG per tablet Take 1 tablet by mouth every 6 (six) hours as needed.  45 tablet  0  . lidocaine (LIDODERM) 5 % Place 1 patch onto the skin daily. Remove & Discard patch within 12 hours or as directed by MD  30 patch  0  . naproxen (NAPROSYN) 500 MG tablet Take 1 tablet (500 mg total) by mouth 2 (two) times daily with a meal.  60 tablet  2  . omeprazole (PRILOSEC) 40 MG capsule TAKE (1) CAPSULE DAILY.  30 capsule  1  . pravastatin (PRAVACHOL) 40 MG tablet TAKE ONE TABLET AT BEDTIME.  30 tablet  1  . senna-docusate (SENOKOT-S) 8.6-50 MG per tablet Take 2 tablets by mouth daily.  60 tablet  0  . nitroGLYCERIN (NITROSTAT) 0.4 MG SL tablet Place 1 tablet (0.4 mg total) under the tongue every  5 (five) minutes. up to 3 times as needed for chest pain  30 tablet  0   No current facility-administered medications for this visit.   Family History  Problem Relation Age of Onset  . Hyperlipidemia Mother   . Hypertension Mother   . Hyperlipidemia Father   . Hypertension Father   . Heart disease Sister   . Stroke Neg Hx   . Diabetes Neg Hx    History   Social History  . Marital Status: Widowed    Spouse Name: N/A    Number of Children: N/A  . Years of Education: N/A   Social History Main Topics  . Smoking status: Former Smoker    Types: Cigarettes    Quit date: 06/20/2013  . Smokeless tobacco: None     Comment: electronic cig  . Alcohol Use: No  . Drug Use: No  . Sexual Activity: Not Currently   Other Topics Concern  . None   Social History Narrative  . None    Review of Systems: Review of Systems  Constitutional: Negative for fever and weight loss.  HENT:       Chronic  Respiratory: Negative for cough and shortness of breath.   Cardiovascular: Negative for chest pain and leg swelling.  Gastrointestinal: Negative for nausea, vomiting, abdominal pain, diarrhea and constipation.  Genitourinary: Negative for dysuria.  Musculoskeletal: Positive for back pain, joint pain and neck pain. Negative for falls.  Neurological: Positive for headaches. Negative for dizziness, loss of consciousness and weakness.    Objective:  Physical Exam: Filed Vitals:   11/06/13 1348  BP: 137/87  Pulse: 62  Temp: 96.8 F (36 C)  TempSrc: Oral  Height: 5\' 2"  (1.575 m)  Weight: 159 lb 4.8 oz (72.258 kg)  SpO2: 90%   General: alert, cooperative, and in no apparent distress HEENT: NCAT, vision grossly intact, oropharynx clear and non-erythematous  Neck: supple, no lymphadenopathy Lungs: clear to ascultation bilaterally, normal work of respiration, no wheezes, rales, ronchi Heart: regular rate and rhythm, no murmurs, gallops, or rubs Abdomen: soft, non-tender, non-distended, normal bowel sounds Extremities: 2+ DP/PT pulses bilaterally, no cyanosis, clubbing, or edema Neurologic: alert & oriented X3, cranial nerves II-XII intact, strength grossly intact, sensation intact to light touch  Assessment & Plan:  Patient discussed with Dr. Dareen Piano.  Please see problem-based assessment and plan.

## 2013-11-06 NOTE — Assessment & Plan Note (Signed)
Stable, no chest pain or NTG use in years.  Continue ASA, Coreg.  Refilled NTG today as patient's current bottle expired.

## 2013-11-06 NOTE — Assessment & Plan Note (Signed)
BP Readings from Last 3 Encounters:  11/06/13 137/87  10/22/13 157/69  07/19/13 138/90    Lab Results  Component Value Date   NA 140 12/19/2012   K 4.2 12/19/2012   CREATININE 0.82 12/19/2012    Assessment: Blood pressure control:  at goal Progress toward BP goal:   improved  Plan: Medications:  continue current medications- HCTZ, Coreg

## 2013-11-07 ENCOUNTER — Ambulatory Visit: Payer: Medicare Other

## 2013-11-07 DIAGNOSIS — M2550 Pain in unspecified joint: Secondary | ICD-10-CM | POA: Diagnosis not present

## 2013-11-07 DIAGNOSIS — M2560 Stiffness of unspecified joint, not elsewhere classified: Secondary | ICD-10-CM | POA: Diagnosis not present

## 2013-11-07 DIAGNOSIS — IMO0001 Reserved for inherently not codable concepts without codable children: Secondary | ICD-10-CM | POA: Diagnosis not present

## 2013-11-07 NOTE — Progress Notes (Signed)
INTERNAL MEDICINE TEACHING ATTENDING ADDENDUM - Aldine Contes, MD: I reviewed and discussed with the resident Dr. Stann Mainland, the patient's medical history, physical examination, diagnosis and results of pertinent tests and treatment and I agree with the patient's care as documented.

## 2013-11-19 ENCOUNTER — Encounter: Payer: Self-pay | Admitting: *Deleted

## 2013-11-19 ENCOUNTER — Ambulatory Visit: Payer: Medicare Other | Attending: Internal Medicine | Admitting: Physical Therapy

## 2013-11-19 ENCOUNTER — Telehealth: Payer: Self-pay | Admitting: *Deleted

## 2013-11-19 DIAGNOSIS — M4802 Spinal stenosis, cervical region: Secondary | ICD-10-CM | POA: Insufficient documentation

## 2013-11-19 DIAGNOSIS — M2550 Pain in unspecified joint: Secondary | ICD-10-CM | POA: Diagnosis not present

## 2013-11-19 DIAGNOSIS — Z5189 Encounter for other specified aftercare: Secondary | ICD-10-CM | POA: Insufficient documentation

## 2013-11-19 DIAGNOSIS — I2510 Atherosclerotic heart disease of native coronary artery without angina pectoris: Secondary | ICD-10-CM | POA: Insufficient documentation

## 2013-11-19 DIAGNOSIS — J4489 Other specified chronic obstructive pulmonary disease: Secondary | ICD-10-CM | POA: Diagnosis not present

## 2013-11-19 DIAGNOSIS — M549 Dorsalgia, unspecified: Secondary | ICD-10-CM | POA: Insufficient documentation

## 2013-11-19 DIAGNOSIS — I10 Essential (primary) hypertension: Secondary | ICD-10-CM | POA: Diagnosis not present

## 2013-11-19 DIAGNOSIS — M2560 Stiffness of unspecified joint, not elsewhere classified: Secondary | ICD-10-CM | POA: Diagnosis not present

## 2013-11-19 DIAGNOSIS — J449 Chronic obstructive pulmonary disease, unspecified: Secondary | ICD-10-CM | POA: Insufficient documentation

## 2013-11-19 DIAGNOSIS — M47817 Spondylosis without myelopathy or radiculopathy, lumbosacral region: Secondary | ICD-10-CM | POA: Insufficient documentation

## 2013-11-19 NOTE — Telephone Encounter (Signed)
Advised patient she should bring a list of medications to every appointment.

## 2013-11-19 NOTE — Telephone Encounter (Signed)
Calling to verify if she needs to bring all her meds to appt or just pain meds to the 11/22/13 appt.

## 2013-11-21 ENCOUNTER — Encounter: Payer: Medicare Other | Admitting: Physical Therapy

## 2013-11-22 ENCOUNTER — Encounter: Payer: Medicare Other | Attending: Physical Medicine & Rehabilitation

## 2013-11-22 ENCOUNTER — Encounter: Payer: Self-pay | Admitting: Physical Medicine & Rehabilitation

## 2013-11-22 ENCOUNTER — Ambulatory Visit (HOSPITAL_BASED_OUTPATIENT_CLINIC_OR_DEPARTMENT_OTHER): Payer: Medicare Other | Admitting: Physical Medicine & Rehabilitation

## 2013-11-22 VITALS — BP 133/81 | HR 64 | Resp 14 | Wt 161.0 lb

## 2013-11-22 DIAGNOSIS — K219 Gastro-esophageal reflux disease without esophagitis: Secondary | ICD-10-CM | POA: Diagnosis not present

## 2013-11-22 DIAGNOSIS — M47817 Spondylosis without myelopathy or radiculopathy, lumbosacral region: Secondary | ICD-10-CM | POA: Insufficient documentation

## 2013-11-22 DIAGNOSIS — I10 Essential (primary) hypertension: Secondary | ICD-10-CM | POA: Diagnosis not present

## 2013-11-22 DIAGNOSIS — M47812 Spondylosis without myelopathy or radiculopathy, cervical region: Secondary | ICD-10-CM | POA: Diagnosis not present

## 2013-11-22 DIAGNOSIS — J449 Chronic obstructive pulmonary disease, unspecified: Secondary | ICD-10-CM | POA: Diagnosis not present

## 2013-11-22 DIAGNOSIS — J4489 Other specified chronic obstructive pulmonary disease: Secondary | ICD-10-CM | POA: Insufficient documentation

## 2013-11-22 DIAGNOSIS — IMO0001 Reserved for inherently not codable concepts without codable children: Secondary | ICD-10-CM

## 2013-11-22 DIAGNOSIS — I2510 Atherosclerotic heart disease of native coronary artery without angina pectoris: Secondary | ICD-10-CM | POA: Insufficient documentation

## 2013-11-22 DIAGNOSIS — M4802 Spinal stenosis, cervical region: Secondary | ICD-10-CM | POA: Insufficient documentation

## 2013-11-22 MED ORDER — CYCLOBENZAPRINE HCL 5 MG PO TABS: 5.0000 mg | ORAL_TABLET | Freq: Every day | ORAL | Status: DC

## 2013-11-22 NOTE — Progress Notes (Signed)
Subjective:    Patient ID: Alexis Kline, female    DOB: June 06, 1946, 68 y.o.   MRN: [id number]  HPI More recently the patient has complained of back pain and neck pain. 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 She did have some postoperative physical therapy in [place] in the early 1990s  Takes hydrocodone 5 mg once or twice a month.  Also takes over-the-counter medication such as ibuprofen  Goals resume walking (used to walk 69miles a day in 2011)  Increase speed of house work, cleaning takes much longer, used to take 2-3 hours  Neck pain is constant Low back pain is intermittent  Continues with outpatient therapy had to Mrs. sessions secondary to migraine headache has had 2 other sessions thus far Pain Inventory Average Pain 8 Pain Right Now 8 My pain is sharp, dull, stabbing and aching  In the last 24 hours, has pain interfered with the following? General activity 8 Relation with others 1 Enjoyment of life 10 What TIME of day is your pain at its worst? all Sleep (in general) Poor  Pain is worse with: walking, bending, sitting and standing Pain improves with: heat/ice Relief from Meds: 3  Mobility walk without assistance how many minutes can you walk? 20-30 ability to climb steps?  no do you drive?  no  Function disabled: date disabled 97 I need assistance with the following:  household duties and shopping  Neuro/Psych weakness trouble walking dizziness  Prior  Studies Any changes since last visit?  no  Physicians involved in your care Any changes since last visit?  no   Family History  Problem Relation Age of Onset  . Hyperlipidemia Mother   . Hypertension Mother   . Hyperlipidemia Father   . Hypertension Father   . Heart disease Sister   . Stroke Neg Hx   . Diabetes Neg Hx    History   Social History  . Marital Status: Widowed    Spouse Name: N/A    Number of Children: N/A  . Years of Education: N/A   Social History Main Topics  . Smoking status: Former Smoker    Types: Cigarettes    Quit date: 06/20/2013  . Smokeless tobacco: None     Comment: electronic cig  . Alcohol Use: No  . Drug Use: No  . Sexual Activity: Not Currently   Other Topics Concern  . None   Social History Narrative  . None   Past Surgical History  Procedure Laterality Date  . Appendectomy  1966  . Abdominal hysterectomy  1988  . Breast lumpectomy      right breat  . Ptca  2007  . Rectal stapling  2008    Done by Dr. Marlou Starks  . Carpal tunnel release  1991, 1993    3 operations in right hand and 2 operations in left hand. Dr. Redmond Pulling in [place]  . Shoulder surgery  1991    Right shoulder surgery in 1991 and left shoulder surgery in 1992 by Dr. Redmond Pulling in  [place].  . Bladder suspension  1997  . Rectal surgery  01/07/2007    Dr. Marlou Starks in [place].    Past Medical History  Diagnosis Date  . Hypertension   . HLD (hyperlipidemia)   . GERD (gastroesophageal reflux disease)   . COPD (chronic obstructive pulmonary disease)   . Hemorrhoids   . CAD (coronary artery disease) 2007    nonobstructive, 30% LAD 02/2006  . Fibroadenoma 2008    right breast - based on [place] 09/2006  . HYSTERECTOMY, HX OF 07/10/2006    Qualifier: Diagnosis of  By: Oretha Ellis    . APPENDECTOMY, HX OF 07/10/2006    Qualifier: Diagnosis of  By: Oretha Ellis     BP 133/81  Pulse 64  Resp 14  Wt 161 lb (73.029 kg)  SpO2 99%  LMP 06/20/1986  Opioid Risk  Score:   Fall Risk Score: Moderate Fall Risk (6-13 points) (educated and handout given previously for fall prevention in the home) Review of Systems  Constitutional: Positive for appetite change and unexpected weight change.  Gastrointestinal: Positive for nausea, abdominal pain, diarrhea and constipation.  Musculoskeletal: Positive for gait problem.  Neurological: Positive for dizziness and weakness.  All other systems reviewed and are negative.      Objective:   Physical Exam  Constitutional: She is oriented to person, place, and time.  Neurological: She is alert and oriented to person, place, and time. She has normal strength. A sensory deficit is present.  Reflex Scores:      Tricep reflexes are 1+ on the right side and 3+ on the left side.      Bicep reflexes are 1+ on the right side and 3+ on the left side.      Brachioradialis reflexes are 1+ on the right side and 3+ on the left side.      Patellar reflexes are 1+ on the right side and 2+ on the left side.      Achilles reflexes are 1+ on the right side and 1+ on the left side. Decreased bilateral S1 pinprick sensation otherwise intact    Tricep reflexes are 1+ on the right side and 1+ on the left side.  Bicep reflexes are 1+ on the right side and 1+ on the left side.  Brachioradialis reflexes are 1+ on the right side and 1+ on the left side.  Patellar reflexes are 3+ on the right side and 3+ on the left side.  Achilles reflexes are 1+ on the right side and 1+ on the left side. Decreased sensation pinprick bilateral S1 otherwise OK   Motor strength is 5/5 bilateral deltoid, bicep, tricep, grip, hip flexor, knee extensors, ankle dorsiflexor and plantar flexor  Back has no tenderness to palpation in the lumbar and thoracic or cervical paraspinals  Cervical range of motion is normal  Thoracic range of motion is reduced  Lumbar range of motion reduced flexion-extension lateral bending. Has pain with lateral bending as well as  with extension.  Straight leg raising test is negative  Hip knee ankle range of motion are normal. Has pain with palpation anterior ankle area  Mood and affect are appropriate  Gait shows no evidence of toe drag or knee instability, able to ambulate with a purse and a bag       Assessment & Plan:  1. Deconditioning related to lumbar spondylosis as well as cervical stenosis. She has reduced her activity level secondary to discomfort in these areas. She was quite  active up until about 4 years ago walking daily food store to buy groceries  No significant neurologic findings.  No need for new imaging studies  Recommend physical therapy to increase activity level- has started this tried deep massage for trapezius, cervical traction 12lb  Neck pain mainly left-sided tenderness over the trapezius. Will do trigger point injection today. If not much better would do cervical facet injection at the next visit  Trigger Point Injection  Indication: Left trapezius Myofascial pain not relieved by medication management and other conservative care.  Informed consent was obtained after describing risk and benefits of the procedure with the patient, this includes bleeding, bruising, infection and medication side effects.  The patient wishes to proceed and has given written consent.  The patient was placed in a seated position.  The Left upper trap area was marked and prepped with Betadine x 3 points.  It was entered with a 25-gauge 1-1/2 inch needle and 1 mL of 1% lidocaine was injected into each of 3 trigger points, after negative draw back for blood.  The patient tolerated the procedure well.  Post procedure instructions were given. Discussed lumbar injections should physical therapy not be particularly helpful. This would consist of lumbar medial branch blocks L4 and L5 dorsal ramus injection  Patient has hydrocodone which she takes very intermittently perhaps 2 tablets per month. May take over the counter  medications in addition at needed.

## 2013-11-22 NOTE — Patient Instructions (Signed)
Will do trigger point injection today  If neck is still very sore after finishing out physical therapy will do cervical facet injection C4 C5-C6

## 2013-11-25 ENCOUNTER — Telehealth: Payer: Self-pay

## 2013-11-25 NOTE — Telephone Encounter (Signed)
Questionnaire: Questionnaire  Question: How are you feeling after your recent visit? Answer:   A little bit better.  Question: Does the recommended course of treatment seem to be helping your symptoms? Answer:   A little but not a lot.  Question: Are you experiencing any side effects from your recommended treatment? Answer:   My left arm and shoulder feels heavy as if I'm holding a weight and getting more headaches that don't want to stop.  Question: is there anything else you would like to ask your physician? Answer:   Will these shots really help and for how long?

## 2013-11-28 ENCOUNTER — Encounter: Payer: Self-pay | Admitting: Physical Medicine & Rehabilitation

## 2013-12-03 ENCOUNTER — Encounter: Payer: Medicare Other | Admitting: Physical Therapy

## 2013-12-05 ENCOUNTER — Encounter: Payer: Medicare Other | Admitting: Physical Therapy

## 2013-12-19 ENCOUNTER — Telehealth: Payer: Self-pay | Admitting: Internal Medicine

## 2013-12-19 ENCOUNTER — Other Ambulatory Visit: Payer: Self-pay | Admitting: Internal Medicine

## 2013-12-19 DIAGNOSIS — I10 Essential (primary) hypertension: Secondary | ICD-10-CM

## 2013-12-19 MED ORDER — HYDROCHLOROTHIAZIDE 25 MG PO TABS: 25.0000 mg | ORAL_TABLET | Freq: Every day | ORAL | Status: DC

## 2013-12-26 ENCOUNTER — Other Ambulatory Visit: Payer: Self-pay | Admitting: Internal Medicine

## 2013-12-26 DIAGNOSIS — Z1231 Encounter for screening mammogram for malignant neoplasm of breast: Secondary | ICD-10-CM

## 2013-12-31 ENCOUNTER — Encounter: Payer: Self-pay | Admitting: Internal Medicine

## 2014-01-01 ENCOUNTER — Telehealth: Payer: Self-pay | Admitting: Internal Medicine

## 2014-01-01 ENCOUNTER — Ambulatory Visit (INDEPENDENT_AMBULATORY_CARE_PROVIDER_SITE_OTHER): Payer: Medicare Other | Admitting: Internal Medicine

## 2014-01-01 ENCOUNTER — Encounter: Payer: Self-pay | Admitting: Internal Medicine

## 2014-01-01 ENCOUNTER — Ambulatory Visit (HOSPITAL_COMMUNITY)
Admission: RE | Admit: 2014-01-01 | Discharge: 2014-01-01 | Disposition: A | Discharge status: Home / Self Care | Payer: Medicare Other | Source: Ambulatory Visit | Attending: Internal Medicine | Admitting: Internal Medicine

## 2014-01-01 VITALS — BP 145/79 | HR 78 | Temp 97.3°F | Ht 62.0 in | Wt 163.7 lb

## 2014-01-01 DIAGNOSIS — R0781 Pleurodynia: Secondary | ICD-10-CM | POA: Insufficient documentation

## 2014-01-01 DIAGNOSIS — R079 Chest pain, unspecified: Secondary | ICD-10-CM | POA: Diagnosis present

## 2014-01-01 DIAGNOSIS — E785 Hyperlipidemia, unspecified: Secondary | ICD-10-CM | POA: Diagnosis not present

## 2014-01-01 DIAGNOSIS — I10 Essential (primary) hypertension: Secondary | ICD-10-CM | POA: Diagnosis not present

## 2014-01-01 DIAGNOSIS — J40 Bronchitis, not specified as acute or chronic: Secondary | ICD-10-CM | POA: Diagnosis not present

## 2014-01-01 HISTORY — DX: Pleurodynia: R07.81

## 2014-01-01 LAB — LIPID PANEL
Cholesterol: 178 mg/dL (ref 0–200)
HDL: 47 mg/dL (ref >39)
LDL Cholesterol: 93 mg/dL (ref 0–99)
Total CHOL/HDL Ratio: 3.8 Ratio
Triglycerides: 189 mg/dL — ABNORMAL HIGH (ref <150)
VLDL: 38 mg/dL (ref 0–40)

## 2014-01-01 LAB — BASIC METABOLIC PANEL
BUN: 10 mg/dL (ref 6–23)
CO2: 28 mEq/L (ref 19–32)
Calcium: 9.3 mg/dL (ref 8.4–10.5)
Chloride: 103 mEq/L (ref 96–112)
Creat: 0.77 mg/dL (ref 0.50–1.10)
Glucose, Bld: 89 mg/dL (ref 70–99)
Potassium: 3.6 mEq/L (ref 3.5–5.3)
Sodium: 138 mEq/L (ref 135–145)

## 2014-01-01 LAB — D-DIMER, QUANTITATIVE: D-Dimer, Quant: 1.22 ug/mL-FEU — ABNORMAL HIGH (ref 0.00–0.48)

## 2014-01-01 NOTE — Telephone Encounter (Signed)
I left a patient a voicemail about her upcoming CT scan tomorrow scheduled for 11am and told her to not take anything by food or water starting at [place]. I also told her we will review results together at 130pm in clinic.

## 2014-01-01 NOTE — Patient Instructions (Addendum)
We will schedule your CT scan within the next 1-2 days, so please return for the scan, and we will schedule your f/u appointment accordingly.   Should your pain worsen or you have trouble breathing, please call the clinic.   Thank you.

## 2014-01-01 NOTE — Progress Notes (Signed)
   Subjective:    Patient ID: Alexis Kline, female    DOB: [birth year], 68 y.o.   MRN: [id number]  HPI Alexis Kline is an 68 year old female with a history of HTN, hearing loss, CAD, mild COPD, IBS, chronic pain who presents today for 4-5 months of her "lung catching" in her back.  She describes that her pain is a "clawing" sensation located at the bases of her right lung and has worsened over the past month.  She notes that as a child, she would get pains such that her mom would put a band around her and smear skunk grease at the area which hurt and that seemed to help.  Better with hot pad, hot shower. No improvement with aspirin, acetaminophen, hydrocodone/apap. Worse with any activity and deep breathing. Denies fever, cough, travel, lifting heavy weights but does feel short of breath while walking. Notes smoking for 54 years (chewing tobacco, 3-4 cigars/week, cigarettes 20-30 packs/week) and used to work in [employer].    Review of Systems  Constitutional: Negative for fever.  Respiratory: Positive for shortness of breath. Negative for cough.        Pain worse with inspiration  Cardiovascular: Negative for chest pain.  Musculoskeletal: Positive for back pain.  All other systems reviewed and are negative.      Objective:   Physical Exam  Constitutional: She is oriented to person, place, and time. She appears well-developed. No distress.  HENT:  Head: Normocephalic and atraumatic.  Eyes: Conjunctivae are normal. Pupils are equal, round, and reactive to light.  Cardiovascular: Normal rate, regular rhythm and normal heart sounds.  Exam reveals no gallop and no friction rub.   No murmur heard. Pulmonary/Chest: Effort normal and breath sounds normal. No respiratory distress. She has no wheezes. She has no rales.  Musculoskeletal: She exhibits no edema.  No tenderness to palpation in the location of her pain which is on the right side just inferior to her bra strap.     Neurological: She is alert and oriented to person, place, and time.  Skin: Skin is warm and dry. She is not diaphoretic.  Psychiatric: She has a normal mood and affect. Her behavior is normal.          Assessment & Plan:

## 2014-01-01 NOTE — Assessment & Plan Note (Signed)
Rechecked her lipid panel today since we decided to recheck a D-dimer for her next PCP visit

## 2014-01-01 NOTE — Assessment & Plan Note (Addendum)
Assessment -Differential for her pain includes muscle spasm (possible given improvement with warm compresses, chronic, history of back pain though less likely given lack of response to analgesics and worse with inspiration), lung mass (possible given extensive smoking history but less likely in absence of constitutional symptoms), lung abscess (less likely in the absence of constitutional symptoms but possible given the nature of her pain), pulmonary embolism (less likely given duration of pain, mobilization, no recent travel) -CXR unremarkable for any acute process -D-dimer 1.22 (2.5x upper limit of normal) so cannot exclude any systemic process especially given the duration of her pain  Plan -Scheduled patient for chest CT with contrast tomorrow morning; assessed her iodine allergy but reaction is not life-threatening (just skin discoloration) -Ordered BMET for BUN/Crt -Scheduled f/u appointment after imaging study tor review results with her

## 2014-01-02 ENCOUNTER — Telehealth: Payer: Self-pay | Admitting: Internal Medicine

## 2014-01-02 ENCOUNTER — Ambulatory Visit (HOSPITAL_COMMUNITY)
Admission: RE | Admit: 2014-01-02 | Discharge: 2014-01-02 | Disposition: A | Discharge status: Home / Self Care | Payer: Medicare Other | Source: Ambulatory Visit | Attending: Internal Medicine | Admitting: Internal Medicine

## 2014-01-02 ENCOUNTER — Ambulatory Visit (INDEPENDENT_AMBULATORY_CARE_PROVIDER_SITE_OTHER): Payer: Medicare Other | Admitting: Internal Medicine

## 2014-01-02 ENCOUNTER — Encounter: Payer: Self-pay | Admitting: Internal Medicine

## 2014-01-02 VITALS — BP 126/81 | HR 78 | Temp 97.8°F | Ht 62.0 in | Wt 163.1 lb

## 2014-01-02 DIAGNOSIS — R079 Chest pain, unspecified: Secondary | ICD-10-CM

## 2014-01-02 DIAGNOSIS — Z8611 Personal history of tuberculosis: Secondary | ICD-10-CM | POA: Diagnosis not present

## 2014-01-02 DIAGNOSIS — I10 Essential (primary) hypertension: Secondary | ICD-10-CM | POA: Diagnosis not present

## 2014-01-02 DIAGNOSIS — J8410 Pulmonary fibrosis, unspecified: Secondary | ICD-10-CM | POA: Diagnosis not present

## 2014-01-02 LAB — CBC
HCT: 35.7 % — ABNORMAL LOW (ref 36.0–46.0)
Hemoglobin: 11.4 g/dL — ABNORMAL LOW (ref 12.0–15.0)
MCH: 22.8 pg — ABNORMAL LOW (ref 26.0–34.0)
MCHC: 31.9 g/dL (ref 30.0–36.0)
MCV: 71.3 fL — ABNORMAL LOW (ref 78.0–100.0)
Platelets: 295 10*3/uL (ref 150–400)
RBC: 5.01 MIL/uL (ref 3.87–5.11)
RDW: 17 % — ABNORMAL HIGH (ref 11.5–15.5)
WBC: 5.7 10*3/uL (ref 4.0–10.5)

## 2014-01-02 LAB — HEPATIC FUNCTION PANEL
ALT: 16 U/L (ref 0–35)
AST: 27 U/L (ref 0–37)
Albumin: 3.8 g/dL (ref 3.5–5.2)
Alkaline Phosphatase: 49 U/L (ref 39–117)
Bilirubin, Direct: 0.1 mg/dL (ref 0.0–0.3)
Indirect Bilirubin: 0.3 mg/dL (ref 0.2–1.2)
Total Bilirubin: 0.4 mg/dL (ref 0.2–1.2)
Total Protein: 6.6 g/dL (ref 6.0–8.3)

## 2014-01-02 MED ORDER — IOHEXOL 300 MG/ML  SOLN
100.0000 mL | Freq: Once | INTRAMUSCULAR | Status: AC | PRN
  Administered 2014-01-02: 100 mL via INTRAVENOUS

## 2014-01-02 NOTE — Assessment & Plan Note (Addendum)
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 -Patient denies constitutional symptoms making ongoing infection less likely  Plan -Order Quantiferon TB: if positive, start 71-month course of isoniazid -Order CBC & LFTs for baseline in case we need to start isoniazid

## 2014-01-02 NOTE — Assessment & Plan Note (Signed)
Assessment -Imaging findings from yesterday unremarkable for acute respiratory process so likely musculoskeletal pain by virtue of exclusion -Patient reluctant to take her Vicodin  Plan -Advise patient to take 1 tablet of Vicodin prior to going to sleep to help with relief at night

## 2014-01-02 NOTE — Telephone Encounter (Signed)
I spoke with the patient just to confirm she received my message from yesterday. She knows she is to come back for the CT scan, to remain NPO until the scan, and to follow-up with me at 1:30pm.

## 2014-01-02 NOTE — Patient Instructions (Addendum)
If your test is positive, we will give you a call within the next 1-2 weeks. Otherwise, you do not have signs of a latent infection.  For your pain, please take 1 tablet of Vicodin prior to going to sleep.  Thank you!

## 2014-01-02 NOTE — Progress Notes (Signed)
INTERNAL MEDICINE TEACHING ATTENDING ADDENDUM - Aldine Contes, MD: I personally saw and evaluated Ms. Hoopingarner in this clinic visit in conjunction with the resident, Dr. Posey Pronto. I have discussed patient's plan of care with medical resident during this visit. I have confirmed the physical exam findings and have read and agree with the clinic note including the plan with the following addition: - Patient with chronic back pain but acutely worsening over the last 2 weeks associated with SOB and pleuritic in nature - On exam : lungs CTA b/l, Cardio- RRR, normal heart sounds, No lower extremity edema - Given elevated d dimer, acutely worsening back pain and assoc sob will need to rule out PE. Will f/u CT chest - Continue with pain control for now - case d/w patient in detail

## 2014-01-02 NOTE — Progress Notes (Signed)
   Subjective:    Patient ID: Alexis Kline, female    DOB: 1945/10/02, 68 y.o.   MRN: [id number]  HPI [name] is an 68 year old female with a history of HTN, hearing loss, CAD, mild COPD, IBS, chronic pain who presents today to review the findings of CTA.  She presented to clinic yesterday for a 4-5 month of pain ("clawing of her lung") that had worsened over the last month. Please see my note from yesterday for further details.  Her CTA today showed no acute PE but a Gohn complex in the upper lobe of her right lung with calcifications and lymphadenopathy indicative of a past TB infection. She denies ever remembering she had TB but has several exposures from the past. Her grandmother died from TB at age 41 when patient was 68 years old. 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 She denies weight loss, night sweats, fever, malaise, hemoptysis.   Review of Systems  Constitutional: Negative for fever, fatigue and unexpected weight change.  Respiratory:       No hemoptysis.  All other systems reviewed and are negative.      Objective:   Physical Exam  Constitutional: She is oriented to person, place, and time. She appears well-developed. No distress.  HENT:  Head: Normocephalic and atraumatic.  Eyes: Conjunctivae are normal. Pupils are equal, round, and reactive to light.  Cardiovascular: Normal rate, regular rhythm and normal heart sounds.   Pulmonary/Chest: Effort normal and breath sounds normal. No respiratory distress.  Neurological: She is alert and oriented to person, place, and time. No cranial nerve deficit.  Skin: She is not diaphoretic.  Psychiatric: She has a normal mood and affect. Her behavior is normal.          Assessment & Plan:

## 2014-01-05 NOTE — Progress Notes (Signed)
INTERNAL MEDICINE TEACHING ATTENDING ADDENDUM - Alexis Contes, MD: I personally saw and evaluated [name] in this clinic visit in conjunction with the resident, Dr. Posey Kline. I have discussed patient's plan of care with medical resident during this visit. I have confirmed the physical exam findings and have read and agree with the clinic note including the plan with the following addition: - Patient with likely Gohn complex on CTA - Will get Quantiferon test- If + will need 9 months of INH - On exam- Lungs- CTA b/l, Cardio- RRR, normal heart sounds - Case d/w patient in detail

## 2014-01-06 LAB — QUANTIFERON TB GOLD ASSAY (BLOOD)
Interferon Gamma Release Assay: NEGATIVE
Mitogen value: 6.83 IU/mL
Quantiferon Nil Value: 0.01 IU/mL
Quantiferon Tb Ag Minus Nil Value: 0 IU/mL
TB Ag value: 0.01 IU/mL

## 2014-01-08 ENCOUNTER — Ambulatory Visit (INDEPENDENT_AMBULATORY_CARE_PROVIDER_SITE_OTHER): Payer: Medicare Other | Admitting: Internal Medicine

## 2014-01-08 ENCOUNTER — Encounter: Payer: Self-pay | Admitting: Internal Medicine

## 2014-01-08 VITALS — BP 130/97 | HR 61 | Ht 62.0 in | Wt 163.9 lb

## 2014-01-08 DIAGNOSIS — T148 Other injury of unspecified body region: Secondary | ICD-10-CM

## 2014-01-08 DIAGNOSIS — I2510 Atherosclerotic heart disease of native coronary artery without angina pectoris: Secondary | ICD-10-CM | POA: Diagnosis not present

## 2014-01-08 DIAGNOSIS — W57XXXA Bitten or stung by nonvenomous insect and other nonvenomous arthropods, initial encounter: Secondary | ICD-10-CM | POA: Diagnosis not present

## 2014-01-08 MED ORDER — DOXYCYCLINE HYCLATE 100 MG PO TABS: 100.0000 mg | ORAL_TABLET | Freq: Two times a day (BID) | ORAL | Status: DC

## 2014-01-08 NOTE — Addendum Note (Signed)
Addended by: Cresenciano Genre on: 01/08/2014 03:02 PM   Modules accepted: Level of Service

## 2014-01-08 NOTE — Assessment & Plan Note (Signed)
Suspected bite from insect ? Right upper arm Appearance looks like erythema migranes ? Tick bite but uncertain, doubt brown recluse, erythema multiforme  Will tx with Doxycycline 100 mg bid x 10 days  RTC in 2 weeks prn otherwise 02/2014

## 2014-01-08 NOTE — Patient Instructions (Addendum)
General Instructions: This could have been a tick bite but I am not sure. We will treat you for suspected tick bite  Follow up in 2 weeks if not resolved otherwise please follow up 02/2014, sooner if needed Take antibiotic 2x per day x 10 days  Treatment Goals:  Goals (1 Years of Data) as of 01/08/14   None      Progress Toward Treatment Goals:  Treatment Goal 01/08/2014  Blood pressure deteriorated  Stop smoking -    Self Care Goals & Plans:  Self Care Goal 01/08/2014  Manage my medications take my medicines as prescribed; bring my medications to every visit; refill my medications on time  Monitor my health keep track of my blood pressure  Eat healthy foods drink diet soda or water instead of juice or soda; eat more vegetables; eat foods that are low in salt; eat baked foods instead of fried foods; eat fruit for snacks and desserts; eat smaller portions  Be physically active find an activity I enjoy  Meeting treatment goals maintain the current self-care plan    No flowsheet data found.   Care Management & Community Referrals:  Referral 01/08/2014  Referrals made to community resources none     Doxycycline tablets or capsules What is this medicine? DOXYCYCLINE (dox i SYE kleen) is a tetracycline antibiotic. It kills certain bacteria or stops their growth. It is used to treat many kinds of infections, like dental, skin, respiratory, and urinary tract infections. It also treats acne, Lyme disease, malaria, and certain sexually transmitted infections. This medicine may be used for other purposes; ask your health care provider or pharmacist if you have questions. COMMON BRAND NAME(S): Acticlate, Adoxa, Adoxa CK, Adoxa Pak, Adoxa TT, Alodox, Avidoxy, Doxal, Monodox, Morgidox 1x, Morgidox 1x Kit, Morgidox 2x, Morgidox 2x Kit, Ocudox, Vibra-Tabs, Vibramycin What should I tell my health care provider before I take this medicine? They need to know if you have any of these  conditions: -liver disease -long exposure to sunlight like working outdoors -stomach problems like colitis -an unusual or allergic reaction to doxycycline, tetracycline antibiotics, other medicines, foods, dyes, or preservatives -pregnant or trying to get pregnant -breast-feeding How should I use this medicine? Take this medicine by mouth with a full glass of water. Follow the directions on the prescription label. It is best to take this medicine without food, but if it upsets your stomach take it with food. Take your medicine at regular intervals. Do not take your medicine more often than directed. Take all of your medicine as directed even if you think you are better. Do not skip doses or stop your medicine early. Talk to your pediatrician regarding the use of this medicine in children. Special care may be needed. While this drug may be prescribed for children as young as 42 years old for selected conditions, precautions do apply. Overdosage: If you think you have taken too much of this medicine contact a poison control center or emergency room at once. NOTE: This medicine is only for you. Do not share this medicine with others. What if I miss a dose? If you miss a dose, take it as soon as you can. If it is almost time for your next dose, take only that dose. Do not take double or extra doses. What may interact with this medicine? -antacids -barbiturates -birth control pills -bismuth subsalicylate -carbamazepine -methoxyflurane -other antibiotics -phenytoin -vitamins that contain iron -warfarin This list may not describe all possible interactions. Give your health care  provider a list of all the medicines, herbs, non-prescription drugs, or dietary supplements you use. Also tell them if you smoke, drink alcohol, or use illegal drugs. Some items may interact with your medicine. What should I watch for while using this medicine? Tell your doctor or health care professional if your symptoms  do not improve. Do not treat diarrhea with over the counter products. Contact your doctor if you have diarrhea that lasts more than 2 days or if it is severe and watery. Do not take this medicine just before going to bed. It may not dissolve properly when you lay down and can cause pain in your throat. Drink plenty of fluids while taking this medicine to also help reduce irritation in your throat. This medicine can make you more sensitive to the sun. Keep out of the sun. If you cannot avoid being in the sun, wear protective clothing and use sunscreen. Do not use sun lamps or tanning beds/booths. Birth control pills may not work properly while you are taking this medicine. Talk to your doctor about using an extra method of birth control. If you are being treated for a sexually transmitted infection, avoid sexual contact until you have finished your treatment. Your sexual partner may also need treatment. Avoid antacids, aluminum, calcium, magnesium, and iron products for 4 hours before and 2 hours after taking a dose of this medicine. If you are using this medicine to prevent malaria, you should still protect yourself from contact with mosquitos. Stay in screened-in areas, use mosquito nets, keep your body covered, and use an insect repellent. What side effects may I notice from receiving this medicine? Side effects that you should report to your doctor or health care professional as soon as possible: -allergic reactions like skin rash, itching or hives, swelling of the face, lips, or tongue -difficulty breathing -fever -itching in the rectal or genital area -pain on swallowing -redness, blistering, peeling or loosening of the skin, including inside the mouth -severe stomach pain or cramps -unusual bleeding or bruising -unusually weak or tired -yellowing of the eyes or skin Side effects that usually do not require medical attention (report to your doctor or health care professional if they continue  or are bothersome): -diarrhea -loss of appetite -nausea, vomiting This list may not describe all possible side effects. Call your doctor for medical advice about side effects. You may report side effects to FDA at 1-800-FDA-1088. Where should I keep my medicine? Keep out of the reach of children. Store at room temperature, below 30 degrees C (86 degrees F). Protect from light. Keep container tightly closed. Throw away any unused medicine after the expiration date. Taking this medicine after the expiration date can make you seriously ill. NOTE: This sheet is a summary. It may not cover all possible information. If you have questions about this medicine, talk to your doctor, pharmacist, or health care provider.  2015, Elsevier/Gold Standard. (2013-04-12 13:58:06)   Tick Bite Information Ticks are insects that attach themselves to the skin and draw blood for food. There are various types of ticks. Common types include wood ticks and deer ticks. Most ticks live in shrubs and grassy areas. Ticks can climb onto your body when you make contact with leaves or grass where the tick is waiting. The most common places on the body for ticks to attach themselves are the scalp, neck, armpits, waist, and groin. Most tick bites are harmless, but sometimes ticks carry germs that cause diseases. These germs can be spread  to a person during the tick's feeding process. The chance of a disease spreading through a tick bite depends on:   The type of tick.  Time of year.   How long the tick is attached.   Geographic location.  HOW CAN YOU PREVENT TICK BITES? Take these steps to help prevent tick bites when you are outdoors:  Wear protective clothing. Long sleeves and long pants are best.   Wear white clothes so you can see ticks more easily.  Tuck your pant legs into your socks.   If walking on a trail, stay in the middle of the trail to avoid brushing against bushes.  Avoid walking through areas  with long grass.  Put insect repellent on all exposed skin and along boot tops, pant legs, and sleeve cuffs.   Check clothing, hair, and skin repeatedly and before going inside.   Brush off any ticks that are not attached.  Take a shower or bath as soon as possible after being outdoors.  WHAT IS THE PROPER WAY TO REMOVE A TICK? Ticks should be removed as soon as possible to help prevent diseases caused by tick bites. 1. If latex gloves are available, put them on before trying to remove a tick.  2. Using fine-point tweezers, grasp the tick as close to the skin as possible. You may also use curved forceps or a tick removal tool. Grasp the tick as close to its head as possible. Avoid grasping the tick on its body. 3. Pull gently with steady upward pressure until the tick lets go. Do not twist the tick or jerk it suddenly. This may break off the tick's head or mouth parts. 4. Do not squeeze or crush the tick's body. This could force disease-carrying fluids from the tick into your body.  5. After the tick is removed, wash the bite area and your hands with soap and water or other disinfectant such as alcohol. 6. Apply a small amount of antiseptic cream or ointment to the bite site.  7. Wash and disinfect any instruments that were used.  Do not try to remove a tick by applying a hot match, petroleum jelly, or fingernail polish to the tick. These methods do not work and may increase the chances of disease being spread from the tick bite.  WHEN SHOULD YOU SEEK MEDICAL CARE? Contact your health care provider if you are unable to remove a tick from your skin or if a part of the tick breaks off and is stuck in the skin.  After a tick bite, you need to be aware of signs and symptoms that could be related to diseases spread by ticks. Contact your health care provider if you develop any of the following in the days or weeks after the tick bite:  Unexplained fever.  Rash. A circular rash that  appears days or weeks after the tick bite may indicate the possibility of Lyme disease. The rash may resemble a target with a bull's-eye and may occur at a different part of your body than the tick bite.  Redness and swelling in the area of the tick bite.   Tender, swollen lymph glands.   Diarrhea.   Weight loss.   Cough.   Fatigue.   Muscle, joint, or bone pain.   Abdominal pain.   Headache.   Lethargy or a change in your level of consciousness.  Difficulty walking or moving your legs.   Numbness in the legs.   Paralysis.  Shortness of breath.  Confusion.   Repeated vomiting.  Document Released: 06/03/2000 Document Revised: 03/27/2013 Document Reviewed: 11/14/2012 [facility] Patient Information 2015 [place], [place]. This information is not intended to replace advice given to you by your health care provider. Make sure you discuss any questions you have with your health care provider.   Insect Bite Mosquitoes, flies, fleas, bedbugs, and many other insects can bite. Insect bites are different from insect stings. A sting is when venom is injected into the skin. Some insect bites can transmit infectious diseases. SYMPTOMS  Insect bites usually turn red, swell, and itch for 2 to 4 days. They often go away on their own. TREATMENT  Your caregiver may prescribe antibiotic medicines if a bacterial infection develops in the bite. HOME CARE INSTRUCTIONS  Do not scratch the bite area.  Keep the bite area clean and dry. Wash the bite area thoroughly with soap and water.  Put ice or cool compresses on the bite area.  Put ice in a plastic bag.  Place a towel between your skin and the bag.  Leave the ice on for 20 minutes, 4 times a day for the first 2 to 3 days, or as directed.  You may apply a baking soda paste, cortisone cream, or calamine lotion to the bite area as directed by your caregiver. This can help reduce itching and swelling.  Only take  over-the-counter or prescription medicines as directed by your caregiver.  If you are given antibiotics, take them as directed. Finish them even if you start to feel better. You may need a tetanus shot if:  You cannot remember when you had your last tetanus shot.  You have never had a tetanus shot.  The injury broke your skin. If you get a tetanus shot, your arm may swell, get red, and feel warm to the touch. This is common and not a problem. If you need a tetanus shot and you choose not to have one, there is a rare chance of getting tetanus. Sickness from tetanus can be serious. SEEK IMMEDIATE MEDICAL CARE IF:   You have increased pain, redness, or swelling in the bite area.  You see a red line on the skin coming from the bite.  You have a fever.  You have joint pain.  You have a headache or neck pain.  You have unusual weakness.  You have a rash.  You have chest pain or shortness of breath.  You have abdominal pain, nausea, or vomiting.  You feel unusually tired or sleepy. MAKE SURE YOU:   Understand these instructions.  Will watch your condition.  Will get help right away if you are not doing well or get worse. Document Released: 07/14/2004 Document Revised: 08/29/2011 Document Reviewed: 01/05/2011 [facility] Patient Information 2015 [place], [place]. This information is not intended to replace advice given to you by your health care provider. Make sure you discuss any questions you have with your health care provider.

## 2014-01-08 NOTE — Progress Notes (Signed)
   Subjective:    Patient ID: Alexis Kline, female    DOB: [birth year], 68 y.o.   MRN: [id number]  HPI Comments: 68 y.o PMH chronic pain, COPD, CAD, history of TB, HLD, HTN, tobacco abuse  She presents for:  1. Area on right upper arm red ? Bite from insect when walking outside on 7/17.  Noted since 7/17 changed in shape and size started out size of fingernail and red but now area is bigger and the red area has turned purple. Lesion is not painful but is hard, denies itching.  She has tried backing soda, glue, alcohol, soap and water, hydrogen peroxide, neosporin.  She has not been exposed to pets and denies fever, chills.       Review of Systems  Skin: Positive for wound.       Objective:   Physical Exam  Nursing note and vitals reviewed. Constitutional: She is oriented to person, place, and time. She appears well-developed and well-nourished. No distress.  HENT:  Head: Normocephalic and atraumatic.  Eyes: Conjunctivae are normal.  Cardiovascular: Normal rate, regular rhythm and normal heart sounds.   Pulmonary/Chest: Effort normal and breath sounds normal.  Neurological: She is alert and oriented to person, place, and time.  Skin:     Psychiatric: She has a normal mood and affect. Her speech is normal and behavior is normal. Judgment and thought content normal. Cognition and memory are normal.          Assessment & Plan:  F/u in 2 weeks prn, then F/u 02/2014

## 2014-01-09 ENCOUNTER — Ambulatory Visit: Payer: Medicare Other | Admitting: Physical Medicine & Rehabilitation

## 2014-01-09 ENCOUNTER — Telehealth: Payer: Self-pay | Admitting: Internal Medicine

## 2014-01-09 NOTE — Telephone Encounter (Signed)
I called the patient today to inform her that she did not test positive for TB and thus did not need to consider 26-month therapy of isoniazid. She was relieved and grateful to hear the news.

## 2014-01-13 NOTE — Addendum Note (Signed)
Addended by: Gilles Chiquito B on: 01/13/2014 02:44 PM   Modules accepted: Level of Service

## 2014-01-13 NOTE — Progress Notes (Signed)
I saw and evaluated the patient.  I personally confirmed the key portions of the history and exam documented by Dr. McLean and I reviewed pertinent patient test results.  The assessment, diagnosis, and plan were formulated together and I agree with the documentation in the resident's note.    

## 2014-01-20 ENCOUNTER — Encounter: Payer: Self-pay | Admitting: Internal Medicine

## 2014-01-21 ENCOUNTER — Encounter: Payer: Self-pay | Admitting: *Deleted

## 2014-01-22 ENCOUNTER — Encounter: Payer: Self-pay | Admitting: Internal Medicine

## 2014-01-24 ENCOUNTER — Ambulatory Visit: Payer: Medicare Other | Admitting: Internal Medicine

## 2014-01-27 ENCOUNTER — Ambulatory Visit: Payer: Medicare Other | Admitting: Internal Medicine

## 2014-01-29 ENCOUNTER — Ambulatory Visit (HOSPITAL_COMMUNITY): Payer: Medicare Other

## 2014-02-13 ENCOUNTER — Ambulatory Visit (HOSPITAL_COMMUNITY): Payer: Medicare Other

## 2014-02-18 ENCOUNTER — Ambulatory Visit: Payer: Medicare Other | Admitting: Internal Medicine

## 2014-02-25 ENCOUNTER — Encounter: Payer: Self-pay | Admitting: Internal Medicine

## 2014-02-25 ENCOUNTER — Ambulatory Visit (INDEPENDENT_AMBULATORY_CARE_PROVIDER_SITE_OTHER): Payer: Medicare Other | Admitting: Internal Medicine

## 2014-02-25 VITALS — BP 141/85 | HR 75 | Temp 98.0°F | Wt 164.0 lb

## 2014-02-25 DIAGNOSIS — I10 Essential (primary) hypertension: Secondary | ICD-10-CM

## 2014-02-25 DIAGNOSIS — Z23 Encounter for immunization: Secondary | ICD-10-CM | POA: Diagnosis not present

## 2014-02-25 DIAGNOSIS — I2510 Atherosclerotic heart disease of native coronary artery without angina pectoris: Secondary | ICD-10-CM | POA: Diagnosis not present

## 2014-02-25 DIAGNOSIS — Z0000 Encounter for general adult medical examination without abnormal findings: Secondary | ICD-10-CM

## 2014-02-25 DIAGNOSIS — E785 Hyperlipidemia, unspecified: Secondary | ICD-10-CM | POA: Diagnosis not present

## 2014-02-25 DIAGNOSIS — W57XXXA Bitten or stung by nonvenomous insect and other nonvenomous arthropods, initial encounter: Secondary | ICD-10-CM

## 2014-02-25 DIAGNOSIS — T148 Other injury of unspecified body region: Secondary | ICD-10-CM

## 2014-02-25 DIAGNOSIS — H547 Unspecified visual loss: Secondary | ICD-10-CM

## 2014-02-25 NOTE — Progress Notes (Signed)
   Subjective:    Patient ID: Alexis Kline, female    DOB: 1945/10/01, 68 y.o.   MRN: [id number]  HPI Alexis Kline is a 68 yo female with PMHx of h/o of TB, HTN, HLD, and chronic pain syndrome who presents today for follow up from a possible tick bit in July.  Patient presented in July 2015 with a 0.2 cm excoriation with a pale halo and erythematous outer circle located on her right upper extremity. She prescribed doxycycline 100 mg BID for 10 days. Patient took medication without issue and states the lesion has improved. She denies any fever, chills, nausea, vomiting, headache, or myalgias or rash.   Review of Systems General: Denies fever, chills, fatigue, change in appetite and diaphoresis.  Respiratory: Denies SOB, cough, DOE, chest tightness, and wheezing.   Cardiovascular: Denies chest pain and palpitations.  Gastrointestinal: Denies nausea, vomiting, abdominal pain, diarrhea, constipation, blood in stool and abdominal distention.  Genitourinary: Denies dysuria, urgency, frequency, hematuria, suprapubic pain and flank pain. Endocrine: Denies hot or cold intolerance, polyuria, and polydipsia. Musculoskeletal: Denies myalgias, back pain, joint swelling, arthralgias and gait problem.  Skin: Denies pallor, rash and wounds.  Neurological: Admits to decreased vision. Denies dizziness, headaches, weakness, lightheadedness, numbness,seizures, and syncope, Psychiatric/Behavioral: Denies mood changes, confusion, nervousness, sleep disturbance and agitation.     Objective:   Physical Exam Filed Vitals:   02/25/14 1413  BP: 141/85  Pulse: 75  Temp: 98 F (36.7 C)  TempSrc: Oral  Weight: 164 lb (74.39 kg)  SpO2: 99%   General: Vital signs reviewed.  Patient is well-developed and well-nourished, in no acute distress and cooperative with exam.  Cardiovascular: RRR, S1 normal, S2 normal, no murmurs, gallops, or rubs. Pulmonary/Chest: Clear to auscultation bilaterally, no wheezes,  rales, or rhonchi. Abdominal: Soft, non-tender, non-distended, BS +, no masses, organomegaly, or guarding present.  Musculoskeletal: No joint deformities, erythema, or stiffness, ROM full and nontender. Extremities: No lower extremity edema bilaterally,  pulses symmetric and intact bilaterally. No cyanosis or clubbing. Skin: Previous lesion on right upper extremity looks well healed. Small, 0.5 cm light pink circumscribed lesion without halo or erythematous outer ring. Warm, dry and intact. No rashes or erythema. Psychiatric: Normal mood and affect. speech and behavior is normal. Cognition and memory are normal.      Assessment & Plan:   Please see problem based assessment and plan.

## 2014-02-25 NOTE — Patient Instructions (Signed)
General Instructions:   Please try to bring all your medicines next time. This will help [place] keep you safe from mistakes.  Please continue taking all of your medications as prescribed.   Progress Toward Treatment Goals:  Treatment Goal 01/08/2014  Blood pressure deteriorated  Stop smoking -    Self Care Goals & Plans:  Self Care Goal 02/25/2014  Manage my medications take my medicines as prescribed; bring my medications to every visit; refill my medications on time  Monitor my health keep track of my blood pressure; bring my blood pressure log to each visit  Eat healthy foods eat baked foods instead of fried foods; eat foods that are low in salt  Be physically active find an activity I enjoy  Prevent falls use home fall prevention checklist to improve safety; have my vision checked  Meeting treatment goals -

## 2014-02-25 NOTE — Addendum Note (Signed)
Addended by: Vickii Chafe on: 02/25/2014 04:51 PM   Modules accepted: Level of Service

## 2014-02-25 NOTE — Assessment & Plan Note (Signed)
BP Readings from Last 3 Encounters:  02/25/14 141/85  01/08/14 130/97  01/02/14 126/81    Lab Results  Component Value Date   NA 138 01/01/2014   K 3.6 01/01/2014   CREATININE 0.77 01/01/2014    Assessment: Blood pressure control:  Controlled Progress toward BP goal:   At goal Comments: Taking carvedilol 12.5 mg BID and HCTZ 25 mg daily without issues  Plan: Medications:  continue current medications Educational resources provided: brochure Self management tools provided: home blood pressure logbook

## 2014-02-25 NOTE — Assessment & Plan Note (Signed)
Assessment: Patient complains of worsening near sighted and far sighted vision over the past few years. She denies any blurry vision.  Plan: Referral to Ophthalmologist.

## 2014-02-25 NOTE — Assessment & Plan Note (Signed)
Flu vaccine 9/8/215  Due for colonoscopy in 2016

## 2014-02-25 NOTE — Assessment & Plan Note (Signed)
Assessment: Last lipid panel in July 2015 showed total cholesterol 178, TG 189, HDL 47, LDL 93.  Plan: I counseled the patient on diet changes. We will continue Pravastatin 40 mg daily.

## 2014-02-25 NOTE — Assessment & Plan Note (Signed)
Assessment: Well healed lesion on right upper extremity after treatment with doxycyline 100 mg BID for 10 days for possible tick bite. Lesion is now 0.5 cm, light pink circumscribed without halo or erythematous border. Patient has no associated symptoms.  Plan: Resolved.

## 2014-02-26 NOTE — Progress Notes (Signed)
Internal Medicine Clinic Attending Date of visit: 02/25/2014  I saw and evaluated the patient.  I personally confirmed the key portions of the history and exam documented by Dr. Marvel Plan and I reviewed pertinent patient test results.  The assessment, diagnosis, and plan were formulated together and I agree with the documentation in the resident's note.

## 2014-03-14 ENCOUNTER — Encounter: Payer: Self-pay | Admitting: Internal Medicine

## 2014-03-19 ENCOUNTER — Other Ambulatory Visit: Payer: Self-pay | Admitting: Internal Medicine

## 2014-04-11 DIAGNOSIS — H2513 Age-related nuclear cataract, bilateral: Secondary | ICD-10-CM | POA: Diagnosis not present

## 2014-04-17 ENCOUNTER — Other Ambulatory Visit: Payer: Self-pay | Admitting: Internal Medicine

## 2014-04-17 DIAGNOSIS — I10 Essential (primary) hypertension: Secondary | ICD-10-CM

## 2014-05-19 ENCOUNTER — Encounter: Payer: Self-pay | Admitting: Internal Medicine

## 2014-05-20 ENCOUNTER — Ambulatory Visit: Payer: Medicare Other | Admitting: Internal Medicine

## 2014-06-27 ENCOUNTER — Other Ambulatory Visit: Payer: Self-pay | Admitting: *Deleted

## 2014-06-28 MED ORDER — ASPIRIN 81 MG PO TBEC: 81.0000 mg | DELAYED_RELEASE_TABLET | Freq: Every day | ORAL | Status: DC

## 2014-07-01 NOTE — Telephone Encounter (Signed)
Rx called in to pharmacy. Hilda Blades Shadara Lopez RN 07/01/14 9AM

## 2014-07-21 ENCOUNTER — Other Ambulatory Visit: Payer: Self-pay | Admitting: Internal Medicine

## 2014-09-03 ENCOUNTER — Encounter: Payer: Medicare Other | Admitting: Internal Medicine

## 2014-09-04 IMAGING — CT CT CHEST W/ CM
2 of 3 series · 15 of 36 positions shown, 18 images · IV contrast (Iodine)
Comparison: Chest radiography same day

CLINICAL DATA: Intermittent right-sided chest pain.

EXAM:
CT CHEST WITH CONTRAST
TECHNIQUE: Multidetector CT imaging of the chest was performed during
intravenous contrast administration.
CONTRAST:  80mL OMNIPAQUE IOHEXOL 300 MG/ML  SOLN

[Series 201: chest with, idose (2) · axial · 0.69mm/px · z∈[-296,-51]mm · 12 of 59 slices shown, 15 images]
[im 5/59  mediastinal]
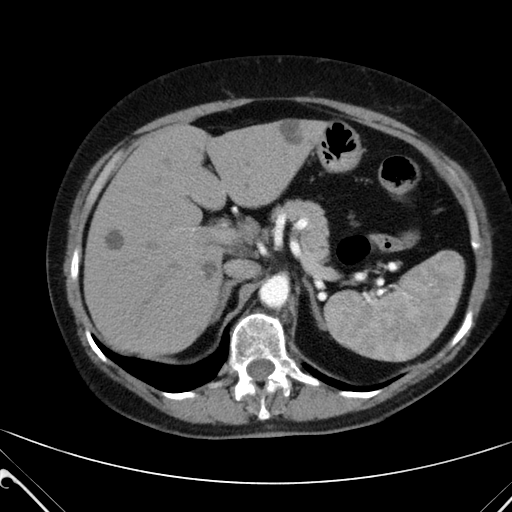
[im 5/59  lung]
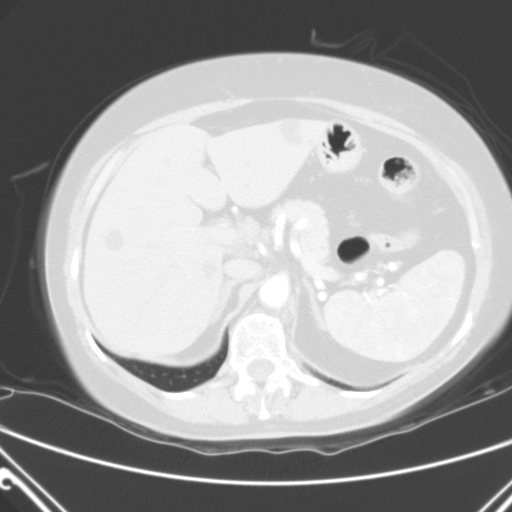
[im 9/59  lung]
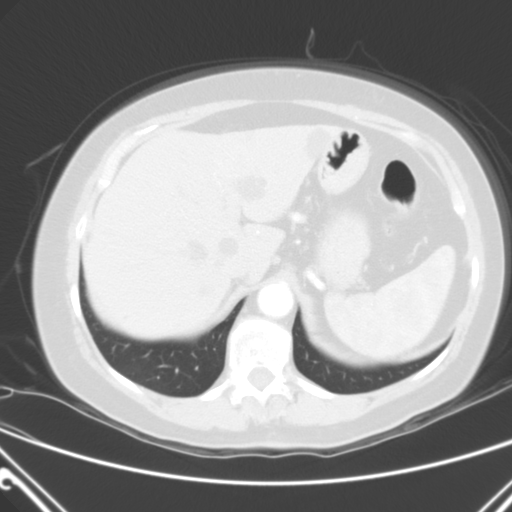
[im 13/59  lung]
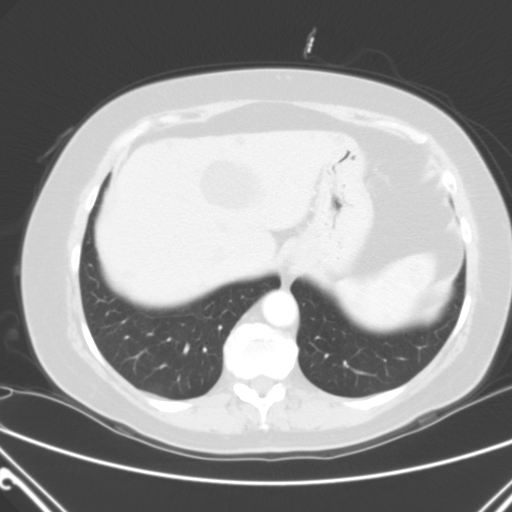
[im 18/59  lung]
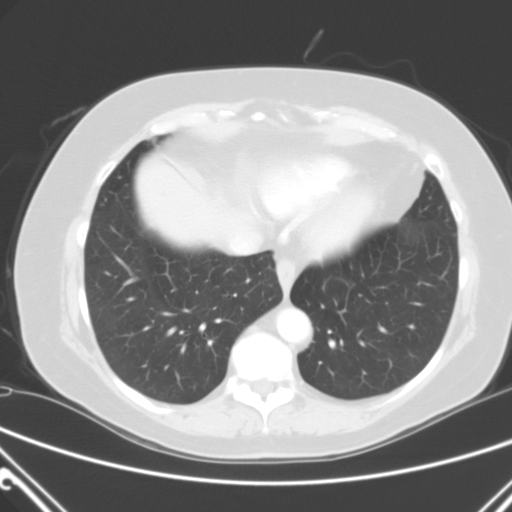
[im 22/59  mediastinal]
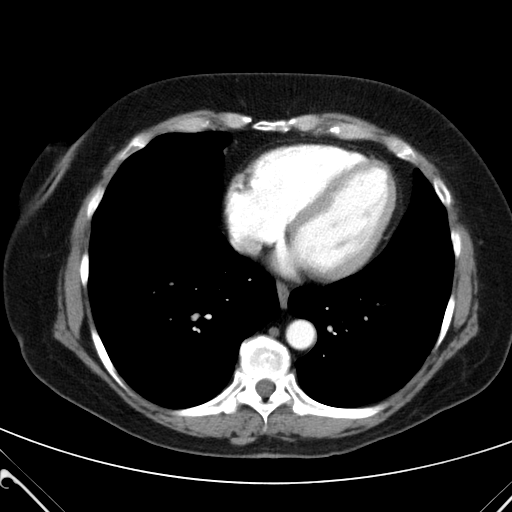
[im 22/59  lung]
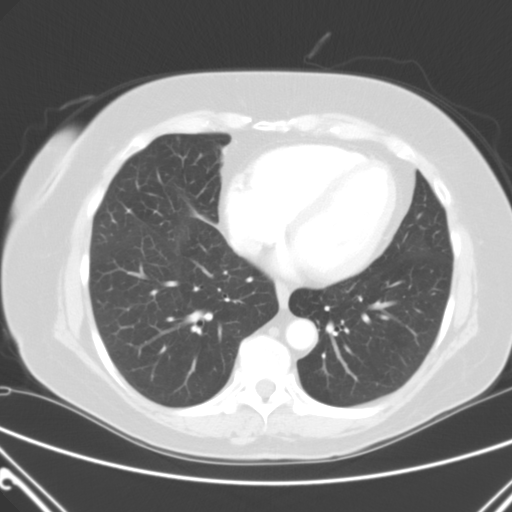
[im 26/59  lung]
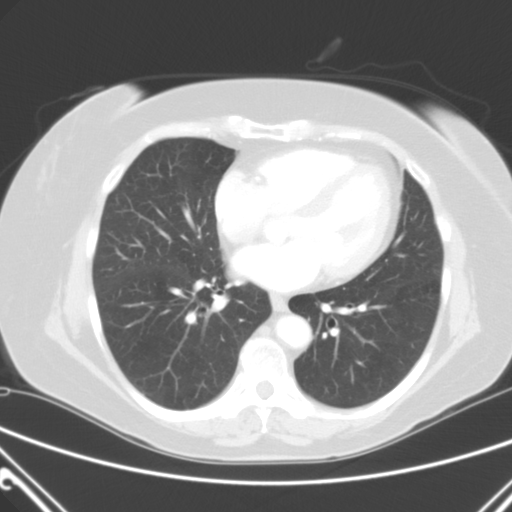
[im 33/59  lung]
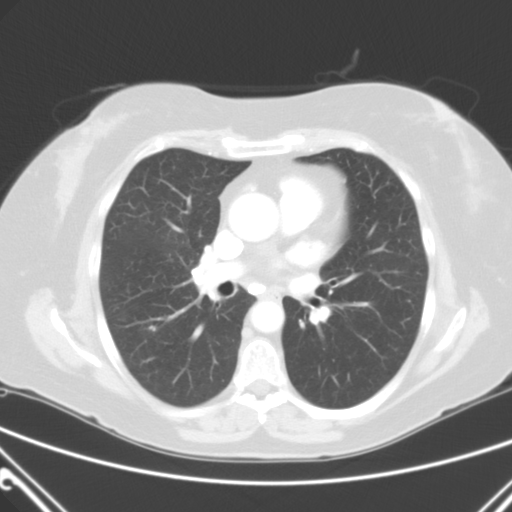
[im 37/59  lung]
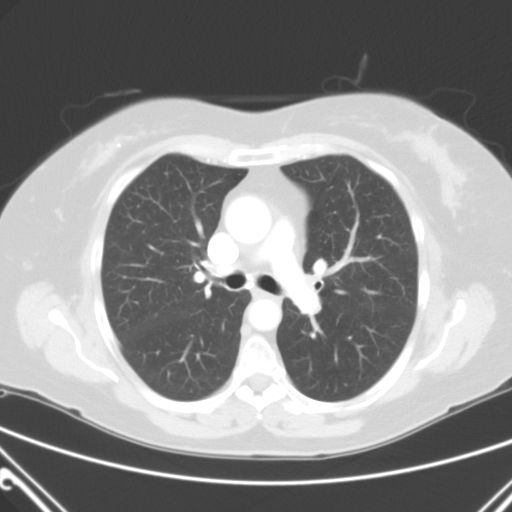
[im 41/59  mediastinal]
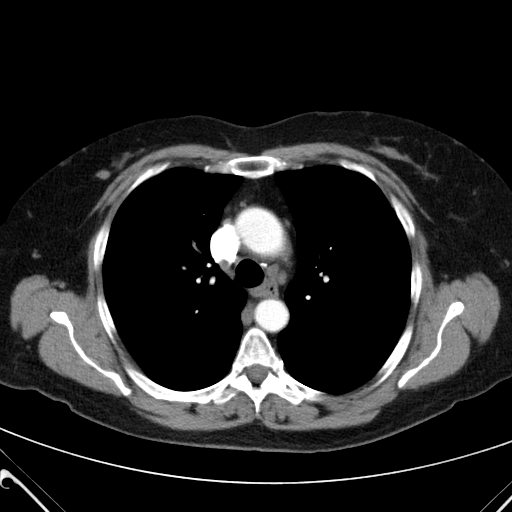
[im 41/59  lung]
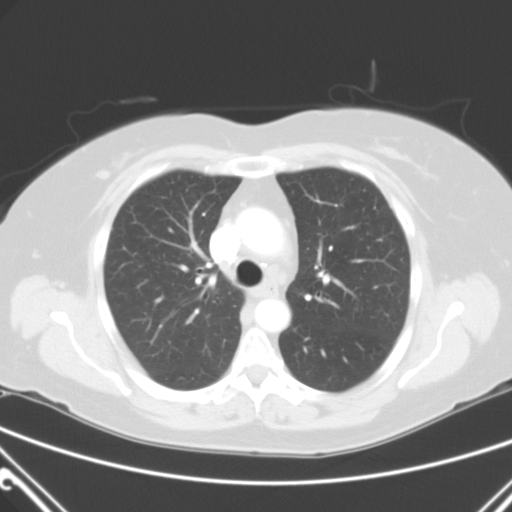
[im 46/59  lung]
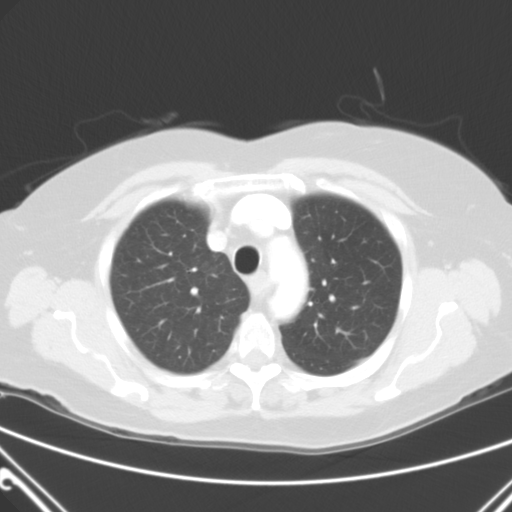
[im 50/59  lung]
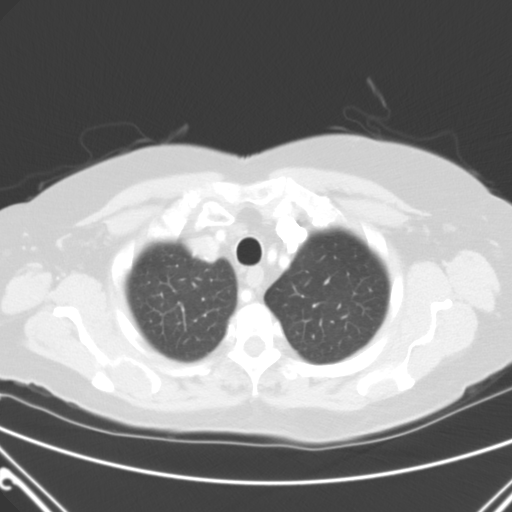
[im 54/59  lung]
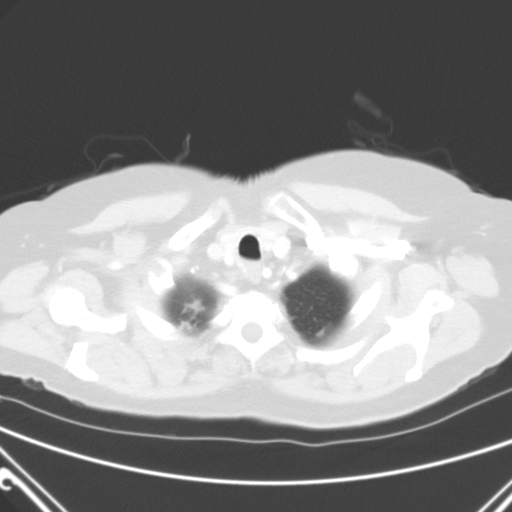

[Series 203: coronal, idose (2) · coronal · 0.50mm/px · 3 of 132 slices shown]
[im 27/132  lung]
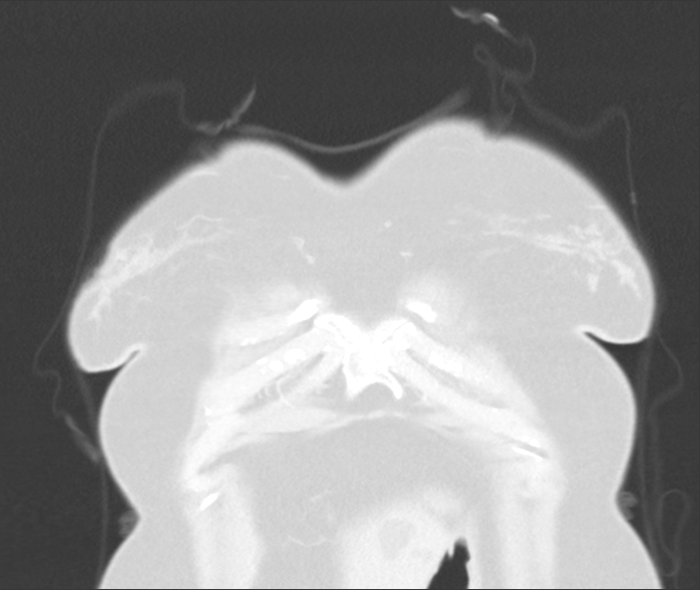
[im 53/132  lung]
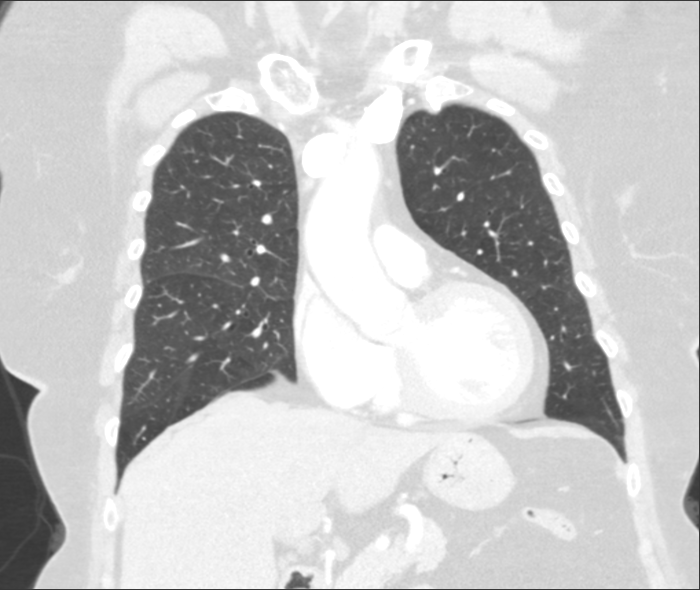
[im 79/132  lung]
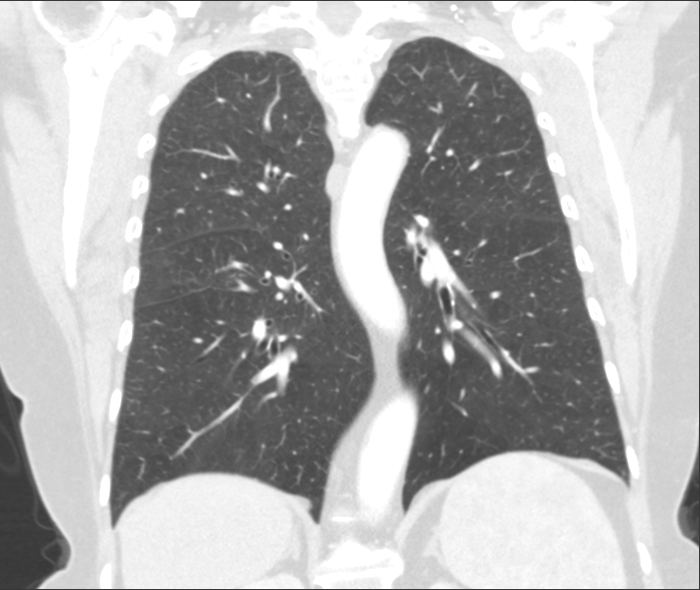

[15 of 36 positions shown; findings below may reference images not displayed]

FINDINGS: There are no pulmonary emboli. There is mild atherosclerosis of the
aorta. There is an anomalous origin of the right subclavian artery
as the last vessel from the arch. Query artery calcification is
noted. There are multiple calcified hilar and mediastinal lymph
nodes consistent with old granulomatous infection. There is mild
pleural and parenchymal scarring of both lung apices. Calcified
granuloma is present in the right upper lobe image 28. No mass,
nodule or granuloma on the left. No pleural or pericardial fluid.
Mild degenerative changes affect the spine. There is an old minor
superior endplate deformity at T11. Scans in the upper abdomen show
multiple benign appearing liver cysts.
IMPRESSION: No pulmonary emboli or acute chest pathology.

Congenital variation of anomalous origin of the right subclavian
artery. Usually, this is asymptomatic.

Some coronary artery calcification.

Zohreh complex right upper lobe with calcified granuloma and calcified
hilar and mediastinal lymph nodes.

## 2014-10-20 ENCOUNTER — Encounter: Payer: Self-pay | Admitting: *Deleted

## 2014-10-29 ENCOUNTER — Ambulatory Visit (INDEPENDENT_AMBULATORY_CARE_PROVIDER_SITE_OTHER): Payer: Medicare Other | Admitting: Internal Medicine

## 2014-10-29 ENCOUNTER — Encounter: Payer: Self-pay | Admitting: Internal Medicine

## 2014-10-29 VITALS — BP 156/73 | HR 68 | Temp 97.9°F | Wt 164.1 lb

## 2014-10-29 DIAGNOSIS — Z0000 Encounter for general adult medical examination without abnormal findings: Secondary | ICD-10-CM

## 2014-10-29 DIAGNOSIS — K589 Irritable bowel syndrome without diarrhea: Secondary | ICD-10-CM | POA: Diagnosis not present

## 2014-10-29 DIAGNOSIS — Z7982 Long term (current) use of aspirin: Secondary | ICD-10-CM

## 2014-10-29 DIAGNOSIS — Z1231 Encounter for screening mammogram for malignant neoplasm of breast: Secondary | ICD-10-CM

## 2014-10-29 DIAGNOSIS — G894 Chronic pain syndrome: Secondary | ICD-10-CM

## 2014-10-29 DIAGNOSIS — N398 Other specified disorders of urinary system: Secondary | ICD-10-CM | POA: Diagnosis not present

## 2014-10-29 DIAGNOSIS — I10 Essential (primary) hypertension: Secondary | ICD-10-CM | POA: Diagnosis not present

## 2014-10-29 DIAGNOSIS — E039 Hypothyroidism, unspecified: Secondary | ICD-10-CM

## 2014-10-29 MED ORDER — HYDROCODONE-ACETAMINOPHEN 5-325 MG PO TABS: 1.0000 | ORAL_TABLET | Freq: Four times a day (QID) | ORAL | Status: DC | PRN

## 2014-10-29 NOTE — Patient Instructions (Signed)
General Instructions:   Thank you for bringing your medicines today. This helps [place] keep you safe from mistakes.   Chronic Back Pain  When back pain lasts longer than 3 months, it is called chronic back pain.People with chronic back pain often go through certain periods that are more intense (flare-ups).  CAUSES Chronic back pain can be caused by wear and tear (degeneration) on different structures in your back. These structures include:  The bones of your spine (vertebrae) and the joints surrounding your spinal cord and nerve roots (facets).  The strong, fibrous tissues that connect your vertebrae (ligaments). Degeneration of these structures may result in pressure on your nerves. This can lead to constant pain. HOME CARE INSTRUCTIONS  Avoid bending, heavy lifting, prolonged sitting, and activities which make the problem worse.  Take brief periods of rest throughout the day to reduce your pain. Lying down or standing usually is better than sitting while you are resting.  Take over-the-counter or prescription medicines only as directed by your caregiver. SEEK IMMEDIATE MEDICAL CARE IF:   You have weakness or numbness in one of your legs or feet.  You have trouble controlling your bladder or bowels.  You have nausea, vomiting, abdominal pain, shortness of breath, or fainting. Document Released: 07/14/2004 Document Revised: 08/29/2011 Document Reviewed: 05/21/2011 [facility] Patient Information 2015 [place], [place]. This information is not intended to replace advice given to you by your health care provider. Make sure you discuss any questions you have with your health care provider.

## 2014-10-29 NOTE — Progress Notes (Signed)
Subjective:    Patient ID: Alexis Kline, female    DOB: 1946/04/06, 69 y.o.   MRN: [id number]  HPI Alexis Kline is a 69 yo F with PMHx of HTN, HLD, chronic pain and history of TB who presents for follow up for her HTN. Please see problem based assessment and plan for more information.  Review of Systems General: Admits to chronic fatigue. Denies fever, chills, change in appetite and diaphoresis.  Respiratory: Denies SOB, cough, DOE Cardiovascular: Denies chest pain and palpitations.  Gastrointestinal: Admits to bloating, intermittent diarrhea with constipation and nausea. Denies vomiting, abdominal pain, blood in stool and abdominal distention.  Genitourinary: Admits to incomplete voiding, straining and increased frequency. Denies dysuria, urgency, hematuria, suprapubic pain and flank pain or incontinence. Musculoskeletal: Admits to chronic diffuse pain and myalgias, back pain. Denies joint swelling, arthralgias. Skin: Denies pallor, rash and wounds.  Neurological: Denies dizziness, headaches, weakness, lightheadedness, numbness,  Past Medical History  Diagnosis Date  . Hypertension   . HLD (hyperlipidemia)   . GERD (gastroesophageal reflux disease)   . COPD (chronic obstructive pulmonary disease)   . Hemorrhoids   . CAD (coronary artery disease) 2007    nonobstructive, 30% LAD 02/2006  . Fibroadenoma 2008    right breast - based on [place] 09/2006  . HYSTERECTOMY, HX OF 07/10/2006    Qualifier: Diagnosis of  By: Oretha Ellis    . APPENDECTOMY, HX OF 07/10/2006    Qualifier: Diagnosis of  By: Oretha Ellis     Current Outpatient Prescriptions on File Prior to Visit  Medication Sig Dispense Refill  . aspirin 81 MG EC tablet Take 1 tablet (81 mg total) by mouth daily. 90 tablet 3  . B Complex-Biotin-FA (SUPER B-COMPLEX PO) Take 1 tablet by mouth daily.      . carvedilol (COREG) 12.5 MG tablet TAKE 1 TABLET TWICE DAILY WITH A MEAL. 60 tablet 11  .  hydrochlorothiazide (HYDRODIURIL) 25 MG tablet TAKE 1 TABLET ONCE DAILY. 30 tablet 6  . HYDROcodone-acetaminophen (NORCO/VICODIN) 5-325 MG per tablet Take 1 tablet by mouth every 6 (six) hours as needed. 45 tablet 0  . nitroGLYCERIN (NITROSTAT) 0.4 MG SL tablet Place 1 tablet (0.4 mg total) under the tongue every 5 (five) minutes. up to 3 times as needed for chest pain 30 tablet 0  . omeprazole (PRILOSEC) 40 MG capsule TAKE (1) CAPSULE DAILY. 30 capsule 11  . pravastatin (PRAVACHOL) 40 MG tablet TAKE ONE TABLET AT BEDTIME. 30 tablet 11  . senna-docusate (SENNALAX-S) 8.6-50 MG per tablet Take 2 tablets by mouth daily as needed (for constipation). 60 tablet 0   No current facility-administered medications on file prior to visit.       Objective:   Physical Exam Filed Vitals:   10/29/14 1607  BP: 156/73  Pulse: 68  Temp: 97.9 F (36.6 C)  TempSrc: Oral  Weight: 164 lb 1.6 oz (74.435 kg)  SpO2: 100%   General: Vital signs reviewed.  Patient is chronically ill appearing female, in no acute distress and cooperative with exam.   Neck: Supple, trachea midline, no thyromegaly.  Cardiovascular: RRR, S1 normal, S2 normal Pulmonary/Chest: Clear to auscultation bilaterally, no wheezes, rales, or rhonchi. Abdominal: Soft, non-tender, non-distended, BS + Extremities: Trace pitting edema in lower extremity edema bilaterally Musculoskeletal: Diffuse pain on palpation of back, but not along spine. Normal ROM, normal strength, normal sensation.  Skin: Warm, dry and intact. No rashes or erythema.    Assessment & Plan:  Please see problem oriented assessment and plan.

## 2014-10-30 ENCOUNTER — Other Ambulatory Visit: Payer: Self-pay | Admitting: Internal Medicine

## 2014-10-30 ENCOUNTER — Encounter: Payer: Self-pay | Admitting: Internal Medicine

## 2014-10-30 LAB — TSH: TSH: 1.214 u[IU]/mL (ref 0.350–4.500)

## 2014-10-30 NOTE — Progress Notes (Signed)
Medicine attending: Medical history, presenting problems, physical findings, and medications, reviewed with Dr Osa Craver and I concur with her evaluation and management plan.

## 2014-10-30 NOTE — Assessment & Plan Note (Signed)
Patient complains of a one year history of incomplete voiding and difficultly voiding where she must stand partially to void urine with evacuation of small amounts each time, leading to increased frequency of urination throughout the day. She denies any incontinence or dysuria. Patient has not seen a urologist in the past. I feel that she would benefit from urodynamic studies and referral to urology.   Plan: Urology referral

## 2014-10-30 NOTE — Assessment & Plan Note (Signed)
Referral for colonoscopy.  Referral for mammogram (last 2014 and normal).  Flu vaccine due fall 2016.

## 2014-10-30 NOTE — Assessment & Plan Note (Signed)
Patient complains of worsening of her chronic pain which is located diffusely and most in her back, but not localizable. No red flag symptoms or sign during history or on exam. Patient previously was seen by Dr. Ella Bodo at the pain clinic, but patient stopped going as she felt PT was not helping her. Currently, she manages her pain with rice packs, heating pads, aleve, tylenol, and occasional and very rarely vicodin. She has pills left over from June 2015. She has 10/10 pain about 2-3 times per month, the rest of the time her pain is manageable.   Plan: Continue rice packs and heating pads Schedule aleve and ibuprofen throughout the day for pain control Refill Norco 5-325 #45 to be taken only during times of severe pain

## 2014-10-30 NOTE — Assessment & Plan Note (Signed)
Patient complains of bloating throughout the day unrelated to food intake. She also admits to nausea without vomiting. She first describes normal BM, but upon questioning normal for patient is intermittent diarrhea with hard, small stools. She takes a stool softener. She denies weight loss, diaphoresis, melena or hematochezia. Likely related to her IBS, but patient is due for colonoscopy as last one was in 2006 and normal. Patient has also had an EGD in the past (2007) which was normal.  Plan: -Referral back to GI for colonoscopy and possible further management of IBS if colonoscopy normal

## 2014-10-30 NOTE — Addendum Note (Signed)
Addended by: Vickii Chafe on: 10/30/2014 11:17 AM   Modules accepted: Orders

## 2014-10-30 NOTE — Assessment & Plan Note (Signed)
BP Readings from Last 3 Encounters:  10/29/14 156/73  02/25/14 141/85  01/08/14 130/97    Lab Results  Component Value Date   NA 138 01/01/2014   K 3.6 01/01/2014   CREATININE 0.77 01/01/2014    Assessment: Blood pressure control:  Above goal Progress toward BP goal:   Detiorated Comments: Compliant with Carvedilol 12.5 mg BID and HCTZ 25 mg daily. Likely elevated in setting of pain. Patient reports low-normal BP at home in the 100s/70 (no associated lightheadedness)  Plan: Medications:  continue current medications Other plans: Return in 6 months for follow up

## 2014-11-05 ENCOUNTER — Ambulatory Visit
Admission: RE | Admit: 2014-11-05 | Discharge: 2014-11-05 | Disposition: A | Discharge status: Home / Self Care | Payer: Medicare Other | Source: Ambulatory Visit | Attending: Internal Medicine | Admitting: Internal Medicine

## 2014-11-05 DIAGNOSIS — Z1231 Encounter for screening mammogram for malignant neoplasm of breast: Secondary | ICD-10-CM

## 2014-11-10 ENCOUNTER — Ambulatory Visit (AMBULATORY_SURGERY_CENTER): Payer: Self-pay

## 2014-11-10 VITALS — Ht 62.0 in | Wt 164.6 lb

## 2014-11-10 DIAGNOSIS — Z1211 Encounter for screening for malignant neoplasm of colon: Secondary | ICD-10-CM

## 2014-11-10 NOTE — Progress Notes (Signed)
No allergies to eggs or soy No diet/weight loss meds No home oxygen No problems with anesthesia  Has email  Emmi instructions given for colonoscopy

## 2014-11-19 ENCOUNTER — Other Ambulatory Visit: Payer: Self-pay | Admitting: Internal Medicine

## 2014-11-21 ENCOUNTER — Telehealth: Payer: Self-pay | Admitting: Gastroenterology

## 2014-11-21 NOTE — Telephone Encounter (Signed)
Dr. Ardis Hughs,  Patient has rescheduled their upcoming Recall COLON from 6/5 to 6/22 due to high fever.

## 2014-11-21 NOTE — Telephone Encounter (Signed)
Ok, thanks.

## 2014-11-24 ENCOUNTER — Encounter: Payer: Medicare Other | Admitting: Gastroenterology

## 2014-12-10 ENCOUNTER — Encounter: Payer: Medicare Other | Admitting: Gastroenterology

## 2015-01-30 ENCOUNTER — Telehealth: Payer: Self-pay | Admitting: Gastroenterology

## 2015-01-30 NOTE — Telephone Encounter (Signed)
Called pt back, pt wanted to know when to stop taking her stool softener, advised pt she can take them up to the day before her procedure, she will not take them the day of, pt verbalized understanding-adm

## 2015-02-04 ENCOUNTER — Ambulatory Visit (AMBULATORY_SURGERY_CENTER): Payer: Medicare Other | Admitting: Gastroenterology

## 2015-02-04 ENCOUNTER — Encounter: Payer: Self-pay | Admitting: Gastroenterology

## 2015-02-04 VITALS — BP 130/78 | HR 83 | Temp 97.4°F | Resp 13 | Ht 62.0 in | Wt 164.0 lb

## 2015-02-04 DIAGNOSIS — J449 Chronic obstructive pulmonary disease, unspecified: Secondary | ICD-10-CM | POA: Diagnosis not present

## 2015-02-04 DIAGNOSIS — D122 Benign neoplasm of ascending colon: Secondary | ICD-10-CM | POA: Diagnosis not present

## 2015-02-04 DIAGNOSIS — I10 Essential (primary) hypertension: Secondary | ICD-10-CM | POA: Diagnosis not present

## 2015-02-04 DIAGNOSIS — D125 Benign neoplasm of sigmoid colon: Secondary | ICD-10-CM

## 2015-02-04 DIAGNOSIS — Z1211 Encounter for screening for malignant neoplasm of colon: Secondary | ICD-10-CM | POA: Diagnosis not present

## 2015-02-04 MED ORDER — SODIUM CHLORIDE 0.9 % IV SOLN: 500.0000 mL | INTRAVENOUS | Status: DC

## 2015-02-04 NOTE — Patient Instructions (Signed)
YOU HAD AN ENDOSCOPIC PROCEDURE TODAY AT [facility]:   Refer to the procedure report that was given to you for any specific questions about what was found during the examination.  If the procedure report does not answer your questions, please call your gastroenterologist to clarify.  If you requested that your care partner not be given the details of your procedure findings, then the procedure report has been included in a sealed envelope for you to review at your convenience later.  YOU SHOULD EXPECT: Some feelings of bloating in the abdomen. Passage of more gas than usual.  Walking can help get rid of the air that was put into your GI tract during the procedure and reduce the bloating. If you had a lower endoscopy (such as a colonoscopy or flexible sigmoidoscopy) you may notice spotting of blood in your stool or on the toilet paper. If you underwent a bowel prep for your procedure, you may not have a normal bowel movement for a few days.  Please Note:  You might notice some irritation and congestion in your nose or some drainage.  This is from the oxygen used during your procedure.  There is no need for concern and it should clear up in a day or so.  SYMPTOMS TO REPORT IMMEDIATELY:   Following lower endoscopy (colonoscopy or flexible sigmoidoscopy):  Excessive amounts of blood in the stool  Significant tenderness or worsening of abdominal pains  Swelling of the abdomen that is new, acute  Fever of 100F or higher  For urgent or emergent issues, a gastroenterologist can be reached at any hour by calling [phone].   DIET: Your first meal following the procedure should be a small meal and then it is ok to progress to your normal diet. Heavy or fried foods are harder to digest and may make you feel nauseous or bloated.  Likewise, meals heavy in dairy and vegetables can increase bloating.  Drink plenty of fluids but you should avoid alcoholic beverages for 24  hours.  ACTIVITY:  You should plan to take it easy for the rest of today and you should NOT DRIVE or use heavy machinery until tomorrow (because of the sedation medicines used during the test).    FOLLOW UP: Our staff will call the number listed on your records the next business day following your procedure to check on you and address any questions or concerns that you may have regarding the information given to you following your procedure. If we do not reach you, we will leave a message.  However, if you are feeling well and you are not experiencing any problems, there is no need to return our call.  We will assume that you have returned to your regular daily activities without incident.  If any biopsies were taken you will be contacted by phone or by letter within the next 1-3 weeks.  Please call us at [phone] if you have not heard about the biopsies in 3 weeks.    SIGNATURES/CONFIDENTIALITY: You and/or your care partner have signed paperwork which will be entered into your electronic medical record.  These signatures attest to the fact that that the information above on your After Visit Summary has been reviewed and is understood.  Full responsibility of the confidentiality of this discharge information lies with you and/or your care-partner.  Please review polyp handout provided. Next colonoscopy determined by pathology results; 5 or 10 years.

## 2015-02-04 NOTE — Progress Notes (Signed)
Transferred to recovery room. A/O x3, pleased with MAC.  VSS.  Report to Wendy, RN. 

## 2015-02-04 NOTE — Progress Notes (Signed)
Called to room to assist during endoscopic procedure.  Patient ID and intended procedure confirmed with present staff. Received instructions for my participation in the procedure from the performing physician.  

## 2015-02-04 NOTE — Op Note (Signed)
[place]  [address] [place], [id number]   COLONOSCOPY PROCEDURE REPORT  PATIENT: Alexis Kline, Alexis Kline  MR#: [id number] BIRTHDATE: 1945/07/04 , 32  yrs. old GENDER: female ENDOSCOPIST: Milus Banister, MD PROCEDURE DATE:  02/04/2015 PROCEDURE:   Colonoscopy, screening and Colonoscopy with snare polypectomy First Screening Colonoscopy - Avg.  risk and is 50 yrs.  old or older - No.  Prior Negative Screening - Now for repeat screening. 10 or more years since last screening  History of Adenoma - Now for follow-up colonoscopy & has been > or = to 3 yrs.  N/A  Polyps removed today? Yes ASA CLASS:   Class II INDICATIONS:Screening for colonic neoplasia and Colorectal Neoplasm Risk Assessment for this procedure is average risk. MEDICATIONS: Monitored anesthesia care and Propofol 350 mg IV  DESCRIPTION OF PROCEDURE:   After the risks benefits and alternatives of the procedure were thoroughly explained, informed consent was obtained.  The digital rectal exam revealed no abnormalities of the rectum.   The [id number]  endoscope was introduced through the anus and advanced to the cecum, which was identified by both the appendix and ileocecal valve. No adverse events experienced.   The quality of the prep was excellent.  The instrument was then slowly withdrawn as the colon was fully examined. Estimated blood loss is zero unless otherwise noted in this procedure report.   COLON FINDINGS: Three sessile polyps ranging between 3-70mm in size were found in the sigmoid colon and ascending colon.  Polypectomies were performed with a cold snare.  The resection was complete, the polyp tissue was partially retrieved (two of the three polyps were retrieved) and sent to histology.   The examination was otherwise normal.  Retroflexed views revealed no abnormalities. The time to cecum = 4.5 Withdrawal time = 12.8   The scope was withdrawn and the procedure  completed. COMPLICATIONS: There were no immediate complications.  ENDOSCOPIC IMPRESSION: 1.   Three sessile polyps ranging between 3-44mm in size were found in the sigmoid colon and ascending colon; polypectomies were performed with a cold snare 2.   The examination was otherwise normal  RECOMMENDATIONS: If the polyp(s) removed today are proven to be adenomatous (pre-cancerous) polyps, you will need a repeat colonoscopy in 5 years.  Otherwise you should continue to follow colorectal cancer screening guidelines for "routine risk" patients with colonoscopy in 10 years.  You will receive a letter within 1-2 weeks with the results of your biopsy as well as final recommendations.  Please call my office if you have not received a letter after 3 weeks.  eSigned:  Milus Banister, MD 02/04/2015 9:27 AM

## 2015-02-05 ENCOUNTER — Telehealth: Payer: Self-pay | Admitting: Emergency Medicine

## 2015-02-05 NOTE — Telephone Encounter (Signed)
  Follow up Call-  Call back number 02/04/2015  Post procedure Call Back phone  # [phone]  Permission to leave phone message Yes     Patient questions:  Do you have a fever, pain , or abdominal swelling? No. Pain Score  0 *  Have you tolerated food without any problems? Yes.    Have you been able to return to your normal activities? Yes.    Do you have any questions about your discharge instructions: Diet   No. Medications  No. Follow up visit  No.  Do you have questions or concerns about your Care? No.  Actions: * If pain score is 4 or above: No action needed, pain <4.

## 2015-02-09 ENCOUNTER — Encounter: Payer: Self-pay | Admitting: Gastroenterology

## 2015-03-09 ENCOUNTER — Encounter: Payer: Self-pay | Admitting: Internal Medicine

## 2015-03-09 ENCOUNTER — Ambulatory Visit (INDEPENDENT_AMBULATORY_CARE_PROVIDER_SITE_OTHER): Payer: Medicare Other | Admitting: Internal Medicine

## 2015-03-09 VITALS — BP 140/74 | HR 66 | Temp 97.8°F | Ht 62.0 in | Wt 161.5 lb

## 2015-03-09 DIAGNOSIS — Z0000 Encounter for general adult medical examination without abnormal findings: Secondary | ICD-10-CM

## 2015-03-09 DIAGNOSIS — I2510 Atherosclerotic heart disease of native coronary artery without angina pectoris: Secondary | ICD-10-CM

## 2015-03-09 DIAGNOSIS — I10 Essential (primary) hypertension: Secondary | ICD-10-CM | POA: Diagnosis not present

## 2015-03-09 DIAGNOSIS — R2681 Unsteadiness on feet: Secondary | ICD-10-CM

## 2015-03-09 DIAGNOSIS — H9313 Tinnitus, bilateral: Secondary | ICD-10-CM

## 2015-03-09 DIAGNOSIS — Z23 Encounter for immunization: Secondary | ICD-10-CM | POA: Diagnosis present

## 2015-03-09 NOTE — Patient Instructions (Signed)
CONTINUE TAKING ALL MEDICATIONS AS PRESCRIBED.  REFERRALS HAVE BEEN PLACED TO VESTIBULAR REHAB AND ENT.

## 2015-03-09 NOTE — Assessment & Plan Note (Signed)
Patient admits to 3-4 months of unsteady gait and balance issues. She denies any associated dizziness or lightheadedness. She feels like she has been walking "as if she is drunk." She denies any falls. On observing ambulation today, patient does not appear unusually unsteady and she is able to ambulate with ease in the hallways without issue. Neurological examination is unremarkable. Unsteady gait may be secondary to vestibular causes given concomitant tinnitus.   Plan: -Refer to ENT -Referral to vestibular rehab

## 2015-03-09 NOTE — Progress Notes (Signed)
Subjective:    Patient ID: Alexis Kline, female    DOB: 1945/07/24, 69 y.o.   MRN: [id number]  HPI Alexis Kline is a 69 yo female with PMHx of CAD, HTN, COPD, IBS and chronic pain who presents for follow up for her HTN and balance issues. Please see problem oriented assessment and plan for more information.   Review of Systems General: Denies fever, chills, fatigue, change in appetite and diaphoresis.  HEENT: Complains of "locusts" in ears.  Respiratory: Denies SOB, cough, DOE   Cardiovascular: Denies chest pain and palpitations.  Gastrointestinal: Denies nausea, vomiting, abdominal pain, diarrhea, constipation Musculoskeletal: Denies myalgias, back pain, joint swelling, arthralgias Skin: Denies pallor, rash and wounds.  Neurological: Admits to feeling unsteady and having balance issues. Denies dizziness, headaches, weakness, lightheadedness, numbness, seizures, and syncope.  Past Medical History  Diagnosis Date  . Hypertension   . HLD (hyperlipidemia)   . GERD (gastroesophageal reflux disease)   . COPD (chronic obstructive pulmonary disease)   . Hemorrhoids   . CAD (coronary artery disease) 2007    nonobstructive, 30% LAD 02/2006  . Fibroadenoma 2008    right breast - based on [place] 09/2006  . HYSTERECTOMY, HX OF 07/10/2006    Qualifier: Diagnosis of  By: Oretha Ellis    . APPENDECTOMY, HX OF 07/10/2006    Qualifier: Diagnosis of  By: Oretha Ellis    . Myocardial infarct, old    Outpatient Encounter Prescriptions as of 03/09/2015  Medication Sig  . aspirin 81 MG EC tablet Take 1 tablet (81 mg total) by mouth daily.  . B Complex-Biotin-FA (SUPER B-COMPLEX PO) Take 1 tablet by mouth daily.    . carvedilol (COREG) 12.5 MG tablet TAKE 1 TABLET TWICE DAILY WITH A MEAL.  . hydrochlorothiazide (HYDRODIURIL) 25 MG tablet TAKE 1 TABLET ONCE DAILY.  Marland Kitchen HYDROcodone-acetaminophen (NORCO/VICODIN) 5-325 MG per tablet Take 1 tablet by mouth every 6 (six) hours as needed.    . nitroGLYCERIN (NITROSTAT) 0.4 MG SL tablet Place 1 tablet (0.4 mg total) under the tongue every 5 (five) minutes. up to 3 times as needed for chest pain  . omeprazole (PRILOSEC) 40 MG capsule TAKE (1) CAPSULE DAILY.  . pravastatin (PRAVACHOL) 40 MG tablet TAKE ONE TABLET AT BEDTIME.  Marland Kitchen senna-docusate (SENNALAX-S) 8.6-50 MG per tablet Take 2 tablets by mouth daily as needed (for constipation).   No facility-administered encounter medications on file as of 03/09/2015.      Objective:   Physical Exam Filed Vitals:   03/09/15 1414  BP: 140/74  Pulse: 66  Temp: 97.8 F (36.6 C)  TempSrc: Oral  Height: 5\' 2"  (1.575 m)  Weight: 161 lb 8 oz (73.256 kg)  SpO2: 99%   General: Vital signs reviewed.  Patient is well-developed and well-nourished, in no acute distress and cooperative with exam.  HEENT: Normocephalic and atraumatic. EOMI, conjunctivae normal, no scleral icterus. Neck is supple, trachea midline, normal ROM, no JVD, masses, thyromegaly, or carotid bruit present. Normal tympanic membrane and external ear canal exam.  Cardiovascular: RRR, S1 normal, S2 normal, no murmurs, gallops, or rubs. Pulmonary/Chest: Clear to auscultation bilaterally, no wheezes, rales, or rhonchi. Abdominal: Soft, non-tender, non-distended, BS + Musculoskeletal: No joint deformities, erythema, or stiffness, ROM full and nontender. Extremities: No lower extremity edema bilaterally, pulses symmetric and intact bilaterally.  Neurological: A&O x3, Strength is normal and symmetric bilaterally, cranial nerve II-XII are grossly intact, no focal motor deficit, sensory intact to light touch bilaterally.  Skin: Warm,  dry and intact. No rashes or erythema. Psychiatric: Normal mood and affect. speech and behavior is normal. Cognition and memory are normal.     Assessment & Plan:   Please see problem oriented assessment and plan.

## 2015-03-09 NOTE — Assessment & Plan Note (Signed)
Patient complains of humming in both of her ears that sounds like "locusts." She has experienced this for about 6 years, but feels that it has been getting worse. She believes she has lost hearing to high pitch sounds in her right ear, but that the humming is louder in the left ear. She admits to head trauma in the 1990s when a steel beam fell on her head. Imaging at that time was normal, and repeat imaging in [place] was also unremarkable. She saw an audiologist several years ago and findings were normal. Exam today is normal.   Plan: -Refer to ENT -Referral to vestibular rehab

## 2015-03-09 NOTE — Assessment & Plan Note (Signed)
Colonoscopy: 02/04/15 with 3 polyps, recommends repeat in 5 years (01/2020) Mammogram: 11/05/14 normal, repeat one year Flu shot: 03/09/15

## 2015-03-09 NOTE — Assessment & Plan Note (Signed)
No chest pain. Patient has been compliant with ASA, Coreg and pravastatin 40 mg daily. Patient should likely be on high intensity statin and will discuss increasing pravastatin to 80 mg at next visit.

## 2015-03-09 NOTE — Assessment & Plan Note (Signed)
BP Readings from Last 3 Encounters:  03/09/15 140/74  02/04/15 130/78  10/29/14 156/73    Lab Results  Component Value Date   NA 138 01/01/2014   K 3.6 01/01/2014   CREATININE 0.77 01/01/2014    Assessment: Blood pressure control:  Controlled Progress toward BP goal:   At goal Comments: Compliant with Coreg 12.5 BID and HCTZ 25 mg daily.   Plan: Medications:  continue current medications Educational resources provided: brochure, handout, video

## 2015-03-10 NOTE — Addendum Note (Signed)
Addended by: Vickii Chafe on: 03/10/2015 10:35 AM   Modules accepted: Orders

## 2015-03-10 NOTE — Progress Notes (Signed)
Internal Medicine Clinic Attending  Case discussed with Dr. Richardson at the time of the visit.  We reviewed the resident's history and exam and pertinent patient test results.  I agree with the assessment, diagnosis, and plan of care documented in the resident's note. 

## 2015-03-19 DIAGNOSIS — H903 Sensorineural hearing loss, bilateral: Secondary | ICD-10-CM | POA: Diagnosis not present

## 2015-03-19 DIAGNOSIS — H9313 Tinnitus, bilateral: Secondary | ICD-10-CM | POA: Diagnosis not present

## 2015-03-23 ENCOUNTER — Other Ambulatory Visit: Payer: Self-pay | Admitting: *Deleted

## 2015-03-23 MED ORDER — SENNOSIDES-DOCUSATE SODIUM 8.6-50 MG PO TABS: 2.0000 | ORAL_TABLET | Freq: Every day | ORAL | Status: DC | PRN

## 2015-04-06 ENCOUNTER — Ambulatory Visit: Payer: Medicare Other | Attending: Internal Medicine

## 2015-04-06 DIAGNOSIS — R2681 Unsteadiness on feet: Secondary | ICD-10-CM

## 2015-04-06 DIAGNOSIS — R269 Unspecified abnormalities of gait and mobility: Secondary | ICD-10-CM | POA: Insufficient documentation

## 2015-04-06 DIAGNOSIS — R42 Dizziness and giddiness: Secondary | ICD-10-CM | POA: Diagnosis not present

## 2015-04-06 NOTE — Therapy (Signed)
[facility] [address] [place], [place], [address] Phone: [phone]   Fax:  [phone]  Physical Therapy Evaluation  Patient Details  Name: Alexis Kline MRN: [id number] Date of Birth: February 24, 1946 Referring Provider: Dr. Marvel Plan  Encounter Date: 04/06/2015      PT End of Session - 04/06/15 1557    Visit Number 1   Number of Visits 17   Date for PT Re-Evaluation 06/05/15   Authorization Type G-code every 10th visit.   PT Start Time 1505   PT Stop Time 1544   PT Time Calculation (min) 39 min   Activity Tolerance Patient tolerated treatment well   Behavior During Therapy WFL for tasks assessed/performed      Past Medical History  Diagnosis Date  . Hypertension   . HLD (hyperlipidemia)   . GERD (gastroesophageal reflux disease)   . COPD (chronic obstructive pulmonary disease) ([place])   . Hemorrhoids   . CAD (coronary artery disease) 2007    nonobstructive, 30% LAD 02/2006  . Fibroadenoma 2008    right breast - based on [place] 09/2006  . HYSTERECTOMY, HX OF 07/10/2006    Qualifier: Diagnosis of  By: Oretha Ellis    . APPENDECTOMY, HX OF 07/10/2006    Qualifier: Diagnosis of  By: Oretha Ellis    . Myocardial infarct, old     Past Surgical History  Procedure Laterality Date  . Appendectomy  1966  . Abdominal hysterectomy  1988  . Breast lumpectomy      right breat  . Ptca  2007  . Rectal stapling  2008    Done by Dr. Marlou Starks  . Carpal tunnel release  1991, 1993    3 operations in right hand and 2 operations in left hand. Dr. Redmond Pulling in [place]  . Shoulder surgery  1991    Right shoulder surgery in 1991 and left shoulder surgery in 1992 by Dr. Redmond Pulling in [place].  . Bladder suspension  1997  . Rectal surgery  01/07/2007    Dr. Marlou Starks in [place].     There were no vitals filed for this visit.  Visit Diagnosis:  Abnormality of gait - Plan: PT plan of care cert/re-cert  Dizziness and  giddiness - Plan: PT plan of care cert/re-cert  Unsteadiness - Plan: PT plan of care cert/re-cert      Subjective Assessment - 04/06/15 1514    Subjective Pt reported she has been experiencing impaired balance during amb. and during functional activities, which began approx. 6-8 months ago and has been getting worse. Pt reported she feels like she has "roller balls" on her feet, and it causes her to feel wobbly. Pt reports she feels dizzy when she bends down and when she goes to stand up. She describes it as lightheadedness and spinning at times when moving her head.  Pt  reported she fell once when mopping  and she fell into the bathtub. Pt has difficulty walking over uneven sidewalks and uneven ground.  Pt also reports tinnitus ("sounds like cellophane paper in my ear), in B ears since 2006.   Pertinent History HTN, MI, B shoulder rotator surgeries and carpel tunnel syndrome, COPD   Patient Stated Goals Get my energy back and move without fear of falling   Currently in Pain? Yes   Pain Score 7    Pain Location Other (Comment)  "all down my back"   Pain Orientation Upper;Mid;Lower   Pain Descriptors / Indicators Aching  Pain Type Chronic pain   Pain Onset More than a month ago   Pain Frequency Constant   Aggravating Factors  walking a lot   Pain Relieving Factors nothing            [facility] PT Assessment - 04/06/15 1522    Assessment   Medical Diagnosis Unsteady gait   Referring Provider Dr. Marvel Plan   Onset Date/Surgical Date 08/19/14   Prior Therapy none for balance   Precautions   Precautions Fall   Precaution Comments based on DGI score   Restrictions   Weight Bearing Restrictions No   Balance Screen   Has the patient fallen in the past 6 months Yes   How many times? 1  pt reports she has run into walls at home.   Has the patient had a decrease in activity level because of a fear of falling?  No   Is the patient reluctant to leave their home because of a fear of  falling?  No   Home Environment   Living Environment Private residence   Living Arrangements Alone   Available Help at Discharge --  none   Type of [place] One level   [place] - single point;Grab bars - tub/shower   Prior Function   Level of Independence Independent   Vocation Retired   Leisure Research scientist (medical)   Overall Cognitive Status Within Functional Limits for tasks assessed   Sensation   Light Touch Appears Intact   Posture/Postural Control   Posture/Postural Control Postural limitations   Postural Limitations Forward head   ROM / Strength   AROM / PROM / Strength AROM;Strength   AROM   Overall AROM  Deficits  B UE shoulder flexion limited to approx. 110 degrees   Overall AROM Comments B LE WFL.   Strength   Overall Strength Deficits   Overall Strength Comments R LE WFL (4/5). L LE knee ext: 4/5, knee flex: 3+/5, ankle DF: 4/5. Will asess glute med strength next session.   Transfers   Transfers Sit to Stand;Stand to Sit   Sit to Stand 5: Supervision;With upper extremity assist;From chair/3-in-1   Sit to Stand Details (indicate cue type and reason) Pt reports she feels like she is leaning back when she tries to stand up from chair.   Stand to Sit 5: Supervision;With upper extremity assist;To chair/3-in-1   Ambulation/Gait   Ambulation/Gait Yes   Ambulation/Gait Assistance 5: Supervision;4: Min assist;4: Min guard   Ambulation/Gait Assistance Details Supervision when pt amb. in straight trajectory, min guard when traversing corners and min A during 180 degree turn.   Ambulation Distance (Feet) 300 Feet   Assistive device None   Gait Pattern Step-through pattern;Decreased stride length;Decreased dorsiflexion - left;Decreased dorsiflexion - right;Scissoring  intermittent scissoring, not due to incr. tone   Ambulation Surface Level;Indoor   Gait velocity 2.78ft/sec.  no AD   Standardized Balance  Assessment   Standardized Balance Assessment Dynamic Gait Index;Timed Up and Go Test   Dynamic Gait Index   Level Surface Mild Impairment   Change in Gait Speed Moderate Impairment   Gait with Horizontal Head Turns Mild Impairment   Gait with Vertical Head Turns Moderate Impairment   Gait and Pivot Turn Severe Impairment   Step Over Obstacle Moderate Impairment   Step Around Obstacles Mild Impairment   Steps Moderate Impairment   Total Score 10   Timed Up and Go Test  TUG Normal TUG   Normal TUG (seconds) 13.3  no AD                            PT Education - 04/30/2015 1556    Education provided Yes   Education Details PT encouraged pt to use SPC during amb. based on falls risk (DGI score). PT explained outcome measure results and PT frequency/duration.   Person(s) Educated Patient   Methods Explanation   Comprehension Verbalized understanding          PT Short Term Goals - 04-30-2015 1602    PT SHORT TERM GOAL #1   Title Pt will be IND in HEP to improve balance, strength, flexibility, and endurance. Target date: 05/04/15.   Status New   PT SHORT TERM GOAL #2   Title Pt will verbalize fall prevention strategies to decr. falls risk. Target date: 05/04/15.   Status New           PT Long Term Goals - April 30, 2015 1603    PT LONG TERM GOAL #1   Title Pt will improve DGI score to >/=20/24 to decr. falls risk. Target date: 06/01/15.   Status New   PT LONG TERM GOAL #2   Title Pt will ambulate 1000' over even/uneven terrain with LRAD at MOD I level to improve functional mobility. Target date: 06/01/15.   Status New   PT LONG TERM GOAL #3   Title Pt will amb. 400' indoors over even terrain, IND, to improve functional mobility. Target date: 06/01/15.   Status New   PT LONG TERM GOAL #4   Title Pt will report 0/10 dizziness during sit to stand and while turning to improve safety and quality of life. Target date: 06/01/15.   Status New                Plan - 30-Apr-2015 1558    Clinical Impression Statement Pt is a pleasant 68y/o female presenting to OPPT neuro for unsteady gait. Pt presented with decreased strength, decreased endurance, impaired balance, gait deviations, decreased ROM and dizziness. Pt's DGI score indicates pt is at risk for falls.    Pt will benefit from skilled therapeutic intervention in order to improve on the following deficits Abnormal gait;Decreased endurance;Postural dysfunction;Impaired flexibility;Decreased range of motion;Dizziness;Decreased safety awareness;Decreased mobility;Decreased balance;Decreased knowledge of use of DME;Decreased strength;Pain  PT will monitor pain but will not directly address   Rehab Potential Good   PT Frequency 2x / week   PT Duration 8 weeks  PT recommends 2x/week for 8 weeks but 2/2 finances pt requested 1x/week for 8 weeks   PT Treatment/Interventions ADLs/Self Care Home Management;Neuromuscular re-education;Patient/family education;Biofeedback;Canalith Repostioning;Vestibular;Manual techniques;Therapeutic exercise;Balance training;Therapeutic activities;Functional mobility training;Stair training;Gait training;DME Instruction   PT Next Visit Plan Assess orthostatics and perform SOT and assess peripheral visual fields as pt reports decr. peripheral vision (chronic). Initiate balance/strength/flexibility HEP.   Consulted and Agree with Plan of Care Patient          G-Codes - 30-Apr-2015 1605    Functional Assessment Tool Used DGI: 10/24   Functional Limitation Mobility: Walking and moving around   Mobility: Walking and Moving Around Current Status [phone]) At least 40 percent but less than 60 percent impaired, limited or restricted   Mobility: Walking and Moving Around Goal Status [phone]) At least 1 percent but less than 20 percent impaired, limited or restricted       Problem List Patient Active Problem List  Diagnosis Date Noted  . Unsteady gait 03/09/2015  . Clicking tinnitus  of both ears 03/09/2015  . Dysfunctional voiding of urine 10/29/2014  . Worsening vision 02/25/2014  . History of TB (tuberculosis) 01/02/2014  . Chest pain, unspecified 01/01/2014  . Cervical spondylosis without myelopathy 11/22/2013  . Chronic pain syndrome 07/12/2013  . Healthcare maintenance 03/15/2013  . Myofascial pain 12/19/2012    Class: Acute  . 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 Lumbar disc herniation with radiculopathy 02/25/2011  . COPD, MILD 08/28/2007  . MIGRAINE HEADACHE 09/23/2006  . Coronary atherosclerosis 07/10/2006  . IRRITABLE BOWEL, PREDOMINANTLY CONSTIPATION 07/10/2006  . Hyperlipidemia 05/22/2006  . Essential hypertension 05/22/2006  . TOBACCO ABUSE 05/22/2006    Romario Tith L 04/06/2015, 4:07 PM  [facility] [address] [place], [place], [address] Phone: [phone]   Fax:  [phone]  Name: Alexis Kline MRN: [id number] Date of Birth: 23-Mar-1946   Geoffry Paradise, PT,DPT 04/06/2015 4:07 PM Phone: [phone] Fax: [phone]

## 2015-04-16 ENCOUNTER — Ambulatory Visit: Payer: Medicare Other

## 2015-04-24 ENCOUNTER — Ambulatory Visit: Payer: Medicare Other

## 2015-04-28 ENCOUNTER — Ambulatory Visit: Payer: Medicare Other | Attending: Internal Medicine

## 2015-04-28 ENCOUNTER — Encounter: Payer: Self-pay | Admitting: Internal Medicine

## 2015-04-28 VITALS — HR 78

## 2015-04-28 DIAGNOSIS — R269 Unspecified abnormalities of gait and mobility: Secondary | ICD-10-CM | POA: Diagnosis not present

## 2015-04-28 DIAGNOSIS — R42 Dizziness and giddiness: Secondary | ICD-10-CM | POA: Diagnosis not present

## 2015-04-28 NOTE — Patient Instructions (Addendum)
Call Dr. Marvel Plan when you get home and schedule an appointment regarding high blood pressure. If blood pressure remains high (top number in the 190s and bottom number in the 100s) when you get home, go to the emergency room.  Do not drink coffee when you get home, as this could increase your blood pressure.

## 2015-04-28 NOTE — Therapy (Addendum)
[facility] [address] [place], [place], [address] Phone: [phone]   Fax:  [phone]  Physical Therapy Treatment  Patient Details  Name: Alexis Kline MRN: [id number] Date of Birth: Nov 17, 1945 Referring Provider: Dr. Marvel Plan  Encounter Date: 04/28/2015      PT End of Session - 04/28/15 1607    Visit Number 2   Number of Visits 17   Date for PT Re-Evaluation 06/05/15   Authorization Type G-code every 10th visit.   PT Start Time 1532   PT Stop Time 1559   PT Time Calculation (min) 27 min   Activity Tolerance Other (comment)  incr. BP   Behavior During Therapy [facility] for tasks assessed/performed      Past Medical History  Diagnosis Date  . Hypertension   . HLD (hyperlipidemia)   . GERD (gastroesophageal reflux disease)   . COPD (chronic obstructive pulmonary disease) ([place])   . Hemorrhoids   . CAD (coronary artery disease) 2007    nonobstructive, 30% LAD 02/2006  . Fibroadenoma 2008    right breast - based on [place] 09/2006  . HYSTERECTOMY, HX OF 07/10/2006    Qualifier: Diagnosis of  By: Oretha Ellis    . APPENDECTOMY, HX OF 07/10/2006    Qualifier: Diagnosis of  By: Oretha Ellis    . Myocardial infarct, old     Past Surgical History  Procedure Laterality Date  . Appendectomy  1966  . Abdominal hysterectomy  1988  . Breast lumpectomy      right breat  . Ptca  2007  . Rectal stapling  2008    Done by Dr. Marlou Starks  . Carpal tunnel release  1991, 1993    3 operations in right hand and 2 operations in left hand. Dr. Redmond Pulling in [place]  . Shoulder surgery  1991    Right shoulder surgery in 1991 and left shoulder surgery in 1992 by Dr. Redmond Pulling in [place].  . Bladder suspension  1997  . Rectal surgery  01/07/2007    Dr. Marlou Starks in [place].     Filed Vitals:   04/28/15 1536  Pulse: 78  SpO2: 98%    Visit Diagnosis:  Dizziness and giddiness  Abnormality of gait      Subjective  Assessment - 04/28/15 1536    Subjective Pt reported she missed PT appt's due to 102 degree fever, but feels better now. Pt reported dizziness and "wobbly feeling" has been about the same. Pt reported she had a cigarette with a girlfriend the other day and denied smoking today. However, pt appeared to smell like cigarettes. Pt reported she felt "short-winded" after walking from bus stop to PT.    Pertinent History HTN, MI, B shoulder rotator surgeries and carpel tunnel syndrome, COPD   Patient Stated Goals Get my energy back and move without fear of falling   Currently in Pain? Yes   Pain Score 4    Pain Location --  back and hips   Pain Orientation Right;Left;Lower   Pain Type Chronic pain   Pain Onset More than a month ago   Pain Frequency Intermittent  pt now reports "it goes away and then comes back full force".   Aggravating Factors  walking a lot   Pain Relieving Factors nothing           Neuro re-ed:     Vestibular Assessment - 04/28/15 0001    Orthostatics   BP supine (x 5 minutes) 145/100 mmHg  bolster placed under pt's knees for comfort.   HR supine (x 5 minutes) 80   BP sitting 132/98 mmHg   HR sitting 78   BP standing (after 1 minute) 122/87 mmHg   HR standing (after 1 minute) 82                    Self Care:     PT Education - 04/28/15 1606    Education provided Yes   Education Details PT reiterated the importance of calling MD when pt got home to inform her of increased BP and to schedule appt with MD. If pt's BP remains high PT educated pt to go to ED. PT also reiterated the importance of not drinking coffee when she gets home as this could incr. BP, pt reported she would drink coffee when she got home when PT asked.   Person(s) Educated Patient   Methods Explanation;Handout   Comprehension Verbalized understanding;Need further instruction          PT Short Term Goals - 04/28/15 1609    PT SHORT TERM GOAL #1   Title Pt will be IND in  HEP to improve balance, strength, flexibility, and endurance. Target date: 05/04/15.   Status On-going   PT SHORT TERM GOAL #2   Title Pt will verbalize fall prevention strategies to decr. falls risk. Target date: 05/04/15.   Status On-going           PT Long Term Goals - 04/28/15 1609    PT LONG TERM GOAL #1   Title Pt will improve DGI score to >/=20/24 to decr. falls risk. Target date: 06/01/15.   Status On-going   PT LONG TERM GOAL #2   Title Pt will ambulate 1000' over even/uneven terrain with LRAD at MOD I level to improve functional mobility. Target date: 06/01/15.   Status On-going   PT LONG TERM GOAL #3   Title Pt will amb. 400' indoors over even terrain, IND, to improve functional mobility. Target date: 06/01/15.   Status On-going   PT LONG TERM GOAL #4   Title Pt will report 0/10 dizziness during sit to stand and while turning to improve safety and quality of life. Target date: 06/01/15.   Status On-going               Plan - 04/28/15 1608    Clinical Impression Statement PT ceased treatment, as pt's diastolic BP was increased after 5 minutes of rest, and pt has hx of HTN and MI. PT reiterated the importance of informing MD when pt got home, PT will also route note to MD. Pt was negative for symptoms of orthostatic hypotension, but did experince a significant decrease in both systolic and diastolic BP. Continue with POC, once BP is controlled.   Pt will benefit from skilled therapeutic intervention in order to improve on the following deficits Abnormal gait;Decreased endurance;Postural dysfunction;Impaired flexibility;Decreased range of motion;Dizziness;Decreased safety awareness;Decreased mobility;Decreased balance;Decreased knowledge of use of DME;Decreased strength;Pain   Rehab Potential Good   PT Frequency 2x / week   PT Duration 8 weeks   PT Treatment/Interventions ADLs/Self Care Home Management;Neuromuscular re-education;Patient/family  education;Biofeedback;Canalith Repostioning;Vestibular;Manual techniques;Therapeutic exercise;Balance training;Therapeutic activities;Functional mobility training;Stair training;Gait training;DME Instruction   PT Next Visit Plan Perform SOT and assess peripheral visual fields as pt reports decr. peripheral vision (chronic). Initiate balance/strength/flexibility HEP.   Consulted and Agree with Plan of Care Patient        Problem List Patient Active Problem List   Diagnosis  Date Noted  . Unsteady gait 03/09/2015  . Clicking tinnitus of both ears 03/09/2015  . Dysfunctional voiding of urine 10/29/2014  . Worsening vision 02/25/2014  . History of TB (tuberculosis) 01/02/2014  . Chest pain, unspecified 01/01/2014  . Cervical spondylosis without myelopathy 11/22/2013  . Chronic pain syndrome 07/12/2013  . Healthcare maintenance 03/15/2013  . Myofascial pain 12/19/2012    Class: Acute  . 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 Lumbar disc herniation with radiculopathy 02/25/2011  . COPD, MILD 08/28/2007  . MIGRAINE HEADACHE 09/23/2006  . Coronary atherosclerosis 07/10/2006  . IRRITABLE BOWEL, PREDOMINANTLY CONSTIPATION 07/10/2006  . Hyperlipidemia 05/22/2006  . Essential hypertension 05/22/2006  . TOBACCO ABUSE 05/22/2006    Bradan Congrove L 04/28/2015, 4:24 PM  [facility] [address] [place], [place], [address] Phone: [phone]   Fax:  [phone]  Name: Alexis Kline MRN: [id number] Date of Birth: August 03, 1945    Geoffry Paradise, PT,DPT 04/28/2015 4:24 PM Phone: [phone] Fax: [phone]

## 2015-04-29 ENCOUNTER — Encounter: Payer: Medicare Other | Admitting: Internal Medicine

## 2015-05-04 ENCOUNTER — Encounter: Payer: Self-pay | Admitting: Internal Medicine

## 2015-05-04 ENCOUNTER — Ambulatory Visit (INDEPENDENT_AMBULATORY_CARE_PROVIDER_SITE_OTHER): Payer: Medicare Other | Admitting: Internal Medicine

## 2015-05-04 VITALS — BP 123/71 | HR 68 | Temp 98.2°F | Ht 62.0 in | Wt 161.9 lb

## 2015-05-04 DIAGNOSIS — Z23 Encounter for immunization: Secondary | ICD-10-CM | POA: Insufficient documentation

## 2015-05-04 DIAGNOSIS — R2681 Unsteadiness on feet: Secondary | ICD-10-CM

## 2015-05-04 DIAGNOSIS — Z0000 Encounter for general adult medical examination without abnormal findings: Secondary | ICD-10-CM | POA: Diagnosis not present

## 2015-05-04 DIAGNOSIS — I2510 Atherosclerotic heart disease of native coronary artery without angina pectoris: Secondary | ICD-10-CM | POA: Diagnosis not present

## 2015-05-04 DIAGNOSIS — M810 Age-related osteoporosis without current pathological fracture: Secondary | ICD-10-CM

## 2015-05-04 DIAGNOSIS — I10 Essential (primary) hypertension: Secondary | ICD-10-CM | POA: Diagnosis not present

## 2015-05-04 DIAGNOSIS — Z1159 Encounter for screening for other viral diseases: Secondary | ICD-10-CM

## 2015-05-04 DIAGNOSIS — E785 Hyperlipidemia, unspecified: Secondary | ICD-10-CM

## 2015-05-04 NOTE — Assessment & Plan Note (Signed)
BP Readings from Last 3 Encounters:  05/04/15 123/71  03/09/15 140/74  02/04/15 130/78    Lab Results  Component Value Date   NA 138 01/01/2014   K 3.6 01/01/2014   CREATININE 0.77 01/01/2014    Assessment: Blood pressure control:  Well controlled Progress toward BP goal:   At goal Comments: Compliant with HCTZ 25 mg daily and coreg 12.5 mg BID. Patient has been receiving PT and last session was cancelled as BP was 145/100. Orthostatics showed: laying 145/100 HR 80, sitting 132/98 HR 78, standing 122/82 HR 82; no symptoms of orthostatic hypotension. Patient checks BP at home and it is well controlled.   Plan: Medications:  continue current medications Educational resources provided: brochure, handout Self management tools provided:   Other plans: Okay for PT if BP <180/110.

## 2015-05-04 NOTE — Patient Instructions (Signed)
CONTINUE MEDICATIONS AS PRESCRIBED.  OKAY FOR PHYSICAL THERAPY AS LONG AS BLOOD PRESSURE IS LESS THAN 180/110.  FOLLOW UP WITH EAR, NOSE, THROAT DOCTOR FOR RINGING IN EAR.

## 2015-05-04 NOTE — Assessment & Plan Note (Signed)
Patient is following up with ENT at the end of month given unsteady gait and tinnitus. She does not feel PT is helpful.   Plan: -Follow up with ENT

## 2015-05-04 NOTE — Assessment & Plan Note (Signed)
Discussed increasing pravastatin to 80 mg daily given history of CAD. Patient is not interested at this time.   Plan: -Continue pravastatin 40 mg daily

## 2015-05-04 NOTE — Assessment & Plan Note (Signed)
Received pneumonia vaccination on 05/04/15.  DEXA scan ordered for screening for osteoporosis.  Will screen for Hepatitis C today.

## 2015-05-04 NOTE — Progress Notes (Signed)
Subjective:    Patient ID: Alexis Kline, female    DOB: [birth year], 69 y.o.   MRN: [id number]  HPI Alexis Kline SUD is a 69 y.o. female with PMHx of HTN, CAD, COPD who presents to the clinic for HTN. Please see A&P for the status of the patient's chronic medical problems.   Past Medical History  Diagnosis Date  . Hypertension   . HLD (hyperlipidemia)   . GERD (gastroesophageal reflux disease)   . COPD (chronic obstructive pulmonary disease) ([place])   . Hemorrhoids   . CAD (coronary artery disease) 2007    nonobstructive, 30% LAD 02/2006  . Fibroadenoma 2008    right breast - based on [place] 09/2006  . HYSTERECTOMY, HX OF 07/10/2006    Qualifier: Diagnosis of  By: Oretha Ellis    . APPENDECTOMY, HX OF 07/10/2006    Qualifier: Diagnosis of  By: Oretha Ellis    . Myocardial infarct, old     Outpatient Encounter Prescriptions as of 05/04/2015  Medication Sig  . aspirin 81 MG EC tablet Take 1 tablet (81 mg total) by mouth daily.  . B Complex-Biotin-FA (SUPER B-COMPLEX PO) Take 1 tablet by mouth daily.    . carvedilol (COREG) 12.5 MG tablet TAKE 1 TABLET TWICE DAILY WITH A MEAL.  . hydrochlorothiazide (HYDRODIURIL) 25 MG tablet TAKE 1 TABLET ONCE DAILY.  Marland Kitchen HYDROcodone-acetaminophen (NORCO/VICODIN) 5-325 MG per tablet Take 1 tablet by mouth every 6 (six) hours as needed.  . nitroGLYCERIN (NITROSTAT) 0.4 MG SL tablet Place 1 tablet (0.4 mg total) under the tongue every 5 (five) minutes. up to 3 times as needed for chest pain  . omeprazole (PRILOSEC) 40 MG capsule TAKE (1) CAPSULE DAILY.  . pravastatin (PRAVACHOL) 40 MG tablet TAKE ONE TABLET AT BEDTIME.  Marland Kitchen senna-docusate (SENNALAX-S) 8.6-50 MG tablet Take 2 tablets by mouth daily as needed (for constipation).   No facility-administered encounter medications on file as of 05/04/2015.    Family History  Problem Relation Age of Onset  . Hyperlipidemia Mother   . Hypertension Mother   . Hyperlipidemia Father   .  Hypertension Father   . Heart disease Sister   . Stroke Neg Hx   . Diabetes Neg Hx   . Colon cancer Neg Hx     Social History   Social History  . Marital Status: Widowed    Spouse Name: N/A  . Number of Children: N/A  . Years of Education: N/A   Occupational History  . Not on file.   Social History Main Topics  . Smoking status: Former Smoker -- 85 years    Types: Cigarettes    Quit date: 06/20/2013  . Smokeless tobacco: Never Used     Comment: electronic cig  . Alcohol Use: 0.0 oz/week    0 Standard drinks or equivalent per week     Comment: rarely "maybe a drink of New Year's"  . Drug Use: No  . Sexual Activity: Not Currently   Other Topics Concern  . Not on file   Social History Narrative    Review of Systems General: Denies fever, chills, fatigue  ENT: Complains of tinnitus (chronic).  Respiratory: Denies SOB, cough, DOE Cardiovascular: Denies chest pain and palpitations.  Musculoskeletal: Denies myalgias   Neurological: Denies dizziness, headaches, weakness, lightheadedness     Objective:   Physical Exam Filed Vitals:   05/04/15 1324  BP: 123/71  Pulse: 68  Temp: 98.2 F (36.8 C)  TempSrc: Oral  Height:  5\' 2"  (1.575 m)  Weight: 161 lb 14.4 oz (73.437 kg)  SpO2: 99%   General: Vital signs reviewed. Patient is elderly, in no acute distress and cooperative with exam.  Cardiovascular: RRR, S1 normal, S2 normal, no murmurs, gallops, or rubs. Pulmonary/Chest: Clear to auscultation bilaterally, no wheezes, rales, or rhonchi. Extremities: No lower extremity edema bilaterally Psychiatric: Normal mood and affect. speech and behavior is normal. Cognition and memory are normal.      Assessment & Plan:   Please see problem based assessment and plan.

## 2015-05-05 NOTE — Progress Notes (Signed)
Internal Medicine Clinic Attending  Case discussed with Dr. Richardson at the time of the visit.  We reviewed the resident's history and exam and pertinent patient test results.  I agree with the assessment, diagnosis, and plan of care documented in the resident's note. 

## 2015-05-07 ENCOUNTER — Ambulatory Visit: Payer: Medicare Other

## 2015-05-12 ENCOUNTER — Ambulatory Visit: Payer: Medicare Other

## 2015-05-21 ENCOUNTER — Ambulatory Visit: Payer: Medicare Other

## 2015-05-21 NOTE — Therapy (Signed)
[name] [address] [place], [place], [address] Phone: [phone]   Fax:  [phone]  Patient Details  Name: Alexis Kline MRN: [id number] Date of Birth: November 30, 1945 Referring Provider:  No ref. provider found  Encounter Date: 05/21/2015  PHYSICAL THERAPY DISCHARGE SUMMARY  Visits from Start of Care: 2  Current functional level related to goals / functional outcomes:     PT Short Term Goals - 04/28/15 1609    PT SHORT TERM GOAL #1   Title Pt will be IND in HEP to improve balance, strength, flexibility, and endurance. Target date: 05/04/15.   Status On-going   PT SHORT TERM GOAL #2   Title Pt will verbalize fall prevention strategies to decr. falls risk. Target date: 05/04/15.   Status On-going         PT Long Term Goals - 04/28/15 1609    PT LONG TERM GOAL #1   Title Pt will improve DGI score to >/=20/24 to decr. falls risk. Target date: 06/01/15.   Status On-going   PT LONG TERM GOAL #2   Title Pt will ambulate 1000' over even/uneven terrain with LRAD at MOD I level to improve functional mobility. Target date: 06/01/15.   Status On-going   PT LONG TERM GOAL #3   Title Pt will amb. 400' indoors over even terrain, IND, to improve functional mobility. Target date: 06/01/15.   Status On-going   PT LONG TERM GOAL #4   Title Pt will report 0/10 dizziness during sit to stand and while turning to improve safety and quality of life. Target date: 06/01/15.   Status On-going        Remaining deficits: Unknown, as pt has not returned since last visit and cancelled all remaining appt's due to transportation issues and inability to stand long enough to wait for bus.   Education / Equipment:  Pt presented for initial eval and first treatment session ceased 2/2 incr. Diastolic BP. Therefore, no HEP provided. Plan: Patient agrees to discharge.  Patient goals were not met. Patient is being discharged due to  not returning since the last visit.  ?????      Rigby Leonhardt L 05/21/2015, 12:33 PM  [facility] [address] [place], [place], [address] Phone: [phone]   Fax:  [phone]   Geoffry Paradise, PT,DPT 05/21/2015 12:33 PM Phone: [phone] Fax: [phone]

## 2015-05-26 ENCOUNTER — Ambulatory Visit: Payer: Medicare Other

## 2015-06-02 ENCOUNTER — Ambulatory Visit: Payer: Medicare Other

## 2015-07-20 ENCOUNTER — Other Ambulatory Visit: Payer: Self-pay | Admitting: Internal Medicine

## 2015-07-21 ENCOUNTER — Other Ambulatory Visit: Payer: Self-pay | Admitting: Internal Medicine

## 2015-07-21 NOTE — Telephone Encounter (Signed)
Patien requesting a refill on her Coreg, Omeprazole, Pravastatin and Asprin

## 2015-07-22 MED ORDER — CARVEDILOL 12.5 MG PO TABS: 12.5000 mg | ORAL_TABLET | Freq: Two times a day (BID) | ORAL | Status: DC

## 2015-07-22 MED ORDER — OMEPRAZOLE 40 MG PO CPDR: 40.0000 mg | DELAYED_RELEASE_CAPSULE | Freq: Every day | ORAL | Status: DC

## 2015-07-22 MED ORDER — PRAVASTATIN SODIUM 40 MG PO TABS: 40.0000 mg | ORAL_TABLET | Freq: Every day | ORAL | Status: DC

## 2015-07-22 MED ORDER — ASPIRIN 81 MG PO TBEC: 81.0000 mg | DELAYED_RELEASE_TABLET | Freq: Every day | ORAL | Status: DC

## 2015-07-23 ENCOUNTER — Other Ambulatory Visit: Payer: Self-pay | Admitting: Internal Medicine

## 2015-07-31 ENCOUNTER — Other Ambulatory Visit: Payer: Self-pay | Admitting: Internal Medicine

## 2015-07-31 ENCOUNTER — Other Ambulatory Visit: Payer: Self-pay | Admitting: *Deleted

## 2015-07-31 DIAGNOSIS — G894 Chronic pain syndrome: Secondary | ICD-10-CM

## 2015-07-31 NOTE — Telephone Encounter (Signed)
Last 10/29/2014 Last visit 04/2015 Next visit 08/2015

## 2015-08-03 ENCOUNTER — Other Ambulatory Visit: Payer: Self-pay | Admitting: Internal Medicine

## 2015-08-05 ENCOUNTER — Telehealth: Payer: Self-pay | Admitting: Internal Medicine

## 2015-08-05 MED ORDER — HYDROCODONE-ACETAMINOPHEN 5-325 MG PO TABS: 1.0000 | ORAL_TABLET | Freq: Four times a day (QID) | ORAL | Status: DC | PRN

## 2015-08-05 NOTE — Telephone Encounter (Signed)
Called to inform rx ready for pick-up

## 2015-08-05 NOTE — Telephone Encounter (Signed)
Pt states Lidocaine need PA.

## 2015-08-05 NOTE — Telephone Encounter (Signed)
Ulis Rias- pa for lidocaine patch

## 2015-08-14 NOTE — Telephone Encounter (Signed)
Call made to [facility] to initiate PA-since pt does not have a diagnosis Post-herpetic neuralgia or diabetic neuropathy-request was sent for review.  Will contact pt and make her aware of the delay.Regenia Skeeter, Melodye Swor Cassady2/24/201711:01 AM     [facility] [phone] ID# [id number] PA reference # [id number]

## 2015-08-19 NOTE — Telephone Encounter (Addendum)
Received faxed documentation that request has been denied. Will send to pcp to make them aware.  Of note, pt has upcoming appt on 03/22/017-will discuss available options at that time.Regenia Skeeter, Darlene Cassady3/1/201710:58 AM

## 2015-09-09 ENCOUNTER — Encounter: Payer: Self-pay | Admitting: Internal Medicine

## 2015-09-09 ENCOUNTER — Ambulatory Visit (INDEPENDENT_AMBULATORY_CARE_PROVIDER_SITE_OTHER): Payer: Medicare Other | Admitting: Internal Medicine

## 2015-09-09 VITALS — BP 146/82 | HR 75 | Temp 97.9°F | Wt 161.7 lb

## 2015-09-09 DIAGNOSIS — G894 Chronic pain syndrome: Secondary | ICD-10-CM | POA: Diagnosis not present

## 2015-09-09 DIAGNOSIS — Z0000 Encounter for general adult medical examination without abnormal findings: Secondary | ICD-10-CM

## 2015-09-09 DIAGNOSIS — Z87891 Personal history of nicotine dependence: Secondary | ICD-10-CM

## 2015-09-09 DIAGNOSIS — E785 Hyperlipidemia, unspecified: Secondary | ICD-10-CM | POA: Diagnosis not present

## 2015-09-09 DIAGNOSIS — I2510 Atherosclerotic heart disease of native coronary artery without angina pectoris: Secondary | ICD-10-CM

## 2015-09-09 DIAGNOSIS — Z79899 Other long term (current) drug therapy: Secondary | ICD-10-CM | POA: Diagnosis not present

## 2015-09-09 DIAGNOSIS — I10 Essential (primary) hypertension: Secondary | ICD-10-CM

## 2015-09-09 DIAGNOSIS — Z7982 Long term (current) use of aspirin: Secondary | ICD-10-CM | POA: Diagnosis not present

## 2015-09-09 MED ORDER — ATORVASTATIN CALCIUM 40 MG PO TABS: 40.0000 mg | ORAL_TABLET | Freq: Every day | ORAL | Status: DC

## 2015-09-09 MED ORDER — HYDROCODONE-ACETAMINOPHEN 5-325 MG PO TABS: 1.0000 | ORAL_TABLET | Freq: Four times a day (QID) | ORAL | Status: DC | PRN

## 2015-09-09 NOTE — Assessment & Plan Note (Signed)
Patient reports history of CAD diagnosed in [place] with heart catheterization. She denies any chest pain, DOE or SOB. Patient reports compliance with ASA, pravastatin 40 mg daily, and Coreg 12.5 mg BID.  Plan: -ASA 81 mg daily -Coreg 12.5 mg BID -Change pravastatin to high intensity atorvastatin 40 mg daily

## 2015-09-09 NOTE — Progress Notes (Signed)
Subjective:    Patient ID: Alexis Kline, female    DOB: 29-May-1946, 70 y.o.   MRN: [id number]  HPI Alexis Kline is a 70 y.o. female with PMHx of CAD, HTN, lumbar radiculopathy who presents to the clinic for back pain. Please see A&P for the status of the patient's chronic medical problems.   Past Medical History  Diagnosis Date  . Hypertension   . HLD (hyperlipidemia)   . GERD (gastroesophageal reflux disease)   . COPD (chronic obstructive pulmonary disease) ([place])   . Hemorrhoids   . CAD (coronary artery disease) 2007    nonobstructive, 30% LAD 02/2006  . Fibroadenoma 2008    right breast - based on [place] 09/2006  . HYSTERECTOMY, HX OF 07/10/2006    Qualifier: Diagnosis of  By: Oretha Ellis    . APPENDECTOMY, HX OF 07/10/2006    Qualifier: Diagnosis of  By: Oretha Ellis    . Myocardial infarct, old     Outpatient Encounter Prescriptions as of 09/09/2015  Medication Sig  . aspirin 81 MG EC tablet Take 1 tablet (81 mg total) by mouth daily.  Marland Kitchen atorvastatin (LIPITOR) 40 MG tablet Take 1 tablet (40 mg total) by mouth daily.  . B Complex-Biotin-FA (SUPER B-COMPLEX PO) Take 1 tablet by mouth daily.    . carvedilol (COREG) 12.5 MG tablet Take 1 tablet (12.5 mg total) by mouth 2 (two) times daily with a meal.  . hydrochlorothiazide (HYDRODIURIL) 25 MG tablet TAKE 1 TABLET ONCE DAILY.  Marland Kitchen HYDROcodone-acetaminophen (NORCO/VICODIN) 5-325 MG tablet Take 1 tablet by mouth every 6 (six) hours as needed.  . lidocaine (LIDODERM) 5 % APPLY 1 PATCH TO SKIN ONCE DAILY. REMOVE AND DISCARD WITHIN 12 HOURS.12 HOURS ON/12 HOURS OFF  . nitroGLYCERIN (NITROSTAT) 0.4 MG SL tablet Place 1 tablet (0.4 mg total) under the tongue every 5 (five) minutes. up to 3 times as needed for chest pain  . omeprazole (PRILOSEC) 40 MG capsule Take 1 capsule (40 mg total) by mouth daily.  Marland Kitchen senna-docusate (SENNALAX-S) 8.6-50 MG tablet Take 2 tablets by mouth daily as needed (for constipation).  .  [DISCONTINUED] HYDROcodone-acetaminophen (NORCO/VICODIN) 5-325 MG tablet Take 1 tablet by mouth every 6 (six) hours as needed.  . [DISCONTINUED] pravastatin (PRAVACHOL) 40 MG tablet Take 1 tablet (40 mg total) by mouth daily.   No facility-administered encounter medications on file as of 09/09/2015.    Family History  Problem Relation Age of Onset  . Hyperlipidemia Mother   . Hypertension Mother   . Hyperlipidemia Father   . Hypertension Father   . Heart disease Sister   . Stroke Neg Hx   . Diabetes Neg Hx   . Colon cancer Neg Hx     Social History   Social History  . Marital Status: Widowed    Spouse Name: N/A  . Number of Children: N/A  . Years of Education: N/A   Occupational History  . Not on file.   Social History Main Topics  . Smoking status: Former Smoker -- 31 years    Types: Cigarettes    Quit date: 06/20/2013  . Smokeless tobacco: Never Used  . Alcohol Use: 0.0 oz/week    0 Standard drinks or equivalent per week     Comment: rarely "maybe a drink of New Year's"  . Drug Use: No  . Sexual Activity: Not Currently   Other Topics Concern  . Not on file   Social History Narrative   Review of  Systems General: Denies fatigue, change in appetite.  Respiratory: Denies SOB, cough, DOE, chest tightness.   Cardiovascular: Denies chest pain and palpitations.  Gastrointestinal: Denies diarrhea, constipation.  Musculoskeletal: Admits to chronic back pain. Denies myalgias and gait problem.  Skin: Denies rash and wounds.  Neurological: Denies dizziness, headaches, weakness, lightheadedness, numbness     Objective:   Physical Exam Filed Vitals:   09/09/15 1512 09/09/15 1612  BP: 176/89 146/82  Pulse: 76 75  Temp: 97.9 F (36.6 C)   TempSrc: Oral   Weight: 161 lb 11.2 oz (73.347 kg)   SpO2: 98%    General: Vital signs reviewed.  Patient is well-developed and well-nourished, in no acute distress and cooperative with exam.  Neck: No carotid  bruit. Cardiovascular: RRR, S1 normal, S2 normal, no murmurs, gallops, or rubs. Pulmonary/Chest: Clear to auscultation bilaterally, no wheezes, rales, or rhonchi. Abdominal: Soft, non-tender, non-distended, BS + Musculoskeletal: +Tenderness in low back Extremities: No lower extremity edema bilaterally Neurological: A&O x3, Strength is normal and symmetric bilaterally and sensory intact to light touch bilaterally.  Skin: Warm, dry and intact. No rashes or erythema.    Assessment & Plan:   Please see problem based assessment and plan.

## 2015-09-09 NOTE — Assessment & Plan Note (Signed)
Patient complains of chronic neck, back back from a traumatic accident after a 40 pound steel bar fell on her at work in [place]. 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 Pain is tolerable with Norco 5-325 mg #45 (filled every 9 months or so). Patient will take tylenol and advil as needed and Norco when pain is intolerable. She denies weakness or numbness.   Plan: -Refilled Norco 5-325 mg #45

## 2015-09-09 NOTE — Assessment & Plan Note (Signed)
Switched pravastatin to atorvastatin 40 mg daily.

## 2015-09-09 NOTE — Patient Instructions (Addendum)
FINISH TAKING PRAVASTATIN 40 MG DAILY THEN SWITCH TO ATORVASTATIN 40 MG DAILY ONCE YOUR RUN OUT.  CONTINUE ALL OTHER MEDICATIONS THE SAME.   WE WILL SCHEDULE YOUR BONE SCAN.   FOLLOW UP IN 3 MONTHS FOR BP RECHECK.

## 2015-09-09 NOTE — Assessment & Plan Note (Signed)
Pneumococcal Vaccine: 04/2015 DEXA: Needs to be scheduled Hepatitis C: States will get down next visit Colonoscopy: Repeat 2021 Mammogram: Normal 10/2014

## 2015-09-09 NOTE — Assessment & Plan Note (Signed)
BP Readings from Last 3 Encounters:  09/09/15 146/82  05/04/15 123/71  03/09/15 140/74    Lab Results  Component Value Date   NA 138 01/01/2014   K 3.6 01/01/2014   CREATININE 0.77 01/01/2014    Assessment: Blood pressure control: Controlled Progress toward BP goal:   Stable Comments: Compliant with Coreg 12.5 mg BID and HCTZ 25 mg daily.   Plan: Medications:  continue current medications

## 2015-09-10 NOTE — Progress Notes (Signed)
Internal Medicine Clinic Attending  Case discussed with Dr. Burns soon after the resident saw the patient.  We reviewed the resident's history and exam and pertinent patient test results.  I agree with the assessment, diagnosis, and plan of care documented in the resident's note. 

## 2015-09-28 ENCOUNTER — Other Ambulatory Visit: Payer: Self-pay

## 2015-09-28 DIAGNOSIS — Z1231 Encounter for screening mammogram for malignant neoplasm of breast: Secondary | ICD-10-CM

## 2015-10-02 ENCOUNTER — Other Ambulatory Visit: Payer: Medicare Other

## 2015-10-15 ENCOUNTER — Telehealth: Payer: Self-pay

## 2015-10-15 NOTE — Telephone Encounter (Signed)
Please call pt back.

## 2015-10-16 NOTE — Telephone Encounter (Signed)
Pt calls and states she cannot "hardly walk", states she has been this way for 3-4 days, offered an appt this pm, refused stating she will need to call ems for transportation, she is made aware that there is a long wait at ED today due to volume of emergency pts, she states she will need to come by ems, given ok. Ask to call for a f/u appt, she was agreeable

## 2015-11-06 ENCOUNTER — Ambulatory Visit
Admission: RE | Admit: 2015-11-06 | Discharge: 2015-11-06 | Disposition: A | Discharge status: Home / Self Care | Payer: Medicare Other | Source: Ambulatory Visit | Attending: Internal Medicine | Admitting: Internal Medicine

## 2015-11-06 ENCOUNTER — Ambulatory Visit
Admission: RE | Admit: 2015-11-06 | Discharge: 2015-11-06 | Disposition: A | Discharge status: Home / Self Care | Payer: Medicare Other | Source: Ambulatory Visit

## 2015-11-06 DIAGNOSIS — M810 Age-related osteoporosis without current pathological fracture: Secondary | ICD-10-CM

## 2015-11-06 DIAGNOSIS — Z1231 Encounter for screening mammogram for malignant neoplasm of breast: Secondary | ICD-10-CM | POA: Diagnosis not present

## 2015-11-06 DIAGNOSIS — Z780 Asymptomatic menopausal state: Secondary | ICD-10-CM | POA: Diagnosis not present

## 2015-11-10 ENCOUNTER — Other Ambulatory Visit: Payer: Self-pay | Admitting: Internal Medicine

## 2015-12-09 ENCOUNTER — Encounter: Payer: Medicare Other | Admitting: Internal Medicine

## 2016-02-03 ENCOUNTER — Encounter: Payer: Self-pay | Admitting: Internal Medicine

## 2016-02-03 ENCOUNTER — Ambulatory Visit (INDEPENDENT_AMBULATORY_CARE_PROVIDER_SITE_OTHER): Payer: Medicare Other | Admitting: Internal Medicine

## 2016-02-03 VITALS — BP 120/80 | HR 70 | Temp 97.8°F | Ht 62.0 in | Wt 160.9 lb

## 2016-02-03 DIAGNOSIS — M810 Age-related osteoporosis without current pathological fracture: Secondary | ICD-10-CM

## 2016-02-03 DIAGNOSIS — I10 Essential (primary) hypertension: Secondary | ICD-10-CM

## 2016-02-03 DIAGNOSIS — Z87891 Personal history of nicotine dependence: Secondary | ICD-10-CM | POA: Diagnosis not present

## 2016-02-03 DIAGNOSIS — Z79899 Other long term (current) drug therapy: Secondary | ICD-10-CM | POA: Diagnosis not present

## 2016-02-03 DIAGNOSIS — Z0000 Encounter for general adult medical examination without abnormal findings: Secondary | ICD-10-CM

## 2016-02-03 DIAGNOSIS — E785 Hyperlipidemia, unspecified: Secondary | ICD-10-CM

## 2016-02-03 DIAGNOSIS — I208 Other forms of angina pectoris: Secondary | ICD-10-CM | POA: Diagnosis not present

## 2016-02-03 DIAGNOSIS — Z7982 Long term (current) use of aspirin: Secondary | ICD-10-CM | POA: Diagnosis not present

## 2016-02-03 MED ORDER — ALENDRONATE SODIUM 10 MG PO TABS: 10.0000 mg | ORAL_TABLET | Freq: Every day | ORAL | 3 refills | Status: DC

## 2016-02-03 NOTE — Patient Instructions (Signed)
CONTINUE TAKING ALL MEDICATIONS AS PRESCRIBED.  START TAKING ALENDRONATE 10 MG DAILY IN THE MORNING. TAKE WITH A FULL GLASS OF WATER, ON AN EMPTY STOMACH, 30 MINUTES PRIOR TO EATING OR DRINKING.  FOLLOW UP IN 6 MONTHS.   Alendronate tablets What is this medicine? ALENDRONATE (a LEN droe nate) slows calcium loss from bones. It helps to make normal healthy bone and to slow bone loss in people with Paget's disease and osteoporosis. It may be used in others at risk for bone loss. This medicine may be used for other purposes; ask your health care provider or  How should I use this medicine? You must take this medicine exactly as directed or you will lower the amount of the medicine you absorb into your body or you may cause yourself harm. Take this medicine by mouth first thing in the morning, after you are up for the day. Do not eat or drink anything before you take your medicine. Swallow the tablet with a full glass (6 to 8 fluid ounces) of plain water. Do not take this medicine with any other drink. Do not chew or crush the tablet. After taking this medicine, do not eat breakfast, drink, or take any medicines or vitamins for at least 30 minutes. Sit or stand up for at least 30 minutes after you take this medicine; do not lie down. Do not take your medicine more often than directed. Overdosage: If you think you have taken too much of this medicine contact a poison control center or emergency room at once. NOTE: This medicine is only for you. Do not share this medicine with others. What if I miss a dose? If you miss a dose, do not take it later in the day. Continue your normal schedule starting the next morning. Do not take double or extra doses.

## 2016-02-03 NOTE — Assessment & Plan Note (Addendum)
DEXA scan revealed femoral T score of -2.8 consistent with osteoporosis. Diagnosis was discussed with patient today and she reports that she is "not surprised." Patient is agreeable to starting a bisphosphonate. Patient quit smoking in 2014.  Plan: -Alendronate 10 mg QAM -Educated on proper way to take medication

## 2016-02-03 NOTE — Progress Notes (Signed)
    CC: HTN  HPI: [name] is a 70 y.o. female with PMHx of CAD, HTN who presents to the clinic for follow up for HTN. Please see problem oriented charting for more information.   Past Medical History:  Diagnosis Date  . APPENDECTOMY, HX OF 07/10/2006   Qualifier: Diagnosis of  By: Oretha Ellis    . CAD (coronary artery disease) 2007   nonobstructive, 30% LAD 02/2006  . COPD (chronic obstructive pulmonary disease) ([place])   . 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 GERD (gastroesophageal reflux disease)   . Hemorrhoids   . HLD (hyperlipidemia)   . Hypertension   . HYSTERECTOMY, HX OF 07/10/2006   Qualifier: Diagnosis of  By: Oretha Ellis    . Myocardial infarct, old     Review of Systems: A complete ROS was negative except as noted in HPI.   Physical Exam: Vitals:   02/03/16 1546 02/03/16 1628  BP: (!) 150/91 120/80  Pulse: 78 70  Temp: 97.8 F (36.6 C)   TempSrc: Oral   SpO2: 100%   Weight: 160 lb 14.4 oz (73 kg)   Height: 5\' 2"  (1.575 m)    General: Vital signs reviewed.  Patient is chronically ill appearing, in no acute distress and cooperative with exam.   Neck: Supple, trachea midline, no carotid bruit present.  Cardiovascular: RRR, S1 normal, S2 normal, no murmurs, gallops, or rubs. Pulmonary/Chest: Clear to auscultation bilaterally, no wheezes, rales, or rhonchi. Abdominal: Soft, non-tender, non-distended, BS + Extremities: No lower extremity edema bilaterally Skin: +Petechiae  Assessment & Plan:  See encounters tab for problem based medical decision making. Patient discussed with Dr. Evette Doffing

## 2016-02-03 NOTE — Assessment & Plan Note (Signed)
BP Readings from Last 3 Encounters:  02/03/16 120/80  09/09/15 (!) 146/82  05/04/15 123/71    Lab Results  Component Value Date   NA 138 01/01/2014   K 3.6 01/01/2014   CREATININE 0.77 01/01/2014    Assessment: Blood pressure control:   Controlled Progress toward BP goal:   At goal Comments: Compliant with Coreg 12.5 mg BID and HCTZ 25 mg daily  Plan: Medications:  continue current medications Other plans: consider addition of ACEI at follow up visit for CAD

## 2016-02-03 NOTE — Assessment & Plan Note (Signed)
Patient has a history of CAD with chronic stable angina which is infrequent and occurs with exertion. Patient reports compliance with ASA 81 mg daily, Coreg 12.5 mg BID, Atorvastatin 40 mg daily, and nitro prn.   Plan: -Continue above medications -Consider addition of ACEI at follow up visit- it does not appear patient has been on ACEI or ARB in the past -May need to titrate HCTZ down

## 2016-02-03 NOTE — Assessment & Plan Note (Signed)
Patient reports compliance with atorvastatin 40 mg daily.

## 2016-02-04 ENCOUNTER — Encounter: Payer: Self-pay | Admitting: Internal Medicine

## 2016-02-04 LAB — CBC
Hematocrit: 38.7 % (ref 34.0–46.6)
Hemoglobin: 12.2 g/dL (ref 11.1–15.9)
MCH: 23.6 pg — ABNORMAL LOW (ref 26.6–33.0)
MCHC: 31.5 g/dL (ref 31.5–35.7)
MCV: 75 fL — ABNORMAL LOW (ref 79–97)
Platelets: 275 10*3/uL (ref 150–379)
RBC: 5.16 x10E6/uL (ref 3.77–5.28)
RDW: 16 % — ABNORMAL HIGH (ref 12.3–15.4)
WBC: 7.5 10*3/uL (ref 3.4–10.8)

## 2016-02-04 LAB — BMP8+ANION GAP
Anion Gap: 17 mmol/L (ref 10.0–18.0)
BUN/Creatinine Ratio: 14 (ref 12–28)
BUN: 12 mg/dL (ref 8–27)
CO2: 25 mmol/L (ref 18–29)
Calcium: 9.8 mg/dL (ref 8.7–10.3)
Chloride: 100 mmol/L (ref 96–106)
Creatinine, Ser: 0.84 mg/dL (ref 0.57–1.00)
GFR calc Af Amer: 82 mL/min/{1.73_m2} (ref >59)
GFR calc non Af Amer: 71 mL/min/{1.73_m2} (ref >59)
Glucose: 91 mg/dL (ref 65–99)
Potassium: 4 mmol/L (ref 3.5–5.2)
Sodium: 142 mmol/L (ref 134–144)

## 2016-02-04 NOTE — Progress Notes (Signed)
Internal Medicine Clinic Attending  Case discussed with Dr. Burns soon after the resident saw the patient.  We reviewed the resident's history and exam and pertinent patient test results.  I agree with the assessment, diagnosis, and plan of care documented in the resident's note. 

## 2016-02-18 ENCOUNTER — Telehealth: Payer: Self-pay | Admitting: *Deleted

## 2016-02-18 ENCOUNTER — Ambulatory Visit (INDEPENDENT_AMBULATORY_CARE_PROVIDER_SITE_OTHER): Payer: Medicare Other | Admitting: Internal Medicine

## 2016-02-18 VITALS — BP 161/93 | HR 86 | Temp 97.8°F | Ht 62.0 in | Wt 161.0 lb

## 2016-02-18 DIAGNOSIS — M810 Age-related osteoporosis without current pathological fracture: Secondary | ICD-10-CM

## 2016-02-18 DIAGNOSIS — M791 Myalgia: Secondary | ICD-10-CM | POA: Diagnosis present

## 2016-02-18 DIAGNOSIS — Z87891 Personal history of nicotine dependence: Secondary | ICD-10-CM | POA: Diagnosis not present

## 2016-02-18 DIAGNOSIS — R1319 Other dysphagia: Secondary | ICD-10-CM

## 2016-02-18 DIAGNOSIS — M436 Torticollis: Secondary | ICD-10-CM | POA: Diagnosis not present

## 2016-02-18 DIAGNOSIS — G894 Chronic pain syndrome: Secondary | ICD-10-CM

## 2016-02-18 MED ORDER — CYCLOBENZAPRINE HCL 10 MG PO TABS: 10.0000 mg | ORAL_TABLET | Freq: Three times a day (TID) | ORAL | 0 refills | Status: DC | PRN

## 2016-02-18 MED ORDER — KETOROLAC TROMETHAMINE 60 MG/2ML IM SOLN
60.0000 mg | Freq: Once | INTRAMUSCULAR | Status: AC
  Administered 2016-02-18: 60 mg via INTRAMUSCULAR

## 2016-02-18 NOTE — Progress Notes (Signed)
   CC: neck muscle spasms, and odynophagia after starting fosamax HPI: Ms.Nykerria A Shakoor is a 70 y.o. woman with PMH noted below here for neck muscle spasms, generalized malaise, and odynophagia after starting fosamax  Please see Problem List/A&P for the status of the patient's chronic medical problems   Past Medical History:  Diagnosis Date  . APPENDECTOMY, HX OF 07/10/2006   Qualifier: Diagnosis of  By: Oretha Ellis    . CAD (coronary artery disease) 2007   nonobstructive, 30% LAD 02/2006  . COPD (chronic obstructive pulmonary disease) ([place])   . 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 GERD (gastroesophageal reflux disease)   . Hemorrhoids   . HLD (hyperlipidemia)   . Hypertension   . HYSTERECTOMY, HX OF 07/10/2006   Qualifier: Diagnosis of  By: Oretha Ellis    . Myocardial infarct, old     Review of Systems: Denies fevers, chills. Feels fatigued after starting fosamax  Denies cough, SOB Had some nausea, denies vomiting Has odynophagia after starting fosamax, and muscle spasm of her neck going up the head Has some myalgias  Physical Exam: Vitals:   02/18/16 1134  BP: (!) 161/93  Pulse: 86  Temp: 97.8 F (36.6 C)  TempSrc: Oral  SpO2: 100%  Weight: 161 lb (73 kg)  Height: 5\' 2"  (1.575 m)    General: A&O, in NAD, in wheelchair Neck: tense muscles in neck, and latisimus dorsi , no lymphadenopathy Resp: CTAB, no wheezing Abd: soft nontender nondistended Extremities: no edema   Assessment & Plan:   See encounters tab for problem based medical decision making. Patient seen with Dr. Eppie Gibson

## 2016-02-18 NOTE — Telephone Encounter (Signed)
Pt states she's in a lot of pain - unable to raises arms;increase pain since Sunday. Talked w/ Dr Eppie Gibson -  pt needs to be seen today. Pt state daughter on her to her house (20 mins) then will take about 5 mins to get here. Appt given in [facility] @ 1045 AM slot.

## 2016-02-18 NOTE — Patient Instructions (Signed)
  Thank you for your visit today  Please take the flexeril up to 3 times a day as needed for your muscle spasm  Please be careful as this will make you drowsy and dizzy so please have assistance when you walk, or get up from the bed.  Please stop the fosamax

## 2016-02-18 NOTE — Telephone Encounter (Signed)
Fosamax is well known to cause esophageal problems.  I can not rule out an esophageal cause to the described symptom complex.  Please schedule her for an appointment in the acute care clinic today.  If you have to add a slot, please do so.  Thanks.

## 2016-02-18 NOTE — Telephone Encounter (Signed)
Call from pt - states having reaction from Fosamax which she started taking on the 18th.. Having nausea, weakness, difficulty swallowing, neck stiffness (trouble turning neck side to side),pain - neck,shoulers,back. States symptoms did not start until she began taking this medication. Please advise.

## 2016-02-18 NOTE — Assessment & Plan Note (Signed)
Patient was here after she started experiencing stiffness of her neck muscles, and odynophagia after starting Fosamax. She started this medicine on Aug 18th, and then her symptoms got more worse since the 27th, so on the 29th, she stopped taking fosamax. She says that it is hard for her to turn her neck, and her muscles feel stiff. She also experiences difficulty swallowing  And some nausea. Her pain shoots up from the neck to her head, and to both shoulders. She describes more of a sharp pain than burning pain. She describes this flare up of pain and odynophagia as different from her routine flare ups in her lower back as she has lumbar spine degenerative changes. She denies acute injury or trauma, or falls. Fosamax is known to cause reflux, esophagitis, and ulcers. Pt denies any reflux symptoms and she is already on omeprazole. On exam, neck muscles were stiff to palpation. No other tender points on exam, though it is possible she may have FM. She tried taking vicodin this morning but it did not help.    Assessment: most likely medication induced odynophagia, and diffuse body pain   Plan -asked her to stop the Fosamax. Discuss other modalities for osteoporosis rx at PCP visit -given an injection of Toradol in the clinic for symptomatic relief -given flexeril TID PRN for muscle spasms -Pt will follow up with PCP

## 2016-02-19 NOTE — Progress Notes (Signed)
I saw and evaluated the patient.  I personally confirmed the key portions of Dr. Cherene Altes history and exam and reviewed pertinent patient test results.  The assessment, diagnosis, and plan were formulated together and I agree with the documentation in the resident's note.  After examing the patient I am not convinced this is a side effect of the Fosamax.  I believe it is more of a muscular problem than an esophageal problem.  That being said, because of the timing we have held the Fosamax and focused our therapeutic interventions on the muscular pain and inflammation.  She may be a candidate to reintroduce Fosamax in the future and may better tolerate weekly, rather than daily dosing.

## 2016-03-03 ENCOUNTER — Ambulatory Visit (INDEPENDENT_AMBULATORY_CARE_PROVIDER_SITE_OTHER): Payer: Medicare Other | Admitting: Internal Medicine

## 2016-03-03 VITALS — BP 152/92 | HR 81 | Temp 97.8°F | Ht 62.0 in | Wt 160.7 lb

## 2016-03-03 DIAGNOSIS — Z7982 Long term (current) use of aspirin: Secondary | ICD-10-CM

## 2016-03-03 DIAGNOSIS — M62838 Other muscle spasm: Secondary | ICD-10-CM | POA: Diagnosis not present

## 2016-03-03 DIAGNOSIS — M810 Age-related osteoporosis without current pathological fracture: Secondary | ICD-10-CM

## 2016-03-03 DIAGNOSIS — Z87891 Personal history of nicotine dependence: Secondary | ICD-10-CM

## 2016-03-03 DIAGNOSIS — M47812 Spondylosis without myelopathy or radiculopathy, cervical region: Secondary | ICD-10-CM | POA: Insufficient documentation

## 2016-03-03 MED ORDER — DIAZEPAM 2 MG PO TABS: 2.0000 mg | ORAL_TABLET | Freq: Four times a day (QID) | ORAL | 0 refills | Status: DC | PRN

## 2016-03-03 NOTE — Assessment & Plan Note (Addendum)
Continues to have spasm of left cervical paraspinal muscles, continuous, painful, unclear etiology. Patient suspects that Fosamax was to blame but this was discontinued on 8/31 at previous visit and her spasm has not resolved. She has tried Tylenol, Ibuprofen, hot/cold packs with minimal transient effectiveness. Flexeril helped remove the msucle tightness from the right side of her neck since last visit but nothing more. Vicodin resolves the pain but the muscle spasm persists. On exam there is paraspinal tenderness to palpation and tense musculature along neck/cervical spine from shoulder to occiput on the left side, with decreased neck range of motion. These symptoms are consistent with reactive spasm due to inflammation from local degenerative joint disease vs compression fracture in setting of osteroporosis.  Plan: - XR cervical spine - Stop flexeril, provided two week course of Valium 2mg  Q6H prn - Continue Advil, hot/cold packs prn - Follow up symptoms in 2 weeks

## 2016-03-03 NOTE — Assessment & Plan Note (Signed)
Patient very reluctant to restart Fosamax, despite no relief of neck spasm despite stopping the medication on 8/31.   Plan: - Attempt to restart Fosamax at future visit, opt for weekly dosing - If refused pursue alternative treatments for osteoporosis

## 2016-03-03 NOTE — Progress Notes (Signed)
   CC: Neck muscle spasm and pain  HPI:  [name] is a 70 y.o. female with PMHx detailed below presenting with ongoing left-sided cervical paraspinal muscle spasm and associated stiffness and pain. This pain has not improved since stopping Fosamax at previous visit, and remains refractory to Tylenol, Ibuprofen, Flexeril, Vicodin, and hot/cold packs.  See problem based assessment and plan below for additional details.  Past Medical History:  Diagnosis Date  . APPENDECTOMY, HX OF 07/10/2006   Qualifier: Diagnosis of  By: Oretha Ellis    . CAD (coronary artery disease) 2007   nonobstructive, 30% LAD 02/2006  . COPD (chronic obstructive pulmonary disease) ([place])   . 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 GERD (gastroesophageal reflux disease)   . Hemorrhoids   . HLD (hyperlipidemia)   . Hypertension   . HYSTERECTOMY, HX OF 07/10/2006   Qualifier: Diagnosis of  By: Oretha Ellis    . Myocardial infarct, old     Review of Systems: Review of Systems  Constitutional: Negative for chills and fever.  Respiratory: Negative for shortness of breath.   Cardiovascular: Negative for chest pain.  Gastrointestinal: Negative for abdominal pain.  Musculoskeletal: Positive for back pain and neck pain. Negative for falls.  Neurological: Positive for sensory change. Negative for dizziness, tingling and focal weakness.     Physical Exam: Vitals:   03/03/16 1618  BP: (!) 152/92  Pulse: 81  Temp: 97.8 F (36.6 C)  TempSrc: Oral  SpO2: 98%  Weight: 160 lb 11.2 oz (72.9 kg)  Height: 5\' 2"  (1.575 m)   Body mass index is 29.39 kg/m. GENERAL- Elderly woman sitting in exam room chair, alert, somewhat disheveled, appears uncomfortable HEENT- Atraumatic, moist mucous membranes CARDIAC- Regular rate and rhythm, no murmurs, rubs or gallops. RESP- Clear to ascultation bilaterally, no wheezing or crackles, normal work of breathing BACK- Kyphotic curvature,  paraspinal tenderness and tense musculature along length of left side of cervical spine NEURO- Alert and oriented, cranial nerves grossly intact, strenght and sensation grossly intact,  EXTREMITIES- Normal bulk, decreased neck and left shoulder range of motion,  no edema, 2+ peripheral pulses SKIN- Warm, dry, intact, without visible rash PSYCH- Appropriate affect, clear speech, thoughts linear and goal-directed  Assessment & Plan:   See encounters tab for problem based medical decision making.  Patient seen with Dr. Eppie Gibson

## 2016-03-03 NOTE — Patient Instructions (Addendum)
Please continue to take your medications as prescribed.  Stop taking the Cyclobenzaprine (Flexeril) Start taking Valium 2mg , one tablet up to every 6 hours as needed for muscle spasm.   Make sure to get your neck X-ray before leaving the hospital today.  Thank you for visiting up today.

## 2016-03-04 ENCOUNTER — Telehealth: Payer: Self-pay | Admitting: Internal Medicine

## 2016-03-04 ENCOUNTER — Ambulatory Visit (HOSPITAL_COMMUNITY)
Admission: RE | Admit: 2016-03-04 | Discharge: 2016-03-04 | Disposition: A | Discharge status: Home / Self Care | Payer: Medicare Other | Source: Ambulatory Visit | Attending: Internal Medicine | Admitting: Internal Medicine

## 2016-03-04 DIAGNOSIS — M47812 Spondylosis without myelopathy or radiculopathy, cervical region: Secondary | ICD-10-CM | POA: Insufficient documentation

## 2016-03-04 DIAGNOSIS — M810 Age-related osteoporosis without current pathological fracture: Secondary | ICD-10-CM | POA: Insufficient documentation

## 2016-03-04 NOTE — Telephone Encounter (Signed)
   Reason for call:   I received a call from Ms. [name] at 11:00 PM that she was concerned about whether or not she could drink orange while she is taking Valium as she read that she should avoid grapefruit juice.   Pertinent Data:   Patient recently prescribed Valium 2 mg q6h prn for spasm of her left cervical paraspinal muscles.   Assessment / Plan / Recommendations:   Patient advised that it is okay to drink orange juice while she is on Valium.  Patient will continue to avoid grapefruit juice in close proximity to Valium doses.  She seems to understand and all questions are answered.  As always, pt is advised that if symptoms worsen or new symptoms arise, they should go to an urgent care facility or to to ER for further evaluation.   Zada Finders, MD   03/04/2016, 11:10 PM

## 2016-03-08 NOTE — Progress Notes (Signed)
Patient ID: Alexis Kline, female   DOB: 02/23/1946, 70 y.o.   MRN: [id number]  I saw and evaluated the patient.  I personally confirmed the key portions of Dr. Durenda Age history and exam and reviewed pertinent patient test results.  The assessment, diagnosis, and plan were formulated together and I agree with the documentation in the resident's note.

## 2016-03-23 ENCOUNTER — Telehealth: Payer: Self-pay | Admitting: Internal Medicine

## 2016-03-23 NOTE — Telephone Encounter (Signed)
APT. REMINDER CALL, LMTCB °

## 2016-03-24 ENCOUNTER — Ambulatory Visit (INDEPENDENT_AMBULATORY_CARE_PROVIDER_SITE_OTHER): Payer: Medicare Other | Admitting: Pulmonary Disease

## 2016-03-24 ENCOUNTER — Encounter: Payer: Self-pay | Admitting: Pulmonary Disease

## 2016-03-24 DIAGNOSIS — M62838 Other muscle spasm: Secondary | ICD-10-CM | POA: Diagnosis not present

## 2016-03-24 DIAGNOSIS — Z23 Encounter for immunization: Secondary | ICD-10-CM

## 2016-03-24 DIAGNOSIS — Z7982 Long term (current) use of aspirin: Secondary | ICD-10-CM | POA: Diagnosis not present

## 2016-03-24 DIAGNOSIS — I10 Essential (primary) hypertension: Secondary | ICD-10-CM | POA: Diagnosis not present

## 2016-03-24 DIAGNOSIS — M542 Cervicalgia: Secondary | ICD-10-CM

## 2016-03-24 DIAGNOSIS — Z79899 Other long term (current) drug therapy: Secondary | ICD-10-CM

## 2016-03-24 DIAGNOSIS — M810 Age-related osteoporosis without current pathological fracture: Secondary | ICD-10-CM | POA: Diagnosis not present

## 2016-03-24 DIAGNOSIS — Z87891 Personal history of nicotine dependence: Secondary | ICD-10-CM

## 2016-03-24 NOTE — Progress Notes (Signed)
   CC: neck pain  HPI:  [name] is a 70 y.o. woman presenting for follow up of her neck pain.  She reports she has had 25% improvement. She has trouble turning her head to the left. Able to flex and extend. She is taking diazepam at night - makes her sleepy. She has tried ice/heat and uses ibuprofen. Has not tried any acupuncture, chiropractor, massage therapy. Now has been going on for the past month. Sleeps on almost flat pillow.   Past Medical History:  Diagnosis Date  . APPENDECTOMY, HX OF 07/10/2006   Qualifier: Diagnosis of  By: Oretha Ellis    . CAD (coronary artery disease) 2007   nonobstructive, 30% LAD 02/2006  . COPD (chronic obstructive pulmonary disease) ([place])   . 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 GERD (gastroesophageal reflux disease)   . Hemorrhoids   . HLD (hyperlipidemia)   . Hypertension   . HYSTERECTOMY, HX OF 07/10/2006   Qualifier: Diagnosis of  By: Oretha Ellis    . Myocardial infarct, old     Review of Systems:   No fevers, chills, night sweats, weight loss, chest pain, or shortness of breath.  Physical Exam:  Vitals:   03/24/16 1503 03/24/16 1547  BP: (!) 151/86 (!) 146/89  Pulse: 80 82  Temp: 97.8 F (36.6 C)   TempSrc: Oral   SpO2: 99%   Weight: 162 lb 4.8 oz (73.6 kg)   Height: 5\' 2"  (1.575 m)    General Apperance: NAD HEENT: Normocephalic, atraumatic, anicteric sclera Neck: Tender to palpation along paraspinal cervical muscles, limited range of motion due to pain on rotation to left greater than right Lungs: Clear to auscultation bilaterally. No wheezes, rhonchi or rales. Breathing comfortably Heart: Regular rate and rhythm, no murmur/rub/gallop Abdomen: Soft, nontender, nondistended, no rebound/guarding Extremities: Warm and well perfused, no edema Skin: No rashes or lesions Neurologic: Alert and interactive. No gross deficits.   Assessment & Plan:   See Encounters Tab for problem  based charting.  Patient seen with Dr. Angelia Mould

## 2016-03-24 NOTE — Patient Instructions (Signed)
You may follow up as needed for you neck pain. If you are interested in the muscle manipulation, ask for OMT or specifically to be with a DO physician in our clinic.  Things you may do to help your osteoporosis: -Calcium vitamin D: make sure you have an adequate amount in your diet. Calcium from dietary intake alone should be about 1200 mg daily and 800 international units of vitamin D daily -Exercise (prudently) for at least 30 minutes three times per week -Avoid smoking/tobacco

## 2016-03-26 NOTE — Assessment & Plan Note (Signed)
Assessment: Previously controlled at visit with PCP. Suspect she is outside goal at 146/89 today due to her neck pain.  Plan: Continue Carvedilol 12.5mg  BID Continue HCTZ 25mg  daily Recheck at follow up with PCP

## 2016-03-26 NOTE — Assessment & Plan Note (Signed)
Assessment: spasm of cervical paraspinal muscles. Her Valium makes her sleepy and she does not notice much muscle relaxing. Overall has improved by 25%.  OMT by Dr. Johnnette Gourd performed today. She reports large improvement in her pain and range of motion following OMT.   Plan: Follow up as needed Further OMT as needed. May consider referral to physical therapy.

## 2016-03-26 NOTE — Assessment & Plan Note (Signed)
Assessment: She is unwilling to restart Fosamax because she is afraid it will cause her to have neck pain again. I discussed with her infusion options as this would avoid a pill and she is unwilling to try those. Is interested in nonpharmacologic intervention.  Plan: Discussed with her exercise at least 3 times a week for 30 minutes each Discussed goal amounts of vitamin D and calcium dietary intake Will defer discussion of other pharmacologic interventions (teriparatide, denosumab, or SERMs) to her PCP especially when her cervical pain has improved more or resolved completely.

## 2016-03-29 NOTE — Progress Notes (Signed)
Internal Medicine Clinic Attending  I saw and evaluated the patient.  I personally confirmed the key portions of the history and exam documented by Dr. Randell Patient and I reviewed pertinent patient test results.  The assessment, diagnosis, and plan were formulated together and I agree with the documentation in the resident's note. On exam she had tenderness of bilateral cervical paraspinal muscles as well as trapezius.  She had limited ROM of head rotation to 30 degrees bilaterally.  She was agreeable for OMT.  I treated her C2 with muscle energy bilaterally with improvement to 60 degrees of rotation bilaterally.  I also preformed OA decompression and FPR to the cervical spine.  After these treatments she reported great improvement in her neck pain and ROM.  I instructed her to continue stretching exercises and she can follow up with myself or one of other other DOs if she wished to continue OMT treatment.

## 2016-03-31 ENCOUNTER — Telehealth: Payer: Self-pay | Admitting: Internal Medicine

## 2016-03-31 NOTE — Telephone Encounter (Signed)
APT. REMINDER CALL, LMTCB °

## 2016-04-01 ENCOUNTER — Ambulatory Visit: Payer: Medicare Other

## 2016-06-06 ENCOUNTER — Other Ambulatory Visit: Payer: Self-pay | Admitting: Internal Medicine

## 2016-07-08 ENCOUNTER — Other Ambulatory Visit: Payer: Self-pay | Admitting: Internal Medicine

## 2016-08-03 ENCOUNTER — Encounter: Payer: Medicare Other | Admitting: Internal Medicine

## 2016-09-21 ENCOUNTER — Other Ambulatory Visit: Payer: Self-pay | Admitting: Internal Medicine

## 2016-09-21 DIAGNOSIS — Z1231 Encounter for screening mammogram for malignant neoplasm of breast: Secondary | ICD-10-CM

## 2016-10-06 ENCOUNTER — Other Ambulatory Visit: Payer: Self-pay | Admitting: Internal Medicine

## 2016-10-06 DIAGNOSIS — I2510 Atherosclerotic heart disease of native coronary artery without angina pectoris: Secondary | ICD-10-CM

## 2016-10-19 ENCOUNTER — Ambulatory Visit (HOSPITAL_COMMUNITY)
Admission: RE | Admit: 2016-10-19 | Discharge: 2016-10-19 | Disposition: A | Discharge status: Home / Self Care | Payer: Medicare Other | Source: Ambulatory Visit | Attending: Family Medicine | Admitting: Family Medicine

## 2016-10-19 ENCOUNTER — Ambulatory Visit (INDEPENDENT_AMBULATORY_CARE_PROVIDER_SITE_OTHER): Payer: Medicare Other | Admitting: Internal Medicine

## 2016-10-19 ENCOUNTER — Encounter: Payer: Self-pay | Admitting: Internal Medicine

## 2016-10-19 VITALS — BP 122/69 | HR 66 | Temp 98.1°F | Ht 62.0 in | Wt 163.4 lb

## 2016-10-19 DIAGNOSIS — E785 Hyperlipidemia, unspecified: Secondary | ICD-10-CM | POA: Diagnosis not present

## 2016-10-19 DIAGNOSIS — Z79899 Other long term (current) drug therapy: Secondary | ICD-10-CM | POA: Diagnosis not present

## 2016-10-19 DIAGNOSIS — S161XXS Strain of muscle, fascia and tendon at neck level, sequela: Secondary | ICD-10-CM | POA: Diagnosis not present

## 2016-10-19 DIAGNOSIS — I2089 Other forms of angina pectoris: Secondary | ICD-10-CM

## 2016-10-19 DIAGNOSIS — M810 Age-related osteoporosis without current pathological fracture: Secondary | ICD-10-CM

## 2016-10-19 DIAGNOSIS — G894 Chronic pain syndrome: Secondary | ICD-10-CM

## 2016-10-19 DIAGNOSIS — I209 Angina pectoris, unspecified: Secondary | ICD-10-CM | POA: Diagnosis not present

## 2016-10-19 DIAGNOSIS — Z87891 Personal history of nicotine dependence: Secondary | ICD-10-CM | POA: Diagnosis not present

## 2016-10-19 DIAGNOSIS — W208XXS Other cause of strike by thrown, projected or falling object, sequela: Secondary | ICD-10-CM | POA: Diagnosis not present

## 2016-10-19 DIAGNOSIS — I208 Other forms of angina pectoris: Secondary | ICD-10-CM

## 2016-10-19 DIAGNOSIS — Z7982 Long term (current) use of aspirin: Secondary | ICD-10-CM

## 2016-10-19 DIAGNOSIS — I10 Essential (primary) hypertension: Secondary | ICD-10-CM

## 2016-10-19 DIAGNOSIS — M5412 Radiculopathy, cervical region: Secondary | ICD-10-CM

## 2016-10-19 MED ORDER — NITROGLYCERIN 0.4 MG SL SUBL: 0.4000 mg | SUBLINGUAL_TABLET | SUBLINGUAL | 0 refills | Status: DC

## 2016-10-19 MED ORDER — HYDROCODONE-ACETAMINOPHEN 5-325 MG PO TABS: 1.0000 | ORAL_TABLET | Freq: Every day | ORAL | 0 refills | Status: DC | PRN

## 2016-10-19 MED ORDER — ALENDRONATE SODIUM 70 MG PO TABS: 70.0000 mg | ORAL_TABLET | ORAL | 3 refills | Status: DC

## 2016-10-19 NOTE — Assessment & Plan Note (Signed)
Patient has a history of chronic cervical radiculopathy and muscle strain from a traumatic injury 20 years ago when a beam fell on her head. She uses hydrocodone very infrequently. She uses less than 45 pills per year. Patient requests a refill. Her last prescription was in March 2017.  Plan: -Refilled hydrocodone 5-325 milligrams daily when necessary -Consider pain contract at follow-up visit

## 2016-10-19 NOTE — Patient Instructions (Signed)
Alexis Kline,  For your heart, we will refer you back to cardiology. Please continue to take all of your medications as instructed. If you develop recurrent chest pain, please go to the emergency department. This can often present is stress chest tightness. You can take nitroglycerin if this occurs.  Take alendronate 70 mg 1 tablet once a week every week. Take this medication first thing in the morning with a full glass of water. You must remain upright either sitting or standing for 30 minutes after he takes the tablet. Please do not eat anything or take any other medications until 30 minutes after you take alendronate. If you develop any burning in your esophagus, painful swallowing, or pain in your jaw, please call our clinic. Please remember to follow with a dentist once a year.

## 2016-10-19 NOTE — Assessment & Plan Note (Addendum)
Patient has a history of chronic stable angina that previously had no change in her symptoms over several years, in fact she felt that her symptoms were improving. Patient reports compliance with aspirin 81 mg daily, atorvastatin 40 mg daily, carvedilol 12.5 mg twice a day, and nitroglycerin when necessary. Patient states that 2 weeks ago she experienced an episode of bandlike chest pressure around her upper chest without radiation that occurred at rest and lasted for 15-20 minutes and resolved on its own. It was associated with shortness of breath as she felt like she could not catch her breath at that time. She denies any associated diaphoresis, nausea, vomiting, lightheadedness. She did not take a nitroglycerin. She states she had an episode like this years ago. This sensation was different from her symptoms of her chronic stable angina which usually involves chest pain that occurs with activity but resolves with rest or nitroglycerin. Patient has not had a heart catheterization nor has she seen cardiology in over 6 years. She denies any recurrent symptoms since this episode 2 weeks ago. I'm concerned that these symptoms may have been ischemic in nature. We will check an EKG in our clinic today, but I will refer her to cardiology for consideration for further evaluation. We will also check a repeat lipid panel to assess her ASCVD risk score.  Plan: -Refer to cardiology for consideration of further evaluation of previously stable angina now with symptoms of progression -EKG -Lipid panel -Continue aspirin 81 mg daily -Continue atorvastatin 40 mg daily -Continue carvedilol 12.5 mg twice a day -Continue nitroglycerin as needed  Addendum: EKG is normal sinus rhythm. No evidence of ST elevation or depression, no T-wave inversions or other signs concerning for ischemia. No old Q waves to indicate prior infarction.

## 2016-10-19 NOTE — Assessment & Plan Note (Addendum)
Patient reports compliance with atorvastatin 40 mg daily. We will repeat a lipid panel today to recalculate ASCVD risk score.  Plan: -Continue atorvastatin 40 mg daily -Lipid panel  ADDENDUM: ASCVD Risk 11.2%. Patient is on high intensity statin and ASA therapy. Continue. I called patient to discuss results.

## 2016-10-19 NOTE — Progress Notes (Signed)
    CC: Follow-up for hypertension  HPI: [name] is a 71 y.o. female with PMHx of hypertension, chronic stable angina, osteoporosis, hyperlipidemia who presents to the clinic for follow-up for hypertension.  Patient states that 2 weeks ago she experienced an episode of bandlike chest pressure around her upper chest without radiation that occurred at rest and lasted for 15-20 minutes and resolved on its own. It was associated with shortness of breath as she felt like she could not catch her breath at that time. She denies any associated diaphoresis, nausea, vomiting, lightheadedness. She did not take a nitroglycerin. She states she had an episode like this years ago. This sensation was different from her symptoms of her chronic stable angina which usually involves chest pain that occurs with activity but resolves with rest or nitroglycerin. Patient has not had a heart catheterization nor has she seen cardiology in over 6 years. She denies any recurrent symptoms since this episode 2 weeks ago.  Patient continues to have bilateral neck pain radiating into her shoulders. She states that heating pads and pain medication help with the pain. This is her chronic cervical and muscular pain from a traumatic injury 20 years ago.    Please see problem base assessment and plan for more information.   Past Medical History:  Diagnosis Date  . APPENDECTOMY, HX OF 07/10/2006   Qualifier: Diagnosis of  By: Oretha Ellis    . CAD (coronary artery disease) 2007   nonobstructive, 30% LAD 02/2006  . COPD (chronic obstructive pulmonary disease) ([place])   . 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 GERD (gastroesophageal reflux disease)   . Hemorrhoids   . HLD (hyperlipidemia)   . Hypertension   . HYSTERECTOMY, HX OF 07/10/2006   Qualifier: Diagnosis of  By: Oretha Ellis    . Myocardial infarct, old     Review of Systems: Please see pertinent ROS reviewed in HPI and  problem based charting.   Physical Exam: Vitals:   10/19/16 1327  BP: 122/69  Pulse: 66  Temp: 98.1 F (36.7 C)  TempSrc: Oral  SpO2: 98%  Weight: 163 lb 6.4 oz (74.1 kg)  Height: 5\' 2"  (1.575 m)   General: Vital signs reviewed.  Patient is Elderly, appears younger than stated age, in no acute distress and cooperative with exam.  Cardiovascular: RRR, S1 normal, S2 normal, no murmurs, gallops, or rubs. No carotid bruit. No cyanosis or clubbing. No lower extremity edema bilaterally. Pulmonary/Chest: Clear to auscultation bilaterally, no wheezes, rales, or rhonchi. Abdominal: Soft, non-tender, non-distended, BS + Musculoskeletal: Neck has decreased ROM.  tenderness throughout trapezius musculature bilaterally Neurological: Awake, alert, oriented. Moving all extremities equally. Sensation intact. Skin: Warm, dry and intact. No rashes or erythema. Psychiatric: Normal mood and affect. speech and behavior is normal. Cognition and memory are normal.   Assessment & Plan:  See encounters tab for problem based medical decision making. Patient discussed with Dr. Beryle Beams

## 2016-10-19 NOTE — Assessment & Plan Note (Addendum)
Patient has a history of osteoporosis after a DEXA scan showed a femoral T score of -2.8. Patient was started on Fosamax, but subsequently developed symptoms consistent with muscle strain in her cervical area. However, given the recent start to Fosamax, there was concern that this was a side effect from the medication. Patient was instructed to stop the medication. However, when patient followed up in the clinic, she continued to have symptoms of muscle strain. Cervical x-ray was negative. Patient was prescribed Valium 2 mg every 6 hours as needed. She was told that she could restart the Fosamax, but due to fear of her symptoms worsening, patient refused. She then returned again for follow-up appointment and received osteopathic manipulative treatment with some improvement in pain. Patient however continued to reluctant to restart Fosamax. Instead, she was instructed to exercise 3 times weekly and continue vitamin D and calcium supplementation. After discussion today, patient is willing to restart alendronate weekly.  Plan: -Continue vitamin D and calcium supplementation -Continue exercise -Alendronate 70 mg every week -Patient counseled on proper way to take the medication and we talked about concerning symptoms such as odynophagia, dysphagia, painful jaw. -Patient should follow with a dentist yearly.

## 2016-10-19 NOTE — Progress Notes (Signed)
Medicine attending: Medical history, presenting problems, physical findings, and medications, reviewed with resident physician Dr Alexa Burns on the day of the patient visit and I concur with her evaluation and management plan. 

## 2016-10-19 NOTE — Assessment & Plan Note (Addendum)
BP Readings from Last 3 Encounters:  10/19/16 122/69  03/24/16 (!) 146/89  03/03/16 (!) 152/92    Lab Results  Component Value Date   NA 142 02/03/2016   K 4.0 02/03/2016   CREATININE 0.84 02/03/2016    Assessment: Blood pressure control:  controlled Progress toward BP goal:   at goal Comments: Patient reports compliance with carvedilol 12.5 mg and hydrochlorothiazide 25 mg daily.  Plan: Medications:  continue current medications Other plans: Follow up in 3 months, repeat basic metabolic panel   Addendum: Basic metabolic panel normal, I have called patient to discuss the results.

## 2016-10-20 LAB — BMP8+ANION GAP
Anion Gap: 16 mmol/L (ref 10.0–18.0)
BUN/Creatinine Ratio: 25 (ref 12–28)
BUN: 16 mg/dL (ref 8–27)
CO2: 24 mmol/L (ref 18–29)
Calcium: 9.6 mg/dL (ref 8.7–10.3)
Chloride: 100 mmol/L (ref 96–106)
Creatinine, Ser: 0.64 mg/dL (ref 0.57–1.00)
GFR calc Af Amer: 105 mL/min/{1.73_m2} (ref >59)
GFR calc non Af Amer: 91 mL/min/{1.73_m2} (ref >59)
Glucose: 98 mg/dL (ref 65–99)
Potassium: 3.7 mmol/L (ref 3.5–5.2)
Sodium: 140 mmol/L (ref 134–144)

## 2016-10-20 LAB — LIPID PANEL
Chol/HDL Ratio: 3.6 ratio (ref 0.0–4.4)
Cholesterol, Total: 154 mg/dL (ref 100–199)
HDL: 43 mg/dL (ref >39)
LDL Calculated: 60 mg/dL (ref 0–99)
Triglycerides: 256 mg/dL — ABNORMAL HIGH (ref 0–149)
VLDL Cholesterol Cal: 51 mg/dL — ABNORMAL HIGH (ref 5–40)

## 2016-10-26 ENCOUNTER — Ambulatory Visit (INDEPENDENT_AMBULATORY_CARE_PROVIDER_SITE_OTHER): Payer: Medicare Other | Admitting: Cardiovascular Disease

## 2016-10-26 ENCOUNTER — Encounter: Payer: Self-pay | Admitting: Cardiovascular Disease

## 2016-10-26 VITALS — BP 142/82 | HR 65 | Ht 62.0 in | Wt 162.4 lb

## 2016-10-26 DIAGNOSIS — I2510 Atherosclerotic heart disease of native coronary artery without angina pectoris: Secondary | ICD-10-CM

## 2016-10-26 DIAGNOSIS — Z79899 Other long term (current) drug therapy: Secondary | ICD-10-CM

## 2016-10-26 DIAGNOSIS — Z87891 Personal history of nicotine dependence: Secondary | ICD-10-CM | POA: Diagnosis not present

## 2016-10-26 DIAGNOSIS — E782 Mixed hyperlipidemia: Secondary | ICD-10-CM

## 2016-10-26 DIAGNOSIS — I10 Essential (primary) hypertension: Secondary | ICD-10-CM

## 2016-10-26 DIAGNOSIS — I25119 Atherosclerotic heart disease of native coronary artery with unspecified angina pectoris: Secondary | ICD-10-CM | POA: Diagnosis not present

## 2016-10-26 DIAGNOSIS — I208 Other forms of angina pectoris: Secondary | ICD-10-CM

## 2016-10-26 DIAGNOSIS — I209 Angina pectoris, unspecified: Secondary | ICD-10-CM | POA: Diagnosis not present

## 2016-10-26 LAB — CBC
HCT: 40.8 % (ref 35.0–45.0)
Hemoglobin: 12.7 g/dL (ref 11.7–15.5)
MCH: 25 pg — ABNORMAL LOW (ref 27.0–33.0)
MCHC: 31.1 g/dL — ABNORMAL LOW (ref 32.0–36.0)
MCV: 80.2 fL (ref 80.0–100.0)
MPV: 10.4 fL (ref 7.5–12.5)
Platelets: 292 10*3/uL (ref 140–400)
RBC: 5.09 MIL/uL (ref 3.80–5.10)
RDW: 15.7 % — ABNORMAL HIGH (ref 11.0–15.0)
WBC: 7.9 10*3/uL (ref 3.8–10.8)

## 2016-10-26 LAB — TSH: TSH: 1.49 mIU/L

## 2016-10-26 MED ORDER — CARVEDILOL 12.5 MG PO TABS: ORAL_TABLET | ORAL | 3 refills | Status: DC

## 2016-10-26 NOTE — Progress Notes (Signed)
Cardiology Office Note    Date:  10/28/2016   ID:  Alexis Kline, DOB Mar 10, 1946, MRN [id number]  PCP:  Florinda Marker, MD  Cardiologist:  Shelva Majestic, MD   Chief Complaint  Patient presents with  . New Patient (Initial Visit)    History of Present Illness:  Alexis Kline is a 71 y.o. female is referred through the courtesy of Dr. Martyn Malay for evaluation of chest pain.  [name] a greater than 30 year history of hypertension, a history of hyperlipidemia, as well as GERD.  She also has a history of osteopenia.  Recently, she developed a bandlike chest pressure around her upper chest without radiation that occurred at rest and lasted for 15-20 minutes with spontaneous resolution.  Her symptoms were also associated with shortness of breath and she felt that she could not catch her breath.  She denies any classic exertional symptomatology.  Reportedly also has a history of chronic stable angina, and remotely had undergone cardiac catheterization.  In 2012 .  She was found to have 30% areas of segmental disease in her LAD.  Smoking cessation was advised.  She was treated medically.  She has a history of bilateral neck pain radiating to her shoulders and has chronic cervical of musculoskeletal discomfort from traumatic injury over 20 years ago.  She was recently evaluated by Dr. Quay Burow and with her recent chest pain symptomatology she is now referred for cardiology evaluation.  Of note, the patient states he drinks at least 8 cups of coffee per day.  She admits to occasional palpitations.   Past Medical History:  Diagnosis Date  . APPENDECTOMY, HX OF 07/10/2006   Qualifier: Diagnosis of  By: Oretha Ellis    . CAD (coronary artery disease) 2007   nonobstructive, 30% LAD 02/2006  . COPD (chronic obstructive pulmonary disease) ([place])   . 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 GERD (gastroesophageal reflux disease)   . Hemorrhoids   . HLD  (hyperlipidemia)   . Hypertension   . HYSTERECTOMY, HX OF 07/10/2006   Qualifier: Diagnosis of  By: Oretha Ellis    . Myocardial infarct, old     Past Surgical History:  Procedure Laterality Date  . ABDOMINAL HYSTERECTOMY  1988  . APPENDECTOMY  1966  . BLADDER SUSPENSION  1997  . BREAST LUMPECTOMY     right breat  . [place]   3 operations in right hand and 2 operations in left hand. Dr. Redmond Pulling in [place]  . PTCA  2007  . rectal stapling  2008   Done by Dr. Marlou Starks  . RECTAL SURGERY  01/07/2007   Dr. Marlou Starks in [place].   Mar[facility] Kitchen SHOULDER SURGERY  1991   Right shoulder surgery in 1991 and left shoulder surgery in 1992 by Dr. Redmond Pulling in [place].    Current Medications: Outpatient Medications Prior to Visit  Medication Sig Dispense Refill  . aspirin 81 MG EC tablet Take 1 tablet (81 mg total) by mouth daily. 90 tablet 3  . atorvastatin (LIPITOR) 40 MG tablet TAKE 1 TABLET ONCE DAILY. 90 tablet 3  . B Complex-Biotin-FA (SUPER B-COMPLEX PO) Take 1 tablet by mouth daily.      . hydrochlorothiazide (HYDRODIURIL) 25 MG tablet TAKE 1 TABLET ONCE DAILY. 90 tablet 3  . HYDROcodone-acetaminophen (NORCO/VICODIN) 5-325 MG tablet Take 1 tablet by mouth daily as needed for moderate pain. 45 tablet 0  .  nitroGLYCERIN (NITROSTAT) 0.4 MG SL tablet Place 1 tablet (0.4 mg total) under the tongue every 5 (five) minutes. up to 3 times as needed for chest pain 30 tablet 0  . omeprazole (PRILOSEC) 40 MG capsule TAKE (1) CAPSULE DAILY. 90 capsule 3  . senna-docusate (SENNALAX-S) 8.6-50 MG tablet Take 2 tablets by mouth daily as needed (for constipation). 60 tablet 5  . carvedilol (COREG) 12.5 MG tablet TAKE 1 TABLET TWICE DAILY WITH A MEAL. 180 tablet 3  . alendronate (FOSAMAX) 70 MG tablet Take 1 tablet (70 mg total) by mouth once a week. Take with a full glass of water on an empty stomach. 12 tablet 3  . lidocaine (LIDODERM) 5 % APPLY 1 PATCH TO SKIN ONCE DAILY. REMOVE AND DISCARD  WITHIN 12 HOURS.12 HOURS ON/12 HOURS OFF 30 patch 5   No facility-administered medications prior to visit.      Allergies:   [name] isothiocyanate]; Iodine; Other; and Tape   Social History   Social History  . Marital status: Widowed    Spouse name: N/A  . Number of children: N/A  . Years of education: N/A   Social History Main Topics  . Smoking status: Former Smoker    Years: 54.00    Types: Cigarettes    Quit date: 06/20/2013  . Smokeless tobacco: Never Used  . Alcohol use 0.0 oz/week     Comment: rarely "maybe a drink of New Year's"  . Drug use: No  . Sexual activity: Not Currently   Other Topics Concern  . None   Social History Narrative  . 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  She is widowed for 19 years.  She has 2 children, 4 grandchildren, and 11 great grandchildren.  She lives by herself.  She has 2 years of college.  She retired in 1970.  She previously had smoked for 55 years.  She does walk approximately 3-4 days per week.  Family History:  The patient's family history includes Heart disease in her sister; Hyperlipidemia in her father and mother; Hypertension in her father and mother.   ROS General: Negative; No fevers, chills, or night sweats;  HEENT: Negative; No changes in vision or hearing, sinus congestion, difficulty swallowing Pulmonary: Negative; No cough, wheezing, shortness of breath, hemoptysis Cardiovascular: Negative; No chest pain, presyncope, syncope, palpitations GI: Negative; No nausea, vomiting, diarrhea, or abdominal pain GU: Negative; No dysuria, hematuria, or difficulty voiding Musculoskeletal: Chronic neck and arm pain since her remote trauma. Hematologic/Oncology: Negative; no easy bruising, bleeding Endocrine: Negative; no heat/cold intolerance; no diabetes Neuro: Negative; no changes in balance, headaches Skin: Negative; No rashes or skin lesions Psychiatric: Negative; No  behavioral problems, depression Sleep: Negative; No snoring, daytime sleepiness, hypersomnolence, bruxism, restless legs, hypnogognic hallucinations, no cataplexy Other comprehensive 14 point system review is negative.   PHYSICAL EXAM:   VS:  BP (!) 142/82   Pulse 65   Ht '5\' 2"'$  (1.575 m)   Wt 162 lb 6.4 oz (73.7 kg)   LMP 06/20/1986   BMI 29.70 kg/m     Repeat blood pressure by me was 134/80. Wt Readings from Last 3 Encounters:  10/26/16 162 lb 6.4 oz (73.7 kg)  10/19/16 163 lb 6.4 oz (74.1 kg)  03/24/16 162 lb 4.8 oz (73.6 kg)    General: Alert, oriented, no distress.  Skin: normal turgor, no rashes, warm and dry HEENT: Normocephalic, atraumatic. Pupils equal round and reactive to  light; sclera anicteric; extraocular muscles intact; Fundi Mild arteriolar narrowing.  No hemorrhages or exudates.  Disks flat Nose without nasal septal hypertrophy Mouth/Parynx benign; Mallinpatti scale 1 Neck: No JVD, no carotid bruits; normal carotid upstroke Lungs: clear to ausculatation and percussion; no wheezing or rales Chest wall: without tenderness to palpitation Heart: PMI not displaced, RRR, s1 s2 normal, 1/6 systolic murmur, no diastolic murmur, no rubs, gallops, thrills, or heaves Abdomen: soft, nontender; no hepatosplenomehaly, BS+; abdominal aorta nontender and not dilated by palpation. Back: no CVA tenderness Pulses 2+ Musculoskeletal: full range of motion, normal strength, no joint deformities Extremities: no clubbing cyanosis or edema, Homan's sign negative  Neurologic: grossly nonfocal; Cranial nerves grossly wnl Psychologic: Normal mood and affect   Studies/Labs Reviewed:   EKG:  EKG is ordered today. ECG (independently read by me): Normal sinus rhythm at 65 bpm.  Normal intervals.  No ectopy.  No significant ST-T changes.  Recent Labs: BMP Latest Ref Rng & Units 10/19/2016 02/03/2016 01/01/2014  Glucose 65 - 99 mg/dL 98 91 89  BUN 8 - 27 mg/dL '16 12 10  '$ Creatinine 0.57 - 1.00  mg/dL 0.64 0.84 0.77  BUN/Creat Ratio 12 - '28 25 14 '$ -  Sodium 134 - 144 mmol/L 140 142 138  Potassium 3.5 - 5.2 mmol/L 3.7 4.0 3.6  Chloride 96 - 106 mmol/L 100 100 103  CO2 18 - 29 mmol/L '24 25 28  '$ Calcium 8.7 - 10.3 mg/dL 9.6 9.8 9.3     Hepatic Function Latest Ref Rng & Units 01/02/2014 07/28/2010 01/14/2009  Total Protein 6.0 - 8.3 g/dL 6.6 6.8 7.6  Albumin 3.5 - 5.2 g/dL 3.8 4.0 4.3  AST 0 - 37 U/L '27 18 28  '$ ALT 0 - 35 U/L '16 9 18  '$ Alk Phosphatase 39 - 117 U/L 49 55 63  Total Bilirubin 0.2 - 1.2 mg/dL 0.4 0.4 0.5  Bilirubin, Direct 0.0 - 0.3 mg/dL 0.1 - -    CBC Latest Ref Rng & Units 10/26/2016 02/03/2016 01/02/2014  WBC 3.8 - 10.8 K/uL 7.9 7.5 5.7  Hemoglobin 11.7 - 15.5 g/dL 12.7 - 11.4(L)  Hematocrit 35.0 - 45.0 % 40.8 38.7 35.7(L)  Platelets 140 - 400 K/uL 292 275 295   Lab Results  Component Value Date   MCV 80.2 10/26/2016   MCV 75 (L) 02/03/2016   MCV 71.3 (L) 01/02/2014   Lab Results  Component Value Date   TSH 1.49 10/26/2016   No results found for: HGBA1C   BNP No results found for: BNP  ProBNP No results found for: PROBNP   Lipid Panel     Component Value Date/Time   CHOL 154 10/19/2016 1401   TRIG 256 (H) 10/19/2016 1401   HDL 43 10/19/2016 1401   CHOLHDL 3.6 10/19/2016 1401   CHOLHDL 3.8 01/01/2014 1433   VLDL 38 01/01/2014 1433   LDLCALC 60 10/19/2016 1401     RADIOLOGY: No results found.   Additional studies/ records that were reviewed today include:  I reviewed the patient's prior cardiac catheterization from 11/03/2010.  I reviewed recent office records.  Laboratory was reviewed.    ASSESSMENT:    1. Essential hypertension   2. Medication management   3. Stable angina pectoris ([place])   4. Coronary artery disease involving native coronary artery of native heart with angina pectoris ([place])   5. Mixed hyperlipidemia   6. History of tobacco abuse      PLAN:  Ms. Patsey Berthold is a 71 year old female has a  prior 63 year history of  tobacco use, a greater than 30 year history of hypertension, as well as a history of hyperlipidemia and mild nonobstructive coronary artery disease noted a cardiac catheterization in May 2012.  She was felt to have chronic stable angina.  Recently, she has experienced recurrent episodes of nonexertional chest pressure which he states is like a band squeezing on her chest.  She also has noticed intermittent palpitations but admits to pressure today is upper normal.  On her medical regimen consisting of carvedilol 12.5 g twice a day, HCTZ 25 mg.  I have recommended further titration of carvedilol to 18.75 mg twice a day.  I'm scheduling her for an echo Doppler study to assess systolic and diastolic function.  With her CAD history and chest pain.  I'm also scheduling her for a [place] study for further evaluation.  Her lipid studies reveal increased triglycerides and I have suggested fish oil 2 capsules daily for initial therapy.  We discussed improved diet with reference to carbohydrate and sweet intake.  I have recommended a follow-up thyroid function studies and CBC since these have not been recently done.  I have recommended reduction in caffeine intake.  I will see her back in the office   Medication Adjustments/Labs and Tests Ordered: Current medicines are reviewed at length with the patient today.  Concerns regarding medicines are outlined above.  Medication changes, Labs and Tests ordered today are listed in the Patient Instructions below. Patient Instructions  Medication Instructions:   The carvedilol has been increased to 1 & 1/2 tablet twice a day. A new prescription has been sent to your pharmacy to reflect this change.  Labwork:  CBC, TSH  Testing/Procedures:  Your physician has requested that you have an echocardiogram. Echocardiography is a painless test that uses sound waves to create images of your heart. It provides your doctor with information about the size and shape of your  heart and how well your heart's chambers and valves are working. This procedure takes approximately one hour. There are no restrictions for this procedure.  Your physician has requested that you have a lexiscan myoview. For further information please visit [email or web address]. Please follow instruction sheet, as given.    Follow-Up:  June with Dr Claiborne Billings  Any Other Special Instructions Will Be Listed Below (If Applicable).     Signed, Shelva Majestic, MD  10/28/2016 8:17 PM    [facility] [address], [place], [place], [place]  [address] Phone: [phone]

## 2016-10-26 NOTE — Patient Instructions (Signed)
Medication Instructions:   The carvedilol has been increased to 1 & 1/2 tablet twice a day. A new prescription has been sent to your pharmacy to reflect this change.  Labwork:  CBC, TSH  Testing/Procedures:  Your physician has requested that you have an echocardiogram. Echocardiography is a painless test that uses sound waves to create images of your heart. It provides your doctor with information about the size and shape of your heart and how well your heart's chambers and valves are working. This procedure takes approximately one hour. There are no restrictions for this procedure.  Your physician has requested that you have a lexiscan myoview. For further information please visit [email or web address]. Please follow instruction sheet, as given.    Follow-Up:  June with Dr Claiborne Billings  Any Other Special Instructions Will Be Listed Below (If Applicable).

## 2016-10-27 ENCOUNTER — Telehealth (HOSPITAL_COMMUNITY): Payer: Self-pay

## 2016-10-27 NOTE — Telephone Encounter (Signed)
Encounter complete. 

## 2016-11-01 ENCOUNTER — Ambulatory Visit (HOSPITAL_COMMUNITY)
Admission: RE | Admit: 2016-11-01 | Discharge: 2016-11-01 | Disposition: A | Discharge status: Home / Self Care | Payer: Medicare Other | Source: Ambulatory Visit | Attending: Cardiology | Admitting: Cardiology

## 2016-11-01 DIAGNOSIS — R002 Palpitations: Secondary | ICD-10-CM | POA: Diagnosis not present

## 2016-11-01 DIAGNOSIS — Z8249 Family history of ischemic heart disease and other diseases of the circulatory system: Secondary | ICD-10-CM | POA: Diagnosis not present

## 2016-11-01 DIAGNOSIS — I208 Other forms of angina pectoris: Secondary | ICD-10-CM

## 2016-11-01 DIAGNOSIS — I25118 Atherosclerotic heart disease of native coronary artery with other forms of angina pectoris: Secondary | ICD-10-CM | POA: Insufficient documentation

## 2016-11-01 DIAGNOSIS — R5383 Other fatigue: Secondary | ICD-10-CM | POA: Insufficient documentation

## 2016-11-01 DIAGNOSIS — Z87891 Personal history of nicotine dependence: Secondary | ICD-10-CM | POA: Insufficient documentation

## 2016-11-01 DIAGNOSIS — I2510 Atherosclerotic heart disease of native coronary artery without angina pectoris: Secondary | ICD-10-CM | POA: Diagnosis present

## 2016-11-01 DIAGNOSIS — I10 Essential (primary) hypertension: Secondary | ICD-10-CM

## 2016-11-01 DIAGNOSIS — K219 Gastro-esophageal reflux disease without esophagitis: Secondary | ICD-10-CM | POA: Diagnosis not present

## 2016-11-01 LAB — MYOCARDIAL PERFUSION IMAGING
LV dias vol: 69 mL (ref 46–106)
LV sys vol: 13 mL
Peak HR: 94 {beats}/min
Rest HR: 60 {beats}/min
SDS: 0
SRS: 0
SSS: 0
TID: 1.03

## 2016-11-01 MED ORDER — AMINOPHYLLINE 25 MG/ML IV SOLN
75.0000 mg | Freq: Once | INTRAVENOUS | Status: AC
  Administered 2016-11-01: 75 mg via INTRAVENOUS

## 2016-11-01 MED ORDER — REGADENOSON 0.4 MG/5ML IV SOLN
0.4000 mg | Freq: Once | INTRAVENOUS | Status: AC
  Administered 2016-11-01: 0.4 mg via INTRAVENOUS

## 2016-11-01 MED ORDER — TECHNETIUM TC 99M TETROFOSMIN IV KIT
31.9000 | PACK | Freq: Once | INTRAVENOUS | Status: AC | PRN
  Administered 2016-11-01: 31.9 via INTRAVENOUS
  Filled 2016-11-01: qty 32

## 2016-11-01 MED ORDER — TECHNETIUM TC 99M TETROFOSMIN IV KIT
9.9000 | PACK | Freq: Once | INTRAVENOUS | Status: AC | PRN
  Administered 2016-11-01: 9.9 via INTRAVENOUS
  Filled 2016-11-01: qty 10

## 2016-11-08 ENCOUNTER — Ambulatory Visit
Admission: RE | Admit: 2016-11-08 | Discharge: 2016-11-08 | Disposition: A | Discharge status: Home / Self Care | Payer: Medicare Other | Source: Ambulatory Visit | Attending: Family Medicine | Admitting: Family Medicine

## 2016-11-08 DIAGNOSIS — Z1231 Encounter for screening mammogram for malignant neoplasm of breast: Secondary | ICD-10-CM

## 2016-11-09 ENCOUNTER — Other Ambulatory Visit: Payer: Self-pay

## 2016-11-09 ENCOUNTER — Ambulatory Visit (HOSPITAL_COMMUNITY): Payer: Medicare Other | Attending: Cardiovascular Disease

## 2016-11-09 DIAGNOSIS — I10 Essential (primary) hypertension: Secondary | ICD-10-CM | POA: Insufficient documentation

## 2016-11-09 DIAGNOSIS — I208 Other forms of angina pectoris: Secondary | ICD-10-CM | POA: Insufficient documentation

## 2016-11-11 ENCOUNTER — Telehealth: Payer: Self-pay | Admitting: Cardiovascular Disease

## 2016-11-11 NOTE — Telephone Encounter (Signed)
°  Follow Up   Following up on most recent echocardiogram test results. Please call.

## 2016-11-11 NOTE — Telephone Encounter (Signed)
Spoke with pt, aware dr Claiborne Billings has not resulted studies yet.

## 2016-11-21 NOTE — Telephone Encounter (Signed)
Patient has appointment to discuss tomorrow. 11/22/16.

## 2016-11-22 ENCOUNTER — Encounter: Payer: Self-pay | Admitting: Cardiovascular Disease

## 2016-11-22 ENCOUNTER — Ambulatory Visit (INDEPENDENT_AMBULATORY_CARE_PROVIDER_SITE_OTHER): Payer: Medicare Other | Admitting: Cardiovascular Disease

## 2016-11-22 VITALS — BP 134/82 | HR 79 | Ht 62.0 in | Wt 162.0 lb

## 2016-11-22 DIAGNOSIS — Z87891 Personal history of nicotine dependence: Secondary | ICD-10-CM

## 2016-11-22 DIAGNOSIS — E782 Mixed hyperlipidemia: Secondary | ICD-10-CM

## 2016-11-22 DIAGNOSIS — I10 Essential (primary) hypertension: Secondary | ICD-10-CM

## 2016-11-22 DIAGNOSIS — I2510 Atherosclerotic heart disease of native coronary artery without angina pectoris: Secondary | ICD-10-CM | POA: Diagnosis not present

## 2016-11-22 DIAGNOSIS — I25119 Atherosclerotic heart disease of native coronary artery with unspecified angina pectoris: Secondary | ICD-10-CM | POA: Diagnosis not present

## 2016-11-22 DIAGNOSIS — K219 Gastro-esophageal reflux disease without esophagitis: Secondary | ICD-10-CM | POA: Diagnosis not present

## 2016-11-22 DIAGNOSIS — I209 Angina pectoris, unspecified: Secondary | ICD-10-CM

## 2016-11-22 NOTE — Progress Notes (Signed)
Cardiology Office Note    Date:  11/24/2016   ID:  Alexis Kline, DOB 1946/05/30, MRN [id number]  PCP:  Florinda Marker, MD  Cardiologist:  Shelva Majestic, MD   Chief Complaint  Patient presents with  . Follow-up  . Shortness of Breath  . Headache    History of Present Illness:  Alexis Kline is a 71 y.o. female who wa referred through the courtesy of Dr. Martyn Kline for evaluation of chest pain.  Saw her for initial evaluation on 10/26/2016.  She presents for follow-up evaluation.  [name]  has a greater than 30 year history of hypertension, a history of hyperlipidemia, as well as GERD.  She also has a history of osteopenia.  Recently, she developed a bandlike chest pressure around her upper chest without radiation that occurred at rest and lasted for 15-20 minutes with spontaneous resolution.  Her symptoms were also associated with shortness of breath and she felt that she could not catch her breath.  She denies any classic exertional symptomatology.  Reportedly also has a history of chronic stable angina, and remotely had undergone cardiac catheterization.  In 2012 .  She was found to have 30% areas of segmental disease in her LAD.  Smoking cessation was advised.  She was treated medically.  She has a history of bilateral neck pain radiating to her shoulders and has chronic cervical of musculoskeletal discomfort from traumatic injury over 20 years ago.  She was recently evaluated by Dr. Quay Kline and with her recent chest pain symptomatology she is now referred for cardiology evaluation.  Of note, the patient states she drinks at least 8 cups of coffee per day.  She admits to occasional palpitations.  When I initially saw her, we discussed significant reduction in caffeine intake.  Her blood pressure was upper normal in width.  Her chest pain.  I recommended titration of carvedilol to 18.75 mg twice a day.  She underwent an echo Doppler study on 11/09/2016 which showed  hyperdynamic LV function with an EF of 65-70%.  There was grade 1 diastolic dysfunction.  There was mild mitral annular calcification.  A nuclear perfusion study showed an EF of 81%.  There were no ECG changes during stress.  She had normal perfusion.  Laboratory showed a hemoglobin of 12.7 and hematocrit of 40.8.  TSH was normal at 1.49.  She presents for reevaluation.   Past Medical History:  Diagnosis Date  . APPENDECTOMY, HX OF 07/10/2006   Qualifier: Diagnosis of  By: Oretha Ellis    . CAD (coronary artery disease) 2007   nonobstructive, 30% LAD 02/2006  . COPD (chronic obstructive pulmonary disease) ([place])   . 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 GERD (gastroesophageal reflux disease)   . Hemorrhoids   . HLD (hyperlipidemia)   . Hypertension   . HYSTERECTOMY, HX OF 07/10/2006   Qualifier: Diagnosis of  By: Oretha Ellis    . Myocardial infarct, old     Past Surgical History:  Procedure Laterality Date  . ABDOMINAL HYSTERECTOMY  1988  . APPENDECTOMY  1966  . BLADDER SUSPENSION  1997  . BREAST EXCISIONAL BIOPSY Right 1988  . BREAST LUMPECTOMY     right breat  . [place]   3 operations in right hand and 2 operations in left hand. Dr. Redmond Pulling in [place]  . PTCA  2007  . rectal stapling  2008   Done  by Dr. Carolynne Edouard  . RECTAL SURGERY  01/07/2007   Dr. Carolynne Edouard in [place].   Marland Kitchen SHOULDER SURGERY  1991   Right shoulder surgery in 1991 and left shoulder surgery in 1992 by Dr. Andrey Campanile in [place].    Current Medications: Outpatient Medications Prior to Visit  Medication Sig Dispense Refill  . aspirin 81 MG EC tablet Take 1 tablet (81 mg total) by mouth daily. 90 tablet 3  . atorvastatin (LIPITOR) 40 MG tablet TAKE 1 TABLET ONCE DAILY. 90 tablet 3  . B Complex-Biotin-FA (SUPER B-COMPLEX PO) Take 1 tablet by mouth daily.      . carvedilol (COREG) 12.5 MG tablet Take 1 & 1/2 tablet twice a day 90 tablet 3  . hydrochlorothiazide  (HYDRODIURIL) 25 MG tablet TAKE 1 TABLET ONCE DAILY. 90 tablet 3  . HYDROcodone-acetaminophen (NORCO/VICODIN) 5-325 MG tablet Take 1 tablet by mouth daily as needed for moderate pain. 45 tablet 0  . nitroGLYCERIN (NITROSTAT) 0.4 MG SL tablet Place 1 tablet (0.4 mg total) under the tongue every 5 (five) minutes. up to 3 times as needed for chest pain 30 tablet 0  . omeprazole (PRILOSEC) 40 MG capsule TAKE (1) CAPSULE DAILY. 90 capsule 3  . senna-docusate (SENNALAX-S) 8.6-50 MG tablet Take 2 tablets by mouth daily as needed (for constipation). 60 tablet 5   No facility-administered medications prior to visit.      Allergies:   Alexis Kline isothiocyanate]; Iodine; Other; and Tape   Social History   Social History  . Marital status: Widowed    Spouse name: N/A  . Number of children: N/A  . Years of education: N/A   Social History Main Topics  . Smoking status: Former Smoker    Years: 54.00    Types: Cigarettes    Quit date: 06/20/2013  . Smokeless tobacco: Never Used  . Alcohol use 0.0 oz/week     Comment: rarely "maybe a drink of New Year's"  . Drug use: No  . Sexual activity: Not Currently   Other Topics Concern  . None   Social History Narrative  . None    Additional social history is notable in that she is a former [employer] architect.  She is widowed for 19 years.  She has 2 children, 4 grandchildren, and 11 great grandchildren.  She lives by herself.  She has 2 years of college.  She retired in 1970.  She previously had smoked for 55 years.  She does walk approximately 3-4 days per week.  Family History:  The patient's family history includes Heart disease in her sister; Hyperlipidemia in her father and mother; Hypertension in her father and mother.   ROS General: Negative; No fevers, chills, or night sweats;  HEENT: Negative; No changes in vision or hearing, sinus congestion, difficulty swallowing Pulmonary: Negative; No cough, wheezing, shortness of breath,  hemoptysis Cardiovascular: see HPI GI: Negative; No nausea, vomiting, diarrhea, or abdominal pain GU: Negative; No dysuria, hematuria, or difficulty voiding Musculoskeletal: Chronic neck and arm pain since her remote trauma. Hematologic/Oncology: Negative; no easy bruising, bleeding Endocrine: Negative; no heat/cold intolerance; no diabetes Neuro: Negative; no changes in balance, headaches Skin: Negative; No rashes or skin lesions Psychiatric: Negative; No behavioral problems, depression Sleep: Negative; No snoring, daytime sleepiness, hypersomnolence, bruxism, restless legs, hypnogognic hallucinations, no cataplexy Other comprehensive 14 point system review is negative.   PHYSICAL EXAM:   VS:  BP 134/82   Pulse 79   Ht 5\' 2"  (1.575 m)   Wt 162  lb (73.5 kg)   LMP 06/20/1986   BMI 29.63 kg/m     Repeat blood pressure by me was 132/80 Wt Readings from Last 3 Encounters:  11/22/16 162 lb (73.5 kg)  10/26/16 162 lb 6.4 oz (73.7 kg)  10/19/16 163 lb 6.4 oz (74.1 kg)    General: Alert, oriented, no distress.  Skin: normal turgor, no rashes, warm and dry HEENT: Normocephalic, atraumatic. Pupils equal round and reactive to light; sclera anicteric; extraocular muscles intact; Fundi on previous exam showed mild arteriolar narrowing without hemorrhages or exudates. Nose without nasal septal hypertrophy Mouth/Parynx benign; Mallinpatti scale 1 Neck: No JVD, no carotid bruits; normal carotid upstroke Lungs: clear to ausculatation and percussion; no wheezing or rales Chest wall: without tenderness to palpitation Heart: PMI not displaced, RRR, s1 s2 normal, 1/6 systolic murmur, no diastolic murmur, no rubs, gallops, thrills, or heaves Abdomen: soft, nontender; no hepatosplenomehaly, BS+; abdominal aorta nontender and not dilated by palpation. Back: no CVA tenderness Pulses 2+ Musculoskeletal: full range of motion, normal strength, no joint deformities Extremities: no clubbing cyanosis or  edema, Homan's sign negative  Neurologic: grossly nonfocal; Cranial nerves grossly wnl Psychologic: Normal mood and affect    Studies/Labs Reviewed:   EKG:  EKG is ordered today. ECG (independently read by me): Normal sinus rhythm at 79 bpm.  No ectopy.  Normal intervals.  No significant ST changes.  10/26/2016 ECG (independently read by me): Normal sinus rhythm at 65 bpm.  Normal intervals.  No ectopy.  No significant ST-T changes.  Recent Labs: BMP Latest Ref Rng & Units 10/19/2016 02/03/2016 01/01/2014  Glucose 65 - 99 mg/dL 98 91 89  BUN 8 - 27 mg/dL '16 12 10  '$ Creatinine 0.57 - 1.00 mg/dL 0.64 0.84 0.77  BUN/Creat Ratio 12 - '28 25 14 '$ -  Sodium 134 - 144 mmol/L 140 142 138  Potassium 3.5 - 5.2 mmol/L 3.7 4.0 3.6  Chloride 96 - 106 mmol/L 100 100 103  CO2 18 - 29 mmol/L '24 25 28  '$ Calcium 8.7 - 10.3 mg/dL 9.6 9.8 9.3     Hepatic Function Latest Ref Rng & Units 01/02/2014 07/28/2010 01/14/2009  Total Protein 6.0 - 8.3 g/dL 6.6 6.8 7.6  Albumin 3.5 - 5.2 g/dL 3.8 4.0 4.3  AST 0 - 37 U/L '27 18 28  '$ ALT 0 - 35 U/L '16 9 18  '$ Alk Phosphatase 39 - 117 U/L 49 55 63  Total Bilirubin 0.2 - 1.2 mg/dL 0.4 0.4 0.5  Bilirubin, Direct 0.0 - 0.3 mg/dL 0.1 - -    CBC Latest Ref Rng & Units 10/26/2016 02/03/2016 01/02/2014  WBC 3.8 - 10.8 K/uL 7.9 7.5 5.7  Hemoglobin 11.7 - 15.5 g/dL 12.7 12.2 11.4(L)  Hematocrit 35.0 - 45.0 % 40.8 38.7 35.7(L)  Platelets 140 - 400 K/uL 292 275 295   Lab Results  Component Value Date   MCV 80.2 10/26/2016   MCV 75 (L) 02/03/2016   MCV 71.3 (L) 01/02/2014   Lab Results  Component Value Date   TSH 1.49 10/26/2016   No results found for: HGBA1C   BNP No results found for: BNP  ProBNP No results found for: PROBNP   Lipid Panel     Component Value Date/Time   CHOL 154 10/19/2016 1401   TRIG 256 (H) 10/19/2016 1401   HDL 43 10/19/2016 1401   CHOLHDL 3.6 10/19/2016 1401   CHOLHDL 3.8 01/01/2014 1433   VLDL 38 01/01/2014 1433   LDLCALC 60 10/19/2016  1401  RADIOLOGY: Mm Digital Screening Bilateral  Result Date: 11/24/2016 CLINICAL DATA:  Screening. EXAM: DIGITAL SCREENING BILATERAL MAMMOGRAM WITH CAD COMPARISON:  Previous exam(s). ACR Breast Density Category c: The breast tissue is heterogeneously dense, which may obscure small masses. FINDINGS: There are no findings suspicious for malignancy. Images were processed with CAD. IMPRESSION: No mammographic evidence of malignancy. A result letter of this screening mammogram will be mailed directly to the patient. RECOMMENDATION: Screening mammogram in one year. (Code:SM-B-01Y) BI-RADS CATEGORY  1: Negative. Electronically Signed   By: Claudie Revering M.D.   On: 11/24/2016 10:07     Additional studies/ records that were reviewed today include:  I reviewed the patient's prior cardiac catheterization from 11/03/2010.  I reviewed recent office records.   I reviewed her nuclear perfusion study, as well as her echo Doppler study and recent laboratory.    ASSESSMENT:    1. Essential hypertension   2. Coronary artery disease involving native coronary artery of native heart with angina pectoris ([place])   3. Mixed hyperlipidemia   4. History of tobacco abuse      PLAN:  [name] is a 71 year old female has a prior 70 year history of tobacco use, a greater than 30 year history of hypertension, as well as a history of hyperlipidemia and mild nonobstructive coronary artery disease noted a cardiac catheterization in May 2012.  She was felt to have chronic stable angina.  Recently, she had experienced recurrent episodes of nonexertional chest pressure which was like a band squeezing on her chest.  She also had noticed intermittent palpitations but admits to pressure today is upper normal.  When I initially saw her, I further titrated carvedilol and she now takes 18.75 mg twice a day.  Her palpitations and episodes of heart rate irregularity have significantly improved.  She also has significantly reduced  her caffeine intake.  She continues also to be on HCTZ.  Her blood pressure today is controlled on this combination therapy.  I reviewed her echo Doppler study, which shows hyperdynamic LV function and mitral annular calcification.  Her nuclear perfusion study shows normal perfusion and again reveals hyperdynamic LV function.  I reviewed recent laboratory.  Her most recent lipid panel shows an excellent LDL cholesterol at 60 but she has significant triglyceride elevation.  We discussed improve diet, particularly with reference to his swimming and sugar intake as well as reduction in carbohydrates.  I have recommended initiation of omega-3 fatty acids 2 capsules daily.  I reassured her that her chest pain was most likely not ischemic in etiology.  Her GERD is controlled with omeprazole.  I will see her one year for reevaluation or sooner if problems arise.   Medication Adjustments/Labs and Tests Ordered: Current medicines are reviewed at length with the patient today.  Concerns regarding medicines are outlined above.  Medication changes, Labs and Tests ordered today are listed in the Patient Instructions below. Patient Instructions  Your physician has recommended you make the following change in your medication: Dr Claiborne Billings recommends that you get some over the counter fish oil. Take 2 capsules daily of the '1000mg'$ .  Your physician wants you to follow-up in: 6 months or sooner if needed. You will receive a reminder letter in the mail two months in advance. If you don't receive a letter, please call our office to schedule the follow-up appointment.  If you need a refill on your cardiac medications before your next appointment, please call your pharmacy.      Signed, Marcello Moores  Claiborne Billings, MD  11/24/2016 9:48 PM    [facility] [address] [place], [place], [place]  [address] Phone: [phone]

## 2016-11-22 NOTE — Patient Instructions (Signed)
Your physician has recommended you make the following change in your medication: Dr Claiborne Billings recommends that you get some over the counter fish oil. Take 2 capsules daily of the 1000mg .  Your physician wants you to follow-up in: 6 months or sooner if needed. You will receive a reminder letter in the mail two months in advance. If you don't receive a letter, please call our office to schedule the follow-up appointment.  If you need a refill on your cardiac medications before your next appointment, please call your pharmacy.

## 2016-11-23 ENCOUNTER — Encounter: Payer: Self-pay | Admitting: *Deleted

## 2017-01-25 ENCOUNTER — Encounter: Payer: Medicare Other | Admitting: Internal Medicine

## 2017-02-22 ENCOUNTER — Ambulatory Visit (INDEPENDENT_AMBULATORY_CARE_PROVIDER_SITE_OTHER): Payer: Medicare Other | Admitting: Internal Medicine

## 2017-02-22 ENCOUNTER — Encounter: Payer: Self-pay | Admitting: Internal Medicine

## 2017-02-22 DIAGNOSIS — I2510 Atherosclerotic heart disease of native coronary artery without angina pectoris: Secondary | ICD-10-CM

## 2017-02-22 DIAGNOSIS — Z23 Encounter for immunization: Secondary | ICD-10-CM

## 2017-02-22 DIAGNOSIS — I10 Essential (primary) hypertension: Secondary | ICD-10-CM

## 2017-02-22 DIAGNOSIS — K279 Peptic ulcer, site unspecified, unspecified as acute or chronic, without hemorrhage or perforation: Secondary | ICD-10-CM

## 2017-02-22 DIAGNOSIS — I252 Old myocardial infarction: Secondary | ICD-10-CM | POA: Diagnosis not present

## 2017-02-22 DIAGNOSIS — E782 Mixed hyperlipidemia: Secondary | ICD-10-CM | POA: Diagnosis not present

## 2017-02-22 DIAGNOSIS — M25512 Pain in left shoulder: Secondary | ICD-10-CM

## 2017-02-22 HISTORY — DX: Pain in left shoulder: M25.512

## 2017-02-22 MED ORDER — DICLOFENAC SODIUM 1 % TD GEL: 2.0000 g | Freq: Three times a day (TID) | TRANSDERMAL | 1 refills | Status: DC | PRN

## 2017-02-22 NOTE — Assessment & Plan Note (Addendum)
Patient states that she has had this pain for 1 month now. Patient evaluated and at this time the pain appears to most likely be tendinitis. Patient has free range of motion, negative for supraspinatus evaluation, denies loss of strength, or pain with extension and flexion adduction or abduction on physical exam. Patient attests only to pain with abduction greater than 190. Patient prescribed voltaren gel to apply to the area as she stated to have a history of PUD that was diagnoses in [place]. In addition patient states that this is an ongoing issue and as such she attempts to avoid NSAIDS. Patient advised to use the gel for two weeks. If no improvement at that time, she is to contact our clinic for further recommendation should she desire.

## 2017-02-22 NOTE — Patient Instructions (Signed)
You have been given a prescription for Voltaren gel to apply to your left shoulder as we discussed during your appointment. Please continue to apply this to the area up to 3 times daily as needed for pain. If this fails to resolve your pain and if the pain becomes worse or unbearable, please contact the clinic for additional assistance.  Please continue to make a concerted effort to take your medications as scheduled on a regular basis.   Thank you for your visit to our clinic today. It was nice to meet you and if you have any questions just let us know.

## 2017-02-22 NOTE — Assessment & Plan Note (Addendum)
Patient currently minimally compliant with with her medication regimen at this time. Patient's initial pressure of 149/76. Pressure checked again and noted to be 135/91. Continued to discuss medication compliance with the patient to which she agreed to attempt better compliance in the future. Recommended a pharmacy consultation for medication compliance assistance but the patient deferred at this time opting instead to wait until the next visit.

## 2017-02-22 NOTE — Progress Notes (Signed)
CC: "left shoulder pain"  HPI:   [name] is a 71 y.o. female who presented today for a routine visit and evaluation of her left shoulder pain. Patient stated that she was placed on fish oil tablets by her cardiologist who was also upset that her PCP was not more closely monitoring her triglycerides and cholesterol. In addition, she noted that he did not think her chest pain was cardiac in nature indicating that she should continue her current medication regimen.   With regard to the should pain, the patient stated that the pain began approximately one month prior, is sharp in nature and only seriously noticeable with contact to that area. It is acutely tender to touch even with only a pillow. Patient denied relief with NSAIDS, tylenol, heat, cold compress, massage, her other means. Patient stated that the pain was not worse with motion, until the arm passes level with the floor. Patient did state that she has not tried any consistent treatment only intermittently using pain medication for fear it would cause a flare of her peptic ulcer disease. There was no history of PUD in the patient record.   Patient stated that she regularly forgets to take her medications at least 2-3 times weekly. She stated that she is often simply too busy to remember to take them.  Past Medical History:  Diagnosis Date  . APPENDECTOMY, HX OF 07/10/2006   Qualifier: Diagnosis of  By: Oretha Ellis    . CAD (coronary artery disease) 2007   nonobstructive, 30% LAD 02/2006  . COPD (chronic obstructive pulmonary disease) ([place])   . 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 GERD (gastroesophageal reflux disease)   . Hemorrhoids   . HLD (hyperlipidemia)   . Hypertension   . HYSTERECTOMY, HX OF 07/10/2006   Qualifier: Diagnosis of  By: Oretha Ellis    . Myocardial infarct, old     Vitals:   02/22/17 1327 02/22/17 1413  BP: (!) 149/76 (!) 135/91  Pulse: 72 71  Temp: 97.8  F (36.6 C)   TempSrc: Oral   SpO2: 98%   Weight: 166 lb 3.2 oz (75.4 kg)   Height: 5\' 2"  (1.575 m)    Review of Systems:   Review of Systems  Constitutional: Negative for chills, diaphoresis and fever.  HENT: Negative for ear pain and sinus pain.   Eyes: Negative for blurred vision, photophobia and redness.  Respiratory: Negative for cough and shortness of breath.   Cardiovascular: Negative for chest pain and leg swelling.  Gastrointestinal: Negative for constipation, diarrhea, nausea and vomiting.  Genitourinary: Negative for flank pain and urgency.  Musculoskeletal:       Pain near the left shoulder posterior and distal to the A/C joint that is tender to touch but not as tender with motion.   Neurological: Negative for dizziness and headaches.  Psychiatric/Behavioral: Negative for depression. The patient is not nervous/anxious.    Physical Exam:  Physical Exam  Constitutional: She is oriented to person, place, and time. She appears well-developed and well-nourished. No distress.  HENT:  Head: Normocephalic and atraumatic.  Eyes: Conjunctivae and EOM are normal.  Neck: Normal range of motion.  Cardiovascular: Normal rate and regular rhythm.   No murmur heard. Pulmonary/Chest: Effort normal and breath sounds normal. No respiratory distress.  Abdominal: Soft. Bowel sounds are normal. She exhibits no distension. There is no tenderness.  Musculoskeletal: She exhibits tenderness (Tenderness to the left should on palpation  near the insertion of the deltoid and trapezius ). She exhibits no edema.  Decreased range of motion  Neurological: She is alert and oriented to person, place, and time.  Skin: Skin is warm. She is not diaphoretic.  Psychiatric: She has a normal mood and affect. Her behavior is normal.    Assessment & Plan:   See Encounters Tab for problem based charting.  Patient seen with Dr. Dareen Piano

## 2017-02-22 NOTE — Assessment & Plan Note (Addendum)
Patient started him on fish oil tablets 1000 mg twice daily by her cardiologist. He recommended that she take this daily. She was advised that her most recent lipid panel indicated elevated triglycerides and that she should continue to monitor her intake of fatty foods as well as to continue taking her statin and fish oil. Patient agreed to continue this plan, but answered honestly that she was not sure there would be a significant difference in her diet.

## 2017-02-24 ENCOUNTER — Encounter: Payer: Self-pay | Admitting: Internal Medicine

## 2017-02-27 NOTE — Progress Notes (Signed)
Internal Medicine Clinic Attending  I saw and evaluated the patient.  I personally confirmed the key portions of the history and exam documented by Dr. Harbrecht and I reviewed pertinent patient test results.  The assessment, diagnosis, and plan were formulated together and I agree with the documentation in the resident's note.  

## 2017-03-30 ENCOUNTER — Other Ambulatory Visit: Payer: Self-pay | Admitting: Cardiovascular Disease

## 2017-05-17 ENCOUNTER — Encounter: Payer: Self-pay | Admitting: Cardiovascular Disease

## 2017-05-17 ENCOUNTER — Ambulatory Visit (INDEPENDENT_AMBULATORY_CARE_PROVIDER_SITE_OTHER): Payer: Medicare Other | Admitting: Cardiovascular Disease

## 2017-05-17 VITALS — BP 154/94 | HR 74 | Ht 62.0 in | Wt 170.4 lb

## 2017-05-17 DIAGNOSIS — I10 Essential (primary) hypertension: Secondary | ICD-10-CM

## 2017-05-17 DIAGNOSIS — I209 Angina pectoris, unspecified: Secondary | ICD-10-CM | POA: Diagnosis not present

## 2017-05-17 DIAGNOSIS — R002 Palpitations: Secondary | ICD-10-CM

## 2017-05-17 DIAGNOSIS — Z87891 Personal history of nicotine dependence: Secondary | ICD-10-CM

## 2017-05-17 DIAGNOSIS — K219 Gastro-esophageal reflux disease without esophagitis: Secondary | ICD-10-CM

## 2017-05-17 DIAGNOSIS — E669 Obesity, unspecified: Secondary | ICD-10-CM | POA: Diagnosis not present

## 2017-05-17 DIAGNOSIS — I2510 Atherosclerotic heart disease of native coronary artery without angina pectoris: Secondary | ICD-10-CM | POA: Diagnosis not present

## 2017-05-17 DIAGNOSIS — I25119 Atherosclerotic heart disease of native coronary artery with unspecified angina pectoris: Secondary | ICD-10-CM | POA: Diagnosis not present

## 2017-05-17 DIAGNOSIS — E782 Mixed hyperlipidemia: Secondary | ICD-10-CM | POA: Diagnosis not present

## 2017-05-17 MED ORDER — CARVEDILOL 25 MG PO TABS: 25.0000 mg | ORAL_TABLET | Freq: Two times a day (BID) | ORAL | 3 refills | Status: DC

## 2017-05-17 NOTE — Patient Instructions (Signed)
Medication Instructions:  INCREASE carvedilol (Coreg) to 25 mg two times daily  Follow-Up: Your physician wants you to follow-up in: 6 months with Dr. Claiborne Billings. You will receive a reminder letter in the mail two months in advance. If you don't receive a letter, please call our office to schedule the follow-up appointment.   Any Other Special Instructions Will Be Listed Below (If Applicable).     If you need a refill on your cardiac medications before your next appointment, please call your pharmacy.

## 2017-05-17 NOTE — Progress Notes (Signed)
Cardiology Office Note    Date:  05/17/2017   ID:  Alexis Kline, DOB 1946/01/19, MRN [id number]  PCP:  Kathi Ludwig, MD  Cardiologist:  Shelva Majestic, MD   No chief complaint on file.   History of Present Illness:  Alexis Kline is a 71 y.o. female who wa referred through the courtesy of Dr. Martyn Malay for evaluation of chest pain.  I saw her for initial evaluation on 10/26/2016 with initial follow-up 1 month later. She presents for 5 month follow-up evaluation.  Alexis Kline  has a greater than 30 year history of hypertension, a history of hyperlipidemia, as well as GERD.  She also has a history of osteopenia.  Recently, she developed a bandlike chest pressure around her upper chest without radiation that occurred at rest and lasted for 15-20 minutes with spontaneous resolution.  Her symptoms were also associated with shortness of breath and she felt that she could not catch her breath.  She denies any classic exertional symptomatology.  Reportedly also has a history of chronic stable angina, and remotely had undergone cardiac catheterization.  In 2012 .  She was found to have 30% areas of segmental disease in her LAD.  Smoking cessation was advised.  She was treated medically.  She has a history of bilateral neck pain radiating to her shoulders and has chronic cervical of musculoskeletal discomfort from traumatic injury over 20 years ago.  She was recently evaluated by Dr. Quay Burow and with her recent chest pain symptomatology she is now referred for cardiology evaluation.  Of note, the patient states she drinks at least 8 cups of coffee per day.  She admits to occasional palpitations.  When I initially saw her, we discussed significant reduction in caffeine intake.  Her blood pressure was upper normal in width.  Her chest pain.  I recommended titration of carvedilol to 18.75 mg twice a day.  She underwent an echo Doppler study on 11/09/2016 which showed hyperdynamic LV  function with an EF of 65-70%.  There was grade 1 diastolic dysfunction.  There was mild mitral annular calcification.  A nuclear perfusion study showed an EF of 81%.  There were no ECG changes during stress.  She had normal perfusion.  Laboratory showed a hemoglobin of 12.7 and hematocrit of 40.8.  TSH was normal at 1.49.   Since I last saw her in June 2018, she has noticed at times that her pulse will increase.  She admits to being under significant increased stress with her daughter who is 28 years old, was recently diagnosis having stage IV lung cancer.  She has started radiation therapy and will initiate chemotherapy.  This has resulted in the patient being very tearful.  She denies recurrent chest tightness.  She denies presyncope or syncope.  She presents for reevaluation.   Past Medical History:  Diagnosis Date  . APPENDECTOMY, HX OF 07/10/2006   Qualifier: Diagnosis of  By: Oretha Ellis    . CAD (coronary artery disease) 2007   nonobstructive, 30% LAD 02/2006  . COPD (chronic obstructive pulmonary disease) ([place])   . 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 GERD (gastroesophageal reflux disease)   . Hemorrhoids   . HLD (hyperlipidemia)   . Hypertension   . HYSTERECTOMY, HX OF 07/10/2006   Qualifier: Diagnosis of  By: Oretha Ellis    . Myocardial infarct, old     Past Surgical History:  Procedure Laterality Date  .  ABDOMINAL HYSTERECTOMY  1988  . APPENDECTOMY  1966  . BLADDER SUSPENSION  1997  . BREAST EXCISIONAL BIOPSY Right 1988  . BREAST LUMPECTOMY     right breat  . [place]   3 operations in right hand and 2 operations in left hand. Dr. Redmond Pulling in [place]  . PTCA  2007  . rectal stapling  2008   Done by Dr. Marlou Starks  . RECTAL SURGERY  01/07/2007   Dr. Marlou Starks in [place].   Marland Kitchen SHOULDER SURGERY  1991   Right shoulder surgery in 1991 and left shoulder surgery in 1992 by Dr. Redmond Pulling in [place].    Current  Medications: Outpatient Medications Prior to Visit  Medication Sig Dispense Refill  . aspirin 81 MG EC tablet Take 1 tablet (81 mg total) by mouth daily. 90 tablet 3  . atorvastatin (LIPITOR) 40 MG tablet TAKE 1 TABLET ONCE DAILY. 90 tablet 3  . B Complex-Biotin-FA (SUPER B-COMPLEX PO) Take 1 tablet by mouth daily.      . diclofenac sodium (VOLTAREN) 1 % GEL Apply 2 g topically 3 (three) times daily as needed. Apply to the pain in your left shoulder as we discussed. 1 Tube 1  . hydrochlorothiazide (HYDRODIURIL) 25 MG tablet TAKE 1 TABLET ONCE DAILY. 90 tablet 3  . nitroGLYCERIN (NITROSTAT) 0.4 MG SL tablet Place 1 tablet (0.4 mg total) under the tongue every 5 (five) minutes. up to 3 times as needed for chest pain 30 tablet 0  . omeprazole (PRILOSEC) 40 MG capsule TAKE (1) CAPSULE DAILY. 90 capsule 3  . senna-docusate (SENNALAX-S) 8.6-50 MG tablet Take 2 tablets by mouth daily as needed (for constipation). 60 tablet 5  . carvedilol (COREG) 12.5 MG tablet TAKE 1&1/2 TABLETS TWICE DAILY. 270 tablet 3  . pravastatin (PRAVACHOL) 40 MG tablet Take 40 mg by mouth daily.    Marland Kitchen HYDROcodone-acetaminophen (NORCO/VICODIN) 5-325 MG tablet Take 1 tablet by mouth daily as needed for moderate pain. (Patient not taking: Reported on 05/17/2017) 45 tablet 0   No facility-administered medications prior to visit.      Allergies:   [name] isothiocyanate]; Iodine; Other; and Tape   Social History   Socioeconomic History  . Marital status: Widowed    Spouse name: None  . Number of children: None  . Years of education: None  . Highest education level: None  Social Needs  . Financial resource strain: None  . Food insecurity - worry: None  . Food insecurity - inability: None  . Transportation needs - medical: None  . Transportation needs - non-medical: None  Occupational History  . None  Tobacco Use  . Smoking status: Former Smoker    Years: 54.00    Types: Cigarettes    Last attempt to quit:  06/20/2013    Years since quitting: 3.9  . Smokeless tobacco: Never Used  Substance and Sexual Activity  . Alcohol use: Yes    Alcohol/week: 0.0 oz    Comment: rarely "maybe a drink of New Year's"  . Drug use: No  . Sexual activity: Not Currently  Other Topics Concern  . None  Social History Narrative  . 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  She is widowed for 19 years.  She has 2 children, 4 grandchildren, and 11 great grandchildren.  She lives by herself.  She has 2 years of college.  She retired in 1970.  She previously  had smoked for 55 years.  She does walk approximately 3-4 days per week.  Family History:  The patient's family history includes Heart disease in her sister; Hyperlipidemia in her father and mother; Hypertension in her father and mother.   ROS General: Negative; No fevers, chills, or night sweats;  HEENT: Negative; No changes in vision or hearing, sinus congestion, difficulty swallowing Pulmonary: Negative; No cough, wheezing, shortness of breath, hemoptysis Cardiovascular: see HPI GI: Negative; No nausea, vomiting, diarrhea, or abdominal pain GU: Negative; No dysuria, hematuria, or difficulty voiding Musculoskeletal: Chronic neck and arm pain since her remote trauma. Hematologic/Oncology: Negative; no easy bruising, bleeding Endocrine: Negative; no heat/cold intolerance; no diabetes Neuro: Negative; no changes in balance, headaches Skin: Negative; No rashes or skin lesions Psychiatric: Negative; No behavioral problems, depression Sleep: Negative; No snoring, daytime sleepiness, hypersomnolence, bruxism, restless legs, hypnogognic hallucinations, no cataplexy Other comprehensive 14 point system review is negative.   PHYSICAL EXAM:   VS:  BP (!) 154/94   Pulse 74   Ht '5\' 2"'$  (1.575 m)   Wt 170 lb 6.4 oz (77.3 kg)   LMP 06/20/1986   SpO2 97%   BMI 31.17 kg/m     Repeat blood pressure by me was improved at  130/78  Wt Readings from Last 3 Encounters:  05/17/17 170 lb 6.4 oz (77.3 kg)  02/22/17 166 lb 3.2 oz (75.4 kg)  11/22/16 162 lb (73.5 kg)    General: Alert, oriented, no distress.  Skin: normal turgor, no rashes, warm and dry HEENT: Normocephalic, atraumatic. Pupils equal round and reactive to light; sclera anicteric; extraocular muscles intact;  Nose without nasal septal hypertrophy Mouth/Parynx benign; Mallinpatti scale 1 Neck: No JVD, no carotid bruits; normal carotid upstroke Lungs: clear to ausculatation and percussion; no wheezing or rales Chest wall: without tenderness to palpitation Heart: PMI not displaced, RRR, s1 s2 normal, 1/6 systolic murmur, no diastolic murmur, no rubs, gallops, thrills, or heaves Abdomen: soft, nontender; no hepatosplenomehaly, BS+; abdominal aorta nontender and not dilated by palpation. Back: no CVA tenderness Pulses 2+ Musculoskeletal: full range of motion, normal strength, no joint deformities Extremities: no clubbing cyanosis or edema, Homan's sign negative  Neurologic: grossly nonfocal; Cranial nerves grossly wnl Psychologic: Normal mood and affect   Studies/Labs Reviewed:   EKG:  EKG is ordered today. ECG (independently read by me): Normal sinus rhythm at 74 bpm.  QS complex.  Anteroseptally  11/22/2016 ECG (independently read by me): Normal sinus rhythm at 79 bpm.  No ectopy.  Normal intervals.  No significant ST changes.  10/26/2016 ECG (independently read by me): Normal sinus rhythm at 65 bpm.  Normal intervals.  No ectopy.  No significant ST-T changes.  Recent Labs: BMP Latest Ref Rng & Units 10/19/2016 02/03/2016 01/01/2014  Glucose 65 - 99 mg/dL 98 91 89  BUN 8 - 27 mg/dL '16 12 10  '$ Creatinine 0.57 - 1.00 mg/dL 0.64 0.84 0.77  BUN/Creat Ratio 12 - '28 25 14 '$ -  Sodium 134 - 144 mmol/L 140 142 138  Potassium 3.5 - 5.2 mmol/L 3.7 4.0 3.6  Chloride 96 - 106 mmol/L 100 100 103  CO2 18 - 29 mmol/L '24 25 28  '$ Calcium 8.7 - 10.3 mg/dL 9.6 9.8  9.3     Hepatic Function Latest Ref Rng & Units 01/02/2014 07/28/2010 01/14/2009  Total Protein 6.0 - 8.3 g/dL 6.6 6.8 7.6  Albumin 3.5 - 5.2 g/dL 3.8 4.0 4.3  AST 0 - 37 U/L '27 18 28  '$ ALT 0 -  35 U/L '16 9 18  '$ Alk Phosphatase 39 - 117 U/L 49 55 63  Total Bilirubin 0.2 - 1.2 mg/dL 0.4 0.4 0.5  Bilirubin, Direct 0.0 - 0.3 mg/dL 0.1 - -    CBC Latest Ref Rng & Units 10/26/2016 02/03/2016 01/02/2014  WBC 3.8 - 10.8 K/uL 7.9 7.5 5.7  Hemoglobin 11.7 - 15.5 g/dL 12.7 12.2 11.4(L)  Hematocrit 35.0 - 45.0 % 40.8 38.7 35.7(L)  Platelets 140 - 400 K/uL 292 275 295   Lab Results  Component Value Date   MCV 80.2 10/26/2016   MCV 75 (L) 02/03/2016   MCV 71.3 (L) 01/02/2014   Lab Results  Component Value Date   TSH 1.49 10/26/2016   No results found for: HGBA1C   BNP No results found for: BNP  ProBNP No results found for: PROBNP   Lipid Panel     Component Value Date/Time   CHOL 154 10/19/2016 1401   TRIG 256 (H) 10/19/2016 1401   HDL 43 10/19/2016 1401   CHOLHDL 3.6 10/19/2016 1401   CHOLHDL 3.8 01/01/2014 1433   VLDL 38 01/01/2014 1433   LDLCALC 60 10/19/2016 1401     RADIOLOGY: No results found.   Additional studies/ records that were reviewed today include:  I reviewed the patient's prior cardiac catheterization from 11/03/2010.  I reviewed recent office records.   I reviewed her nuclear perfusion study, as well as her echo Doppler study and recent laboratory.  ASSESSMENT:    1. Essential hypertension   2. Coronary artery disease involving native coronary artery of native heart with angina pectoris ([place])   3. History of tobacco abuse   4. Mixed hyperlipidemia   5. Palpitations   6. Gastroesophageal reflux disease without esophagitis   7. Mild obesity      PLAN:  Alexis Kline is a 71 year old female has a history of significant tobacco abuse, having smoked for 55 years, as well as a greater than 30 year history of hypertension.  She was found to have mild  nonobstructive CAD.  At cardiac catheterization in May 2012.  She has a history of hyperlipidemia.  She has noticed palpitations in the past which have improved with increasing carvedilol dosing.  She has had some blood pressure lability.  Most recently, she has been on carvedilol 18.75 mg twice a day in addition to HCTZ 25 mg.  Her blood pressure initially today was elevated and on repeat was improved.  She has experienced some episodes particular when she is stressed that her heart rate is increasing.  I am recommending further titration of carvedilol to 25 mg twice a day, which will be helpful both for blood pressure lability as well as heart rate.  Her oxygen saturation  is excellent at 97%.  She continues to be on atorvastatin 40 mg, which was started in place of her prior pravastatin dose.  She has GERD which is controlled with omeprazole.  She is not having any episodes of chest tightness.  She was found to have normal perfusion on recent nuclear imaging.  She has documented hyperdynamic LV function with grade 1 diastolic dysfunction.  As long as she remains stable, I'll see her in 6 months for reevaluation.   Medication Adjustments/Labs and Tests Ordered: Current medicines are reviewed at length with the patient today.  Concerns regarding medicines are outlined above.  Medication changes, Labs and Tests ordered today are listed in the Patient Instructions below. Patient Instructions  Medication Instructions:  INCREASE carvedilol (Coreg) to 25 mg  two times daily  Follow-Up: Your physician wants you to follow-up in: 6 months with Dr. Claiborne Billings. You will receive a reminder letter in the mail two months in advance. If you don't receive a letter, please call our office to schedule the follow-up appointment.   Any Other Special Instructions Will Be Listed Below (If Applicable).     If you need a refill on your cardiac medications before your next appointment, please call your pharmacy.        Signed, Shelva Majestic, MD  05/17/2017 2:37 PM    [facility] [address] [place], [place], [place]  [address] Phone: [phone]

## 2017-05-25 ENCOUNTER — Ambulatory Visit: Payer: Medicare Other | Admitting: Cardiovascular Disease

## 2017-05-31 ENCOUNTER — Encounter: Payer: Medicare Other | Admitting: Internal Medicine

## 2017-06-26 ENCOUNTER — Encounter: Payer: Self-pay | Admitting: *Deleted

## 2017-07-05 ENCOUNTER — Other Ambulatory Visit: Payer: Self-pay | Admitting: *Deleted

## 2017-07-06 MED ORDER — OMEPRAZOLE 40 MG PO CPDR: DELAYED_RELEASE_CAPSULE | ORAL | 3 refills | Status: DC

## 2017-08-02 ENCOUNTER — Encounter: Payer: Medicare Other | Admitting: Internal Medicine

## 2017-09-25 ENCOUNTER — Other Ambulatory Visit: Payer: Self-pay

## 2017-09-25 ENCOUNTER — Other Ambulatory Visit: Payer: Self-pay | Admitting: Internal Medicine

## 2017-09-25 DIAGNOSIS — Z1231 Encounter for screening mammogram for malignant neoplasm of breast: Secondary | ICD-10-CM

## 2017-09-25 MED ORDER — HYDROCHLOROTHIAZIDE 25 MG PO TABS: 25.0000 mg | ORAL_TABLET | Freq: Every day | ORAL | 0 refills | Status: DC

## 2017-09-26 ENCOUNTER — Other Ambulatory Visit: Payer: Self-pay | Admitting: Cardiovascular Disease

## 2017-09-26 DIAGNOSIS — I2510 Atherosclerotic heart disease of native coronary artery without angina pectoris: Secondary | ICD-10-CM

## 2017-09-26 NOTE — Telephone Encounter (Signed)
REFILL 

## 2017-09-27 ENCOUNTER — Encounter: Payer: Medicare Other | Admitting: Internal Medicine

## 2017-09-27 ENCOUNTER — Other Ambulatory Visit: Payer: Self-pay | Admitting: *Deleted

## 2017-09-27 DIAGNOSIS — I10 Essential (primary) hypertension: Secondary | ICD-10-CM

## 2017-09-28 MED ORDER — HYDROCHLOROTHIAZIDE 25 MG PO TABS: 25.0000 mg | ORAL_TABLET | Freq: Every day | ORAL | 0 refills | Status: DC

## 2017-10-26 ENCOUNTER — Ambulatory Visit: Payer: Medicare Other | Admitting: Cardiovascular Disease

## 2017-11-09 ENCOUNTER — Ambulatory Visit: Payer: Medicare Other

## 2017-11-22 ENCOUNTER — Encounter: Payer: Medicare Other | Admitting: Internal Medicine

## 2017-11-27 ENCOUNTER — Ambulatory Visit: Payer: Medicare Other | Admitting: Cardiovascular Disease

## 2017-11-28 ENCOUNTER — Ambulatory Visit: Payer: Medicare Other

## 2017-12-19 ENCOUNTER — Encounter: Payer: Medicare Other | Admitting: Internal Medicine

## 2017-12-27 ENCOUNTER — Other Ambulatory Visit: Payer: Self-pay | Admitting: Cardiovascular Disease

## 2017-12-27 DIAGNOSIS — I2510 Atherosclerotic heart disease of native coronary artery without angina pectoris: Secondary | ICD-10-CM

## 2017-12-27 DIAGNOSIS — I10 Essential (primary) hypertension: Secondary | ICD-10-CM

## 2018-01-02 ENCOUNTER — Ambulatory Visit: Payer: Medicare Other

## 2018-02-13 ENCOUNTER — Encounter: Payer: Self-pay | Admitting: Internal Medicine

## 2018-02-13 ENCOUNTER — Other Ambulatory Visit: Payer: Self-pay

## 2018-02-13 ENCOUNTER — Ambulatory Visit (INDEPENDENT_AMBULATORY_CARE_PROVIDER_SITE_OTHER): Payer: Medicare Other | Admitting: Internal Medicine

## 2018-02-13 VITALS — BP 148/92 | HR 65 | Temp 98.3°F | Ht 62.0 in | Wt 159.7 lb

## 2018-02-13 DIAGNOSIS — I10 Essential (primary) hypertension: Secondary | ICD-10-CM | POA: Diagnosis not present

## 2018-02-13 DIAGNOSIS — M5030 Other cervical disc degeneration, unspecified cervical region: Secondary | ICD-10-CM | POA: Diagnosis not present

## 2018-02-13 DIAGNOSIS — I2510 Atherosclerotic heart disease of native coronary artery without angina pectoris: Secondary | ICD-10-CM

## 2018-02-13 DIAGNOSIS — E785 Hyperlipidemia, unspecified: Secondary | ICD-10-CM

## 2018-02-13 DIAGNOSIS — Z79899 Other long term (current) drug therapy: Secondary | ICD-10-CM

## 2018-02-13 DIAGNOSIS — W19XXXA Unspecified fall, initial encounter: Secondary | ICD-10-CM

## 2018-02-13 DIAGNOSIS — K219 Gastro-esophageal reflux disease without esophagitis: Secondary | ICD-10-CM

## 2018-02-13 DIAGNOSIS — T148XXA Other injury of unspecified body region, initial encounter: Secondary | ICD-10-CM

## 2018-02-13 DIAGNOSIS — Z23 Encounter for immunization: Secondary | ICD-10-CM

## 2018-02-13 DIAGNOSIS — W010XXA Fall on same level from slipping, tripping and stumbling without subsequent striking against object, initial encounter: Secondary | ICD-10-CM

## 2018-02-13 DIAGNOSIS — Y9301 Activity, walking, marching and hiking: Secondary | ICD-10-CM

## 2018-02-13 DIAGNOSIS — Y92488 Other paved roadways as the place of occurrence of the external cause: Secondary | ICD-10-CM

## 2018-02-13 DIAGNOSIS — M810 Age-related osteoporosis without current pathological fracture: Secondary | ICD-10-CM

## 2018-02-13 DIAGNOSIS — R011 Cardiac murmur, unspecified: Secondary | ICD-10-CM

## 2018-02-13 DIAGNOSIS — Z87891 Personal history of nicotine dependence: Secondary | ICD-10-CM

## 2018-02-13 MED ORDER — CALCIUM CARBONATE-VITAMIN D 500-200 MG-UNIT PO TABS: 2.0000 | ORAL_TABLET | Freq: Every day | ORAL | 1 refills | Status: DC

## 2018-02-13 MED ORDER — OMEPRAZOLE 40 MG PO CPDR: 40.0000 mg | DELAYED_RELEASE_CAPSULE | Freq: Two times a day (BID) | ORAL | 1 refills | Status: DC

## 2018-02-13 NOTE — Progress Notes (Signed)
   CC: follow-up of HTN, worsened GERD, recent fall  HPI:  [name] is a 72 y.o. F with history of non-obstructive CAD, essential HTN, mixed hyperlipidemia, GERD, osteoporosis and history of tobacco use who presents today for follow-up of HTN, osteoporosis and also admits to a fall yesterday and some increased GERD symptoms for the past few weeks.  For details regarding today's visit and the status of their chronic medical issues, please refer to the assessment and plan.  Past Medical History:  Diagnosis Date  . APPENDECTOMY, HX OF 07/10/2006   Qualifier: Diagnosis of  By: Oretha Ellis    . CAD (coronary artery disease) 2007   nonobstructive, 30% LAD 02/2006  . COPD (chronic obstructive pulmonary disease) ([place])   . 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 GERD (gastroesophageal reflux disease)   . Hemorrhoids   . HLD (hyperlipidemia)   . Hypertension   . HYSTERECTOMY, HX OF 07/10/2006   Qualifier: Diagnosis of  By: Oretha Ellis    . Myocardial infarct, old    Review of Systems:   General: Denies fevers, chills, weight loss, excessive fatigue, headache, lightheadedness or dizziness  HEENT: Denies acute changes in vision, dysphagia, odynophagia  Cardiac: Denies CP, SOB, palpitations Pulmonary: Denies cough or wheeze Abd: Admits to reflux and nausea. Denies abdominal pain, melena, hematochezia Extremities: Admits to fall. Denies weakness or swelling  Physical Exam: General: Alert, in no acute distress. Pleasant and conversant HEENT: No icterus, injection or ptosis. No hoarseness or dysarthria Cardiac: RRR; faint systolic murmur. No rub or gallop Pulmonary: CTA BL with normal WOB on RA. Able to speak in complete sentences Abd: Soft, non-tender. +bs Extremities: Warm, perfused. No significant pedal edema. Several small abrasions from fall but none with significant erythema, purulence or obvious foreign body Neuro: Facial muscles  symmetric. PERRL. EOMI. Strength and sensation grossly intact. Normal gait.   Vitals:   02/13/18 1623 02/13/18 1700  BP: (!) 152/92 (!) 148/92  Pulse: 64 65  Temp: 98.3 F (36.8 C)   TempSrc: Oral   SpO2: 100%   Weight: 159 lb 11.2 oz (72.4 kg)   Height: 5\' 2"  (1.575 m)    Body mass index is 29.21 kg/m.  Assessment & Plan:   See Encounters Tab for problem based charting.  Patient discussed with Dr. Rebeca Alert

## 2018-02-13 NOTE — Patient Instructions (Addendum)
It was great seeing you today! Thank you for choosing [facility] for your primary care.   Today we talked about: 1) Stomach acid: I believe your symptoms are related to GERD as they get better with additional acid blocking medication. Please increase your Omeprazole to twice daily. Do not take this medication with your calcium supplement (it can decrease the calcium absorption).  2) Osteoporosis/fall: Please take Vitamin D and Calcium supplements. I've ordered a repeat bone scan to see if you still require additional medications. I will call you when these results are back 3) High blood pressure: Your blood pressure improved but did not meet goal on recheck. You have an appointment with Dr. Claiborne Billings in 1 month who may make changes to your medications.    I'll see you back in 3 months to follow up osteoporosis, GERD and high blood pressure. Of course I'm happy to see you sooner as needed!

## 2018-02-14 DIAGNOSIS — Z23 Encounter for immunization: Secondary | ICD-10-CM | POA: Insufficient documentation

## 2018-02-14 DIAGNOSIS — W19XXXA Unspecified fall, initial encounter: Secondary | ICD-10-CM | POA: Insufficient documentation

## 2018-02-14 NOTE — Assessment & Plan Note (Addendum)
Assessment:  Alexis Kline has history of osteoporosis without pathologic fracture to my knowledge which was previously managed with Vitamin D, Calcium and Alendronate. It seems this regimen was continued until 10/2016 when she experienced cervical muscle strain thought to be related to bisphosphonate therapy. 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 She was advised to resume this medication, although hesitant.   Alexis Kline was not aware she was to continue her regimen and has not been taking Fosamax, Calcium or Vitamin D supplements in ~1 yr. She had what seems to be a mechanical fall yesterday and fortunately has only a few abrasions and no significant joint pains.      Plan: We discussed the importance of treatment of osteoporosis with Vitamin D, Calcium and usually other agents like bisphosphonates. She is quite apprehensive about restarting Fosamax, and she does have worsened GERD symptoms today -- could consider bisphosphonate alternative. We are restarting Calcium and Vitamin D supplementation and will likely start another agent if repeat DEXA continues to suggest osteoporosis.  -Repeat DEXA -Calcium, Vitamin D

## 2018-02-14 NOTE — Assessment & Plan Note (Addendum)
Assessment:  BP 152/92 with pulse 64 which minimally improved on recheck to 148/92 with pulse 65. She is currently prescribed HCTZ 25mg  and Carvedilol 25mg  twice daily and has been on this regimen since Carvedilol was increased last fall by her cardiologist. She denies any chest pain, SOB, palpitations, headache or blurred vision but did have a fall yesterday (see separate problem) although this did not necessarily sound orthostatic or related to arrhythmogenicity.       Plan: Patients BP just above goal but pulse low-normal and had recent fall. I'm inheriting this patient from another provider and I do not see that she's been on an ACE-I/ARB previously. This could be considered if BP remains elevated at follow-up appointment given her history of CAD. Will continue current regimen for now and she has an appointment with cardiology within the next few weeks.

## 2018-02-14 NOTE — Assessment & Plan Note (Signed)
Patient received influenza vaccination today in clinic. Tolerated well without immediate complications.

## 2018-02-14 NOTE — Assessment & Plan Note (Addendum)
Assessment:  Patient with long history of GERD managed with Omeprazole 40mg  daily and occasional Famotidine as needed. Reports needing more Famotidine than usual for the past few weeks and is unsure why, as she feels sweets are the only foods that exacerbate her reflux. She admits to diet heavy in fatty/spicy foods and does not abstain from meals prior to bedtime. She denies any melena or hematochezia. Admits to history of ulcers but does not feel this is similar.      Plan: Good discussion on diet and lifestyle modifications today. Will increase Omeprazole to 40mg  twice daily for the next few weeks and see if this improves her symptoms. Advised patient not to take Calcium and Omeprazole together and to refrain from fatty foods given her history of elevated triglycerides and LDL.

## 2018-02-15 NOTE — Assessment & Plan Note (Signed)
Assessment:  Patient admits to falling yesterday while crossing the street. Reports she was in her usual state of health before and after fall and does not believe she tripped over anything. She was waiting to cross the street and upon ambulating, she feels like she was being pushed forward by someone. Admits to 1 similar episode many years ago. She did not hit her head and denied any AMS or LOC. Witnesses also deny LOC, AMS or seizure-like activity. She wears compression stockings daily and forgot to put them on yesterday and wonders if this could be related.   Plan: Fortunately she has only minor abrasions following her tumble yesterday. Fall sounds mechanical but could perhaps be related to orthostatic hypotension/venous pooling although I feel this is less likely. She feels well and has no LH/dizziness with standing. BP normal and pulse in the 60's. Asked patient to let us know if there are any further events and we can look into additional work-up.

## 2018-02-15 NOTE — Progress Notes (Signed)
[facility] Medicine Clinic Attending  Case discussed with Dr. Danford Bad soon after the resident saw the patient.  We reviewed the resident's history and exam and pertinent patient test results.  I agree with the assessment, diagnosis, and plan of care documented in the resident's note.  Lenice Pressman, M.D., Ph.D.

## 2018-02-16 ENCOUNTER — Telehealth: Payer: Self-pay | Admitting: *Deleted

## 2018-02-16 NOTE — Telephone Encounter (Signed)
Pt states when she woke up this morning , her throat is sore and "raw". She has tried salt/water rinses . Received flu shot on Tuesday. No fever.

## 2018-02-16 NOTE — Telephone Encounter (Signed)
Talked to Dr Rebeca Alert - does not believe flu shot caused her problem. Stated pt to continue mouth rinses (gargles), honey and tea ; and may have Ibuprofen - this was relayed to pt. Stated she will try. Also no available appts today. Instructed pt to go to UC/ED if symptoms worse over the w/e/holiday. And to call back on Tuesday if needed. She voiced understanding.

## 2018-02-26 ENCOUNTER — Ambulatory Visit: Payer: Medicare Other

## 2018-03-14 ENCOUNTER — Ambulatory Visit (INDEPENDENT_AMBULATORY_CARE_PROVIDER_SITE_OTHER): Payer: Medicare Other | Admitting: Cardiovascular Disease

## 2018-03-14 ENCOUNTER — Encounter: Payer: Self-pay | Admitting: Cardiovascular Disease

## 2018-03-14 ENCOUNTER — Encounter

## 2018-03-14 VITALS — BP 201/103 | HR 62 | Ht 62.0 in | Wt 161.4 lb

## 2018-03-14 DIAGNOSIS — E782 Mixed hyperlipidemia: Secondary | ICD-10-CM | POA: Diagnosis not present

## 2018-03-14 DIAGNOSIS — Z87891 Personal history of nicotine dependence: Secondary | ICD-10-CM | POA: Diagnosis not present

## 2018-03-14 DIAGNOSIS — Z79899 Other long term (current) drug therapy: Secondary | ICD-10-CM

## 2018-03-14 DIAGNOSIS — I25119 Atherosclerotic heart disease of native coronary artery with unspecified angina pectoris: Secondary | ICD-10-CM | POA: Diagnosis not present

## 2018-03-14 DIAGNOSIS — I10 Essential (primary) hypertension: Secondary | ICD-10-CM

## 2018-03-14 MED ORDER — AMLODIPINE BESYLATE 5 MG PO TABS: 5.0000 mg | ORAL_TABLET | Freq: Every day | ORAL | 3 refills | Status: DC

## 2018-03-14 NOTE — Progress Notes (Signed)
Cardiology Office Note    Date:  03/14/2018   ID:  Alexis Kline, DOB 1945/07/16, MRN [id number]  PCP:  Einar Gip, DO  Cardiologist:  Shelva Majestic, MD   No chief complaint on file.   History of Present Illness:  Alexis Kline is a 72 y.o. female who presents for a 48-monthfollow-up cardiology evaluation  Alexis Kline has a greater than 30 year history of hypertension, a history of hyperlipidemia, as well as GERD.  She also has a history of osteopenia.  Recently, she developed a bandlike chest pressure around her upper chest without radiation that occurred at rest and lasted for 15-20 minutes with spontaneous resolution.  Her symptoms were also associated with shortness of breath and she felt that she could not catch her breath.  She denies any classic exertional symptomatology.  Reportedly also has a history of chronic stable angina, and remotely had undergone cardiac catheterization.  In 2012 .  She was found to have 30% areas of segmental disease in her LAD.  Smoking cessation was advised.  She was treated medically.  She has a history of bilateral neck pain radiating to her shoulders and has chronic cervical of musculoskeletal discomfort from traumatic injury over 20 years ago.  She was recently evaluated by Dr. BQuay Burowand with her recent chest pain symptomatology she is now referred for cardiology evaluation.  Of note, the patient states she drinks at least 8 cups of coffee per day.  She admits to occasional palpitations.  When I initially saw her, we discussed significant reduction in caffeine intake.  Her blood pressure was upper normal in width.  Her chest pain.  I recommended titration of carvedilol to 18.75 mg twice a day.  She underwent an echo Doppler study on 11/09/2016 which showed hyperdynamic LV function with an EF of 65-70%.  There was grade 1 diastolic dysfunction.  There was mild mitral annular calcification.  A nuclear perfusion study showed an EF of 81%.   There were no ECG changes during stress.  She had normal perfusion.  Laboratory showed a hemoglobin of 12.7 and hematocrit of 40.8.  TSH was normal at 1.49.   I last saw her in November 2018.  At that time she was under significant stress to her daughter who is 566years old had just been diagnosed with stage IV lung cancer on April 19, 2017.  Fortunately, her daughter died on D01-01-2018  Past year, she has felt well but recently has noticed significant blood pressure lability.  She has not had recent laboratory.  She denies any episodes of chest tightness or significant shortness of breath.  She has been on hydrochlorothiazide 25 mg and carvedilol 25 mg twice a day for blood pressure.  She has been on atorvastatin 40 mg daily.  She has GERD on omeprazole.  She presents for reevaluation   Past Medical History:  Diagnosis Date  . APPENDECTOMY, HX OF 07/10/2006   Qualifier: Diagnosis of  By: AOretha Ellis   . CAD (coronary artery disease) 2007   nonobstructive, 30% LAD 02/2006  . COPD (chronic obstructive pulmonary disease) ([place]   . Fibroadenoma 2008   right breast - [place]  . GERD (gastroesophageal reflux disease)   . Hemorrhoids   . HLD (hyperlipidemia)   . Hypertension   . HYSTERECTOMY, HX OF 07/10/2006   Qualifier: Diagnosis of  By: AOretha Ellis   . Myocardial infarct, old  Past Surgical History:  Procedure Laterality Date  . ABDOMINAL HYSTERECTOMY  1988  . APPENDECTOMY  1966  . BLADDER SUSPENSION  1997  . BREAST EXCISIONAL BIOPSY Right 1988  . BREAST LUMPECTOMY     right breat  . [place]   3 operations in right hand and 2 operations in left hand. Dr. Redmond Pulling in [place]  . PTCA  2007  . rectal stapling  2008   Done by Dr. Marlou Starks  . RECTAL SURGERY  01/07/2007   Dr. Marlou Starks in [place].   Marland Kitchen SHOULDER SURGERY  1991   Right shoulder surgery in 1991 and left shoulder surgery in 1992 by Dr. Redmond Pulling in [place].     Current Medications: Outpatient Medications Prior to Visit  Medication Sig Dispense Refill  . aspirin 81 MG EC tablet Take 1 tablet (81 mg total) by mouth daily. 90 tablet 3  . ASPIRIN-ACETAMINOPHEN-CAFFEINE PO Take by mouth as needed.    Marland Kitchen atorvastatin (LIPITOR) 40 MG tablet Take 1 tablet (40 mg total) by mouth daily. NEED OV. 90 tablet 0  . B Complex-Biotin-FA (SUPER B-COMPLEX PO) Take 1 tablet by mouth daily.      . calcium-vitamin D (OSCAL 500/200 D-3) 500-200 MG-UNIT tablet Take 2 tablets by mouth daily with breakfast. 90 tablet 1  . carvedilol (COREG) 25 MG tablet Take 1 tablet (25 mg total) by mouth 2 (two) times daily with a meal. 180 tablet 3  . hydrochlorothiazide (HYDRODIURIL) 25 MG tablet Take 1 tablet (25 mg total) by mouth daily. NEED OV. 90 tablet 0  . nitroGLYCERIN (NITROSTAT) 0.4 MG SL tablet Place 1 tablet (0.4 mg total) under the tongue every 5 (five) minutes. up to 3 times as needed for chest pain 30 tablet 0  . omeprazole (PRILOSEC) 40 MG capsule Take 1 capsule (40 mg total) by mouth 2 (two) times daily. TAKE (1) CAPSULE DAILY. (Patient taking differently: Take 40 mg by mouth daily. TAKE (1) CAPSULE DAILY.) 180 capsule 1  . senna-docusate (SENNALAX-S) 8.6-50 MG tablet Take 2 tablets by mouth daily as needed (for constipation). 60 tablet 5  . diclofenac sodium (VOLTAREN) 1 % GEL Apply 2 g topically 3 (three) times daily as needed. Apply to the pain in your left shoulder as we discussed. 1 Tube 1   No facility-administered medications prior to visit.      Allergies:   [name] isothiocyanate]; Iodine; Other; and Tape   Social History   Socioeconomic History  . Marital status: Widowed    Spouse name: Not on file  . Number of children: Not on file  . Years of education: Not on file  . Highest education level: Not on file  Occupational History  . Not on file  Social Needs  . Financial resource strain: Not on file  . Food insecurity:    Worry: Not on file     Inability: Not on file  . Transportation needs:    Medical: Not on file    Non-medical: Not on file  Tobacco Use  . Smoking status: Former Smoker    Years: 54.00    Types: Cigarettes    Last attempt to quit: 06/20/2013    Years since quitting: 4.7  . Smokeless tobacco: Never Used  Substance and Sexual Activity  . Alcohol use: Yes    Alcohol/week: 0.0 standard drinks    Comment: rarely "maybe a drink of New Year's"  . Drug use: No  . Sexual activity: Not Currently  Lifestyle  . Physical activity:    Days per week: Not on file    Minutes per session: Not on file  . Stress: Not on file  Relationships  . Social connections:    Talks on phone: Not on file    Gets together: Not on file    Attends religious service: Not on file    Active member of club or organization: Not on file    Attends meetings of clubs or organizations: Not on file    Relationship status: Not on file  Other Topics Concern  . Not on file  Social History Narrative  . 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  She is widowed for 20 years.  She had 2 children, 4 grandchildren, and 11 great grandchildren.  She lives by herself.  She has 2 years of college.  She retired in 1970.  She previously had smoked for 55 years.  She does walk approximately 3-4 days per week.  Family History:  The patient's family history includes Heart disease in her sister; Hyperlipidemia in her father and mother; Hypertension in her father and mother.   ROS General: Negative; No fevers, chills, or night sweats;  HEENT: Negative; No changes in vision or hearing, sinus congestion, difficulty swallowing Pulmonary: Negative; No cough, wheezing, shortness of breath, hemoptysis Cardiovascular: see HPI GI: Positive for GERD GU: Negative; No dysuria, hematuria, or difficulty voiding Musculoskeletal: Chronic neck and arm pain since her remote trauma. Hematologic/Oncology: Negative; no easy  bruising, bleeding Endocrine: Negative; no heat/cold intolerance; no diabetes Neuro: Negative; no changes in balance, headaches Skin: Negative; No rashes or skin lesions Psychiatric: Negative; No behavioral problems, depression Sleep: Negative; No snoring, daytime sleepiness, hypersomnolence, bruxism, restless legs, hypnogognic hallucinations, no cataplexy Other comprehensive 14 point system review is negative.   PHYSICAL EXAM:   VS:  BP (!) 201/103   Pulse 62   Ht '5\' 2"'$  (1.575 m)   Wt 161 lb 6.4 oz (73.2 kg)   LMP 06/20/1986   BMI 29.52 kg/m     Repeat blood pressure remained elevated but was improved at 178/90.  Wt Readings from Last 3 Encounters:  03/14/18 161 lb 6.4 oz (73.2 kg)  02/13/18 159 lb 11.2 oz (72.4 kg)  05/17/17 170 lb 6.4 oz (77.3 kg)    General: Alert, oriented, no distress.  Skin: normal turgor, no rashes, warm and dry HEENT: Normocephalic, atraumatic. Pupils equal round and reactive to light; sclera anicteric; extraocular muscles intact; Nose without nasal septal hypertrophy Mouth/Parynx benign; Mallinpatti scale 3 Neck: No JVD, no carotid bruits; normal carotid upstroke Lungs: clear to ausculatation and percussion; no wheezing or rales Chest wall: without tenderness to palpitation Heart: PMI not displaced, RRR, s1 s2 normal, 1/6 systolic murmur, no diastolic murmur, no rubs, gallops, thrills, or heaves Abdomen: soft, nontender; no hepatosplenomehaly, BS+; abdominal aorta nontender and not dilated by palpation. Back: no CVA tenderness Pulses 2+ Musculoskeletal: full range of motion, normal strength, no joint deformities Extremities: no clubbing cyanosis or edema, Homan's sign negative  Neurologic: grossly nonfocal; Cranial nerves grossly wnl Psychologic: Normal mood and affect   Studies/Labs Reviewed:   EKG:  EKG is ordered today. ECG (independently read by me): Normal sinus rhythm at 62 bpm.  Poor anterior R wave progression.  Normal intervals.  No  ectopy.  ECG (independently read by me): Normal sinus rhythm at 74 bpm.  QS complex.  Anteroseptally  11/22/2016 ECG (independently  read by me): Normal sinus rhythm at 79 bpm.  No ectopy.  Normal intervals.  No significant ST changes.  10/26/2016 ECG (independently read by me): Normal sinus rhythm at 65 bpm.  Normal intervals.  No ectopy.  No significant ST-T changes.  Recent Labs: BMP Latest Ref Rng & Units 10/19/2016 02/03/2016 01/01/2014  Glucose 65 - 99 mg/dL 98 91 89  BUN 8 - 27 mg/dL '16 12 10  '$ Creatinine 0.57 - 1.00 mg/dL 0.64 0.84 0.77  BUN/Creat Ratio 12 - '28 25 14 '$ -  Sodium 134 - 144 mmol/L 140 142 138  Potassium 3.5 - 5.2 mmol/L 3.7 4.0 3.6  Chloride 96 - 106 mmol/L 100 100 103  CO2 18 - 29 mmol/L '24 25 28  '$ Calcium 8.7 - 10.3 mg/dL 9.6 9.8 9.3     Hepatic Function Latest Ref Rng & Units 01/02/2014 07/28/2010 01/14/2009  Total Protein 6.0 - 8.3 g/dL 6.6 6.8 7.6  Albumin 3.5 - 5.2 g/dL 3.8 4.0 4.3  AST 0 - 37 U/L '27 18 28  '$ ALT 0 - 35 U/L '16 9 18  '$ Alk Phosphatase 39 - 117 U/L 49 55 63  Total Bilirubin 0.2 - 1.2 mg/dL 0.4 0.4 0.5  Bilirubin, Direct 0.0 - 0.3 mg/dL 0.1 - -    CBC Latest Ref Rng & Units 10/26/2016 02/03/2016 01/02/2014  WBC 3.8 - 10.8 K/uL 7.9 7.5 5.7  Hemoglobin 11.7 - 15.5 g/dL 12.7 12.2 11.4(L)  Hematocrit 35.0 - 45.0 % 40.8 38.7 35.7(L)  Platelets 140 - 400 K/uL 292 275 295   Lab Results  Component Value Date   MCV 80.2 10/26/2016   MCV 75 (L) 02/03/2016   MCV 71.3 (L) 01/02/2014   Lab Results  Component Value Date   TSH 1.49 10/26/2016   No results found for: HGBA1C   BNP No results found for: BNP  ProBNP No results found for: PROBNP   Lipid Panel     Component Value Date/Time   CHOL 154 10/19/2016 1401   TRIG 256 (H) 10/19/2016 1401   HDL 43 10/19/2016 1401   CHOLHDL 3.6 10/19/2016 1401   CHOLHDL 3.8 01/01/2014 1433   VLDL 38 01/01/2014 1433   LDLCALC 60 10/19/2016 1401     RADIOLOGY: No results found.   Additional studies/  records that were reviewed today include:  I reviewed the patient's prior cardiac catheterization from 11/03/2010.  I reviewed recent office records.   I reviewed her nuclear perfusion study, as well as her echo Doppler study and  laboratory.  ASSESSMENT:    1. Coronary artery disease involving native coronary artery of native heart with angina pectoris ([place])   2. Mixed hyperlipidemia   3. Essential hypertension   4. Medication management      PLAN:  [name] is a 72 year old female has a history of significant tobacco abuse, having smoked for 55 years, as well as a greater than 30 year history of hypertension.  She was found to have mild nonobstructive CAD at cardiac catheterization in May 2012.  She has a history of hyperlipidemia.  She has not had any recent anginal type symptoms.  Her nuclear perfusion study in May 2018 showed normal perfusion with hyperdynamic ejection fraction and normal wall motion.  Her blood pressure today is significantly elevated despite taking carvedilol and HCTZ.  I am adding amlodipine 5 mg initially to her medical regimen.  I suspect she may require additional treatment.  He has not had recent laboratory.  She has been on  atorvastatin 40 mg for hyperlipidemia.  Her last lipid panel in May 2018 revealed an LDL of 60.  However triglycerides were elevated at 256.  Since she has not had any recent laboratory complete set of fasting laboratory will be obtained.  She has GERD which has been controlled with omeprazole.  Unfortunately her daughter died after only 6 weeks following a diagnosis of stage IV lung cancer.  Initially she significant grieving but this has improved.  I recommended she follow-up in 2 months and be seen by an APP in our office.  Her blood pressure is still elevated, I would recommend further titration of amlodipine up to 10 mg or initiation of ACE or ARB therapy.  I will see her in 4 months for reevaluation.     Medication Adjustments/Labs and Tests  Ordered: Current medicines are reviewed at length with the patient today.  Concerns regarding medicines are outlined above.  Medication changes, Labs and Tests ordered today are listed in the Patient Instructions below. Patient Instructions  Medication Instructions:  START amlodipine 5 mg daily  Labwork: Please return for FASTING labs (CMET, CBC, Lipid, TSH)  Our in office lab hours are Monday-Friday 8:00-4:00, closed for lunch 12:45-1:45 pm.  No appointment needed.  Follow-Up: 2 months with PA/NP  4 months with Dr. Claiborne Billings  Any Other Special Instructions Will Be Listed Below (If Applicable).     If you need a refill on your cardiac medications before your next appointment, please call your pharmacy.       Signed, Shelva Majestic, MD  03/14/2018 5:33 PM    [facility] [address] [place], [place], [place]  [address] Phone: [phone]

## 2018-03-14 NOTE — Patient Instructions (Signed)
Medication Instructions:  START amlodipine 5 mg daily  Labwork: Please return for FASTING labs (CMET, CBC, Lipid, TSH)  Our in office lab hours are Monday-Friday 8:00-4:00, closed for lunch 12:45-1:45 pm.  No appointment needed.  Follow-Up: 2 months with PA/NP  4 months with Dr. Claiborne Billings  Any Other Special Instructions Will Be Listed Below (If Applicable).     If you need a refill on your cardiac medications before your next appointment, please call your pharmacy.

## 2018-03-15 ENCOUNTER — Telehealth: Payer: Self-pay | Admitting: *Deleted

## 2018-03-15 NOTE — Telephone Encounter (Signed)
Left message for patient to call and schedule 2 month appt with APP and 4 month appt with Dr. Claiborne Billings

## 2018-03-25 ENCOUNTER — Encounter: Payer: Self-pay | Admitting: *Deleted

## 2018-03-28 ENCOUNTER — Other Ambulatory Visit (INDEPENDENT_AMBULATORY_CARE_PROVIDER_SITE_OTHER): Payer: Medicare Other

## 2018-03-28 DIAGNOSIS — E782 Mixed hyperlipidemia: Secondary | ICD-10-CM | POA: Diagnosis not present

## 2018-03-28 DIAGNOSIS — I25119 Atherosclerotic heart disease of native coronary artery with unspecified angina pectoris: Secondary | ICD-10-CM | POA: Diagnosis not present

## 2018-03-28 DIAGNOSIS — Z79899 Other long term (current) drug therapy: Secondary | ICD-10-CM

## 2018-03-28 DIAGNOSIS — I10 Essential (primary) hypertension: Secondary | ICD-10-CM

## 2018-03-29 LAB — CMP14 + ANION GAP
ALT: 31 IU/L (ref 0–32)
AST: 40 IU/L (ref 0–40)
Albumin/Globulin Ratio: 1.5 (ref 1.2–2.2)
Albumin: 4 g/dL (ref 3.5–4.8)
Alkaline Phosphatase: 54 IU/L (ref 39–117)
Anion Gap: 20 mmol/L — ABNORMAL HIGH (ref 10.0–18.0)
BUN/Creatinine Ratio: 18 (ref 12–28)
BUN: 13 mg/dL (ref 8–27)
Bilirubin Total: 0.3 mg/dL (ref 0.0–1.2)
CO2: 26 mmol/L (ref 20–29)
Calcium: 9.8 mg/dL (ref 8.7–10.3)
Chloride: 102 mmol/L (ref 96–106)
Creatinine, Ser: 0.72 mg/dL (ref 0.57–1.00)
GFR calc Af Amer: 97 mL/min/{1.73_m2} (ref >59)
GFR calc non Af Amer: 85 mL/min/{1.73_m2} (ref >59)
Globulin, Total: 2.6 g/dL (ref 1.5–4.5)
Glucose: 89 mg/dL (ref 65–99)
Potassium: 4.4 mmol/L (ref 3.5–5.2)
Sodium: 148 mmol/L — ABNORMAL HIGH (ref 134–144)
Total Protein: 6.6 g/dL (ref 6.0–8.5)

## 2018-03-29 LAB — LIPID PANEL
Chol/HDL Ratio: 2.9 ratio (ref 0.0–4.4)
Cholesterol, Total: 146 mg/dL (ref 100–199)
HDL: 50 mg/dL (ref >39)
LDL Calculated: 71 mg/dL (ref 0–99)
Triglycerides: 124 mg/dL (ref 0–149)
VLDL Cholesterol Cal: 25 mg/dL (ref 5–40)

## 2018-03-29 LAB — CBC
Hematocrit: 41.9 % (ref 34.0–46.6)
Hemoglobin: 13.1 g/dL (ref 11.1–15.9)
MCH: 25.7 pg — ABNORMAL LOW (ref 26.6–33.0)
MCHC: 31.3 g/dL — ABNORMAL LOW (ref 31.5–35.7)
MCV: 82 fL (ref 79–97)
Platelets: 269 10*3/uL (ref 150–450)
RBC: 5.09 x10E6/uL (ref 3.77–5.28)
RDW: 14.9 % (ref 12.3–15.4)
WBC: 6.3 10*3/uL (ref 3.4–10.8)

## 2018-03-29 LAB — TSH: TSH: 0.907 u[IU]/mL (ref 0.450–4.500)

## 2018-04-05 ENCOUNTER — Other Ambulatory Visit: Payer: Self-pay | Admitting: Cardiovascular Disease

## 2018-04-05 DIAGNOSIS — I2510 Atherosclerotic heart disease of native coronary artery without angina pectoris: Secondary | ICD-10-CM

## 2018-04-10 ENCOUNTER — Inpatient Hospital Stay: Admission: RE | Admit: 2018-04-10 | Payer: Medicare Other | Source: Ambulatory Visit

## 2018-04-10 ENCOUNTER — Ambulatory Visit: Payer: Medicare Other

## 2018-04-12 ENCOUNTER — Other Ambulatory Visit: Payer: Self-pay

## 2018-04-12 ENCOUNTER — Ambulatory Visit (INDEPENDENT_AMBULATORY_CARE_PROVIDER_SITE_OTHER): Payer: Medicare Other | Admitting: Internal Medicine

## 2018-04-12 ENCOUNTER — Encounter: Payer: Self-pay | Admitting: Internal Medicine

## 2018-04-12 VITALS — BP 139/78 | HR 70 | Temp 98.2°F | Ht 62.0 in | Wt 160.6 lb

## 2018-04-12 DIAGNOSIS — K219 Gastro-esophageal reflux disease without esophagitis: Secondary | ICD-10-CM | POA: Diagnosis not present

## 2018-04-12 DIAGNOSIS — Z888 Allergy status to other drugs, medicaments and biological substances status: Secondary | ICD-10-CM

## 2018-04-12 DIAGNOSIS — Z79899 Other long term (current) drug therapy: Secondary | ICD-10-CM

## 2018-04-12 DIAGNOSIS — I10 Essential (primary) hypertension: Secondary | ICD-10-CM

## 2018-04-12 DIAGNOSIS — R0781 Pleurodynia: Secondary | ICD-10-CM | POA: Diagnosis not present

## 2018-04-12 LAB — D-DIMER, QUANTITATIVE: D-Dimer, Quant: 1.02 ug/mL-FEU — ABNORMAL HIGH (ref 0.00–0.50)

## 2018-04-12 MED ORDER — DICLOFENAC SODIUM 1 % TD GEL: 4.0000 g | Freq: Four times a day (QID) | TRANSDERMAL | 4 refills | Status: DC

## 2018-04-12 NOTE — Progress Notes (Signed)
   CC: chest pain   HPI:  [name] is a 72 y.o. female with PMHx HTN, GERD who presents for 3 and a half week history of left sided back pain that she describes as in her lung. Pain is worse with breathing and endorses associated shortness of breath. It is waxing and waning in nature and non-radiating. She describes it as a cat scratching her on the inside. It makes it difficult for her to sleep. She has been taking Tylenol for the pain with minimal relief. Denies fevers, chills, recent travel, cough, hemoptysis, syncope.   Past Medical History:  Diagnosis Date  . APPENDECTOMY, HX OF 07/10/2006   Qualifier: Diagnosis of  By: Oretha Ellis    . CAD (coronary artery disease) 2007   nonobstructive, 30% LAD 02/2006  . COPD (chronic obstructive pulmonary disease) ([place])   . 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 GERD (gastroesophageal reflux disease)   . Hemorrhoids   . HLD (hyperlipidemia)   . Hypertension   . HYSTERECTOMY, HX OF 07/10/2006   Qualifier: Diagnosis of  By: Oretha Ellis    . Myocardial infarct, old    Review of Systems:  Per HPI   Physical Exam:  Vitals:   04/12/18 1534  BP: 139/78  Pulse: 70  Temp: 98.2 F (36.8 C)  TempSrc: Oral  SpO2: 99%  Weight: 160 lb 9.6 oz (72.8 kg)  Height: 5\' 2"  (1.575 m)   General: alert, appears uncomfortable due to pain  CV: RRR; no murmurs, rubs or gallops Pulm: seems to splint while breathing due to pain; good air movement; no wheezes, crackles or rubs  MSK: TTP along left paraspinal and intercostal muscles around T7.  Skin: no rash   Assessment & Plan:   See Encounters Tab for problem based charting.  Patient seen with Dr. Rebeca Alert

## 2018-04-12 NOTE — Patient Instructions (Addendum)
Alexis Kline, It was nice meeting you. I look forward to serving as your primary care doctor for the next few years. I am so sorry you are having this severe pain. I think what is likely going on is severe inflammation in that area, whether it be in the muscles and ribs or the lining of the lung itself. I want you to take Ibuprofen 600 mg three times daily with meals for 2 weeks to see if this helps your symptoms. You can also apply this prescription gel to the area up to 4 times a day as needed.  I am going to order a chest x-ray and blood test to make sure there is not a collapsed lung, blood clot or any bone abnormalities.   Please follow-up in 1-2 weeks if your pain is not any better.  Feel free to call any time with questions or concerns.

## 2018-04-13 ENCOUNTER — Telehealth: Payer: Self-pay | Admitting: Internal Medicine

## 2018-04-13 MED ORDER — DIPHENHYDRAMINE HCL 50 MG PO TABS: 50.0000 mg | ORAL_TABLET | Freq: Once | ORAL | 0 refills | Status: DC

## 2018-04-13 MED ORDER — PREDNISONE 50 MG PO TABS: ORAL_TABLET | ORAL | 0 refills | Status: DC

## 2018-04-13 NOTE — Telephone Encounter (Signed)
Spoke with patient regarding 13 hour prep that was recommended prior to receiving IV contrast. Due to transportation issues she is not able to get to her pharmacy and has her medications delivered to her only on Tues/Thurs. The CT is scheduled for Monday at 10:30. Per chart review and patient report, she has received IV contrast for CT scan before without receiving Prednisone or Benadryl beforehand. Will proceed with CT scan on Monday.

## 2018-04-13 NOTE — Telephone Encounter (Signed)
Called Alexis Kline to let her know that her d-dimer came back elevated, and I am changing the chest x-ray to a CT chest with contrast to rule out PE. I have ordered this urgently so should be done within the next 24-48 hours.  Of note, she had a similar complaint 4 years ago. D-dimer elevated at this time as well. CT chest was negative.

## 2018-04-15 ENCOUNTER — Encounter: Payer: Self-pay | Admitting: Internal Medicine

## 2018-04-15 NOTE — Assessment & Plan Note (Signed)
Blood pressure well controlled. Continue Norvasc 5 mg, Coreg 25 mg, HCTZ 25 mg.

## 2018-04-15 NOTE — Assessment & Plan Note (Signed)
Patient complains of 3.5 week history of pleuritic chest pain. Lower suspicion for PE given duration of symptoms, normal O2 sats and heart rate, no signs of symptoms of DVT. D-dimer ordered which came back elevated at 1.02. Proceeded with CT chest with contrast to evaluate for PE.  She has no other signs or symptoms consistent with infectious process.  If CT is negative, it is most likely musculoskeletal. Patient is reluctant to take any prescription medications. Instructed her to take Ibuprofen 600 mg TID x2 weeks to treat inflammatory process and hopefully decrease her symptoms. Follow-up in 2 weeks.

## 2018-04-16 ENCOUNTER — Other Ambulatory Visit: Payer: Self-pay | Admitting: Internal Medicine

## 2018-04-16 ENCOUNTER — Ambulatory Visit (HOSPITAL_COMMUNITY)
Admission: RE | Admit: 2018-04-16 | Discharge: 2018-04-16 | Disposition: A | Discharge status: Home / Self Care | Payer: Medicare Other | Source: Ambulatory Visit | Attending: Internal Medicine | Admitting: Internal Medicine

## 2018-04-16 DIAGNOSIS — R0602 Shortness of breath: Secondary | ICD-10-CM

## 2018-04-16 DIAGNOSIS — R0781 Pleurodynia: Secondary | ICD-10-CM | POA: Insufficient documentation

## 2018-04-16 DIAGNOSIS — R0789 Other chest pain: Secondary | ICD-10-CM

## 2018-04-16 DIAGNOSIS — I2510 Atherosclerotic heart disease of native coronary artery without angina pectoris: Secondary | ICD-10-CM | POA: Diagnosis not present

## 2018-04-16 DIAGNOSIS — I700 Atherosclerosis of aorta: Secondary | ICD-10-CM | POA: Diagnosis not present

## 2018-04-16 DIAGNOSIS — R7989 Other specified abnormal findings of blood chemistry: Secondary | ICD-10-CM | POA: Diagnosis not present

## 2018-04-16 MED ORDER — SODIUM CHLORIDE 0.9 % IJ SOLN
INTRAMUSCULAR | Status: AC
  Filled 2018-04-16: qty 50

## 2018-04-16 MED ORDER — IOPAMIDOL (ISOVUE-370) INJECTION 76%
INTRAVENOUS | Status: AC
  Administered 2018-04-16: 71 mL
  Filled 2018-04-16: qty 100

## 2018-04-16 NOTE — Telephone Encounter (Signed)
Pt appt sch for 04/16/2018 @ [facility].  Patient has arrived.

## 2018-04-16 NOTE — Progress Notes (Signed)
Internal Medicine Clinic Attending  I saw and evaluated the patient.  I personally confirmed the key portions of the history and exam documented by Dr. Bloomfield and I reviewed pertinent patient test results.  The assessment, diagnosis, and plan were formulated together and I agree with the documentation in the resident's note.  Alexander Raines, M.D., Ph.D.  

## 2018-04-18 ENCOUNTER — Telehealth: Payer: Self-pay | Admitting: Internal Medicine

## 2018-04-18 NOTE — Telephone Encounter (Signed)
Called patient to let her know that her CT did not show any blood clots in the lungs or other acute findings to explain her symptoms. Explained that this is most likely musculoskeletal and should get better with time. She does not like taking any prescribed medications so encouraged her to continue with Ibuprofen as needed.

## 2018-05-07 ENCOUNTER — Ambulatory Visit (INDEPENDENT_AMBULATORY_CARE_PROVIDER_SITE_OTHER): Payer: Medicare Other | Admitting: Adult Health

## 2018-05-07 ENCOUNTER — Encounter: Payer: Self-pay | Admitting: Adult Health

## 2018-05-07 VITALS — BP 118/68 | HR 68 | Ht 62.0 in | Wt 160.4 lb

## 2018-05-07 DIAGNOSIS — I2510 Atherosclerotic heart disease of native coronary artery without angina pectoris: Secondary | ICD-10-CM

## 2018-05-07 DIAGNOSIS — E7800 Pure hypercholesterolemia, unspecified: Secondary | ICD-10-CM | POA: Diagnosis not present

## 2018-05-07 DIAGNOSIS — Z79899 Other long term (current) drug therapy: Secondary | ICD-10-CM

## 2018-05-07 DIAGNOSIS — I10 Essential (primary) hypertension: Secondary | ICD-10-CM | POA: Diagnosis not present

## 2018-05-07 MED ORDER — HYDROCHLOROTHIAZIDE 25 MG PO TABS: 25.0000 mg | ORAL_TABLET | Freq: Every day | ORAL | 1 refills | Status: DC

## 2018-05-07 NOTE — Progress Notes (Signed)
Cardiology Office Note   Date:  05/07/2018   ID:  Alexis Kline, DOB May 15, 1946, MRN [id number]  PCP:  Delice Bison, DO  Cardiologist:  [facility]   Chief Complaint  Patient presents with  . Follow-up  . Hypertension     History of Present Illness: Alexis Kline is a 72 y.o. female who presents for ongoing assessment and management of HTN, HL, non-obstructive CAD per cardiac cath in 2012, stress myoview in 2018.Marland Kitchen Other history includes GERD  Caffeine abuse, and tobacco abuse. On last office visit, BP was not well controlled and therefore amlodipine was increased to 10 mg daily.   She comes today with multiple somatic complaints. She is not taking amlodipine 10 mg, but 5 mg daily. She complaints of neck pain, shoulder pain, and fatigue. Denies dizziness, chest pain or dyspnea.   Past Medical History:  Diagnosis Date  . APPENDECTOMY, HX OF 07/10/2006   Qualifier: Diagnosis of  By: Oretha Ellis    . CAD (coronary artery disease) 2007   nonobstructive, 30% LAD 02/2006  . COPD (chronic obstructive pulmonary disease) ([place])   . 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 GERD (gastroesophageal reflux disease)   . Hemorrhoids   . HLD (hyperlipidemia)   . Hypertension   . HYSTERECTOMY, HX OF 07/10/2006   Qualifier: Diagnosis of  By: Oretha Ellis    . Myocardial infarct, old     Past Surgical History:  Procedure Laterality Date  . ABDOMINAL HYSTERECTOMY  1988  . APPENDECTOMY  1966  . BLADDER SUSPENSION  1997  . BREAST EXCISIONAL BIOPSY Right 1988  . BREAST LUMPECTOMY     right breat  . [place]   3 operations in right hand and 2 operations in left hand. Dr. Redmond Pulling in [place]  . PTCA  2007  . rectal stapling  2008   Done by Dr. Marlou Starks  . RECTAL SURGERY  01/07/2007   Dr. Marlou Starks in [place].   Marland Kitchen SHOULDER SURGERY  1991   Right shoulder surgery in 1991 and left shoulder surgery in 1992 by Dr. Redmond Pulling in [place].       Current Outpatient Medications  Medication Sig Dispense Refill  . amLODipine (NORVASC) 5 MG tablet Take 1 tablet (5 mg total) by mouth daily. 90 tablet 3  . aspirin 81 MG EC tablet Take 1 tablet (81 mg total) by mouth daily. 90 tablet 3  . ASPIRIN-ACETAMINOPHEN-CAFFEINE PO Take by mouth as needed.    Marland Kitchen atorvastatin (LIPITOR) 40 MG tablet TAKE 1 TABLET ONCE DAILY. 90 tablet 2  . B Complex-Biotin-FA (SUPER B-COMPLEX PO) Take 1 tablet by mouth daily.      . calcium-vitamin D (OSCAL 500/200 D-3) 500-200 MG-UNIT tablet Take 2 tablets by mouth daily with breakfast. 90 tablet 1  . carvedilol (COREG) 25 MG tablet Take 1 tablet (25 mg total) by mouth 2 (two) times daily with a meal. 180 tablet 3  . diclofenac sodium (VOLTAREN) 1 % GEL Apply 4 g topically 4 (four) times daily. 100 g 4  . hydrochlorothiazide (HYDRODIURIL) 25 MG tablet Take 1 tablet (25 mg total) by mouth daily. 90 tablet 1  . nitroGLYCERIN (NITROSTAT) 0.4 MG SL tablet Place 1 tablet (0.4 mg total) under the tongue every 5 (five) minutes. up to 3 times as needed for chest pain 30 tablet 0  . omeprazole (PRILOSEC) 40 MG capsule Take 1 capsule (40 mg total) by mouth  2 (two) times daily. TAKE (1) CAPSULE DAILY. (Patient taking differently: Take 40 mg by mouth daily. TAKE (1) CAPSULE DAILY.) 180 capsule 1  . senna-docusate (SENNALAX-S) 8.6-50 MG tablet Take 2 tablets by mouth daily as needed (for constipation). 60 tablet 5   No current facility-administered medications for this visit.     Allergies:   [name] isothiocyanate]; Iodine; Other; and Tape    Social History:  The patient  reports that she quit smoking about 4 years ago. Her smoking use included cigarettes. She quit after 54.00 years of use. She has never used smokeless tobacco. She reports that she drinks alcohol. She reports that she does not use drugs.   Family History:  The patient's family history includes Heart disease in her sister; Hyperlipidemia in her father  and mother; Hypertension in her father and mother.    ROS: All other systems are reviewed and negative. Unless otherwise mentioned in H&P    PHYSICAL EXAM: VS:  BP 118/68   Pulse 68   Ht 5\' 2"  (1.575 m)   Wt 160 lb 6.4 oz (72.8 kg)   LMP 06/20/1986   BMI 29.34 kg/m  , BMI Body mass index is 29.34 kg/m. GEN: Well nourished, well developed, in no acute distress HEENT: normal Neck: no JVD, carotid bruits, or masses Cardiac: RRR; no murmurs, rubs, or gallops,no edema  Respiratory:  Clear to auscultation bilaterally, normal work of breathing GI: soft, nontender, nondistended, + BS MS: no deformity or atrophy Skin: warm and dry, no rash Neuro:  Strength and sensation are intact Psych: Flat affect.    EKG:  Not completed this office visit.   Recent Labs: 03/28/2018: ALT 31; BUN 13; Creatinine, Ser 0.72; Hemoglobin 13.1; Platelets 269; Potassium 4.4; Sodium 148; TSH 0.907    Lipid Panel    Component Value Date/Time   CHOL 146 03/28/2018 0850   TRIG 124 03/28/2018 0850   HDL 50 03/28/2018 0850   CHOLHDL 2.9 03/28/2018 0850   CHOLHDL 3.8 01/01/2014 1433   VLDL 38 01/01/2014 1433   LDLCALC 71 03/28/2018 0850      Wt Readings from Last 3 Encounters:  05/07/18 160 lb 6.4 oz (72.8 kg)  04/12/18 160 lb 9.6 oz (72.8 kg)  03/14/18 161 lb 6.4 oz (73.2 kg)      Other studies Reviewed: Echocardiogram 11/10/2017  Left ventricle: The cavity size was normal. Systolic function was   vigorous. The estimated ejection fraction was in the range of 65%   to 70%. Wall motion was normal; there were no regional wall   motion abnormalities. Doppler parameters are consistent with   abnormal left ventricular relaxation (grade 1 diastolic   dysfunction). - Mitral valve: Calcified annulus.  ASSESSMENT AND PLAN:  1.  Chronic chest pain: Non-obstructive disease per cardiac cath in 2012. Chest pain is atypical and now in the left shoulder and neck. I have advised her to follow up with PCP for  further testing, possibly cervical spine evaluation.   2. Hypertension: BP is better controlled. She is on amlodipine and HCTZ. No changes in this regimen.   3. Tobacco abuse: Stopped smoking 3 years ago.   4. Hyperlipidemia: She is to have follow up lipids and LFT's on 07/13/2018 prior to her follow up appointment on 07/17/2017.    Current medicines are reviewed at length with the patient today.    Labs/ tests ordered today include: BMET and lipids and LFT's.  Phill Myron. West Pugh, ANP, AACC   05/07/2018 4:04  PM    [facility] [address] Office [phone] Fax [phone]

## 2018-05-07 NOTE — Patient Instructions (Signed)
Follow-Up: You will need a follow up appointment in [place].    You may see  DR Tiana Loft, DNP, AACC -or- one of the following Advanced Practice Providers on your designated Care Team:    . Almyra Deforest, PA-C . Fabian Sharp, PA-C  Medication Instructions:  NO CHANGES- Your physician recommends that you continue on your current medications as directed. Please refer to the Current Medication list given to you today.  If you need a refill on your cardiac medications before your next appointment, please call your pharmacy.  Labwork: HAVE YOUR BMET,CBC AND LIPIDS DONE ON 07-13-2018   If you have labs (blood work) drawn today and your tests are completely normal, you will receive your results ONLY by: . MyChart Message (if you have MyChart) -OR- . A paper copy in the mail  At [facility], you and your health needs are our priority.  As part of our continuing mission to provide you with exceptional heart care, we have created designated Provider Care Teams.  These Care Teams include your primary Cardiologist (physician) and Advanced Practice Providers (APPs -  Physician Assistants and Nurse Practitioners) who all work together to provide you with the care you need, when you need it.  Thank you for choosing [facility] at [facility]!!

## 2018-05-15 ENCOUNTER — Encounter: Payer: Medicare Other | Admitting: Internal Medicine

## 2018-07-10 ENCOUNTER — Other Ambulatory Visit: Payer: Self-pay | Admitting: Cardiovascular Disease

## 2018-07-13 ENCOUNTER — Telehealth: Payer: Self-pay | Admitting: Cardiovascular Disease

## 2018-07-13 DIAGNOSIS — I10 Essential (primary) hypertension: Secondary | ICD-10-CM | POA: Diagnosis not present

## 2018-07-13 DIAGNOSIS — Z79899 Other long term (current) drug therapy: Secondary | ICD-10-CM | POA: Diagnosis not present

## 2018-07-13 LAB — BASIC METABOLIC PANEL
BUN/Creatinine Ratio: 18 (ref 12–28)
BUN: 13 mg/dL (ref 8–27)
CO2: 23 mmol/L (ref 20–29)
Calcium: 9.6 mg/dL (ref 8.7–10.3)
Chloride: 102 mmol/L (ref 96–106)
Creatinine, Ser: 0.73 mg/dL (ref 0.57–1.00)
GFR calc Af Amer: 95 mL/min/{1.73_m2} (ref >59)
GFR calc non Af Amer: 83 mL/min/{1.73_m2} (ref >59)
Glucose: 94 mg/dL (ref 65–99)
Potassium: 3.8 mmol/L (ref 3.5–5.2)
Sodium: 142 mmol/L (ref 134–144)

## 2018-07-13 LAB — HEPATIC FUNCTION PANEL
ALT: 32 IU/L (ref 0–32)
AST: 45 IU/L — ABNORMAL HIGH (ref 0–40)
Albumin: 4.1 g/dL (ref 3.7–4.7)
Alkaline Phosphatase: 56 IU/L (ref 39–117)
Bilirubin Total: 0.5 mg/dL (ref 0.0–1.2)
Bilirubin, Direct: 0.15 mg/dL (ref 0.00–0.40)
Total Protein: 6.5 g/dL (ref 6.0–8.5)

## 2018-07-13 LAB — LIPID PANEL
Chol/HDL Ratio: 2.9 ratio (ref 0.0–4.4)
Cholesterol, Total: 143 mg/dL (ref 100–199)
HDL: 49 mg/dL (ref >39)
LDL Calculated: 74 mg/dL (ref 0–99)
Triglycerides: 98 mg/dL (ref 0–149)
VLDL Cholesterol Cal: 20 mg/dL (ref 5–40)

## 2018-07-17 ENCOUNTER — Other Ambulatory Visit: Payer: Self-pay

## 2018-07-17 ENCOUNTER — Ambulatory Visit: Payer: Medicare Other | Admitting: Cardiovascular Disease

## 2018-07-17 DIAGNOSIS — I2510 Atherosclerotic heart disease of native coronary artery without angina pectoris: Secondary | ICD-10-CM

## 2018-07-17 MED ORDER — ATORVASTATIN CALCIUM 40 MG PO TABS: 20.0000 mg | ORAL_TABLET | Freq: Every day | ORAL | 2 refills | Status: DC

## 2018-08-16 ENCOUNTER — Ambulatory Visit (INDEPENDENT_AMBULATORY_CARE_PROVIDER_SITE_OTHER): Payer: Medicare Other | Admitting: Internal Medicine

## 2018-08-16 ENCOUNTER — Other Ambulatory Visit: Payer: Self-pay

## 2018-08-16 ENCOUNTER — Ambulatory Visit (HOSPITAL_COMMUNITY)
Admission: RE | Admit: 2018-08-16 | Discharge: 2018-08-16 | Disposition: A | Discharge status: Home / Self Care | Payer: Medicare Other | Source: Ambulatory Visit | Attending: Oncology | Admitting: Oncology

## 2018-08-16 VITALS — BP 147/76 | HR 61 | Temp 97.9°F | Wt 160.3 lb

## 2018-08-16 DIAGNOSIS — M545 Low back pain, unspecified: Secondary | ICD-10-CM

## 2018-08-16 DIAGNOSIS — G8929 Other chronic pain: Secondary | ICD-10-CM | POA: Diagnosis not present

## 2018-08-16 DIAGNOSIS — R109 Unspecified abdominal pain: Secondary | ICD-10-CM | POA: Diagnosis not present

## 2018-08-16 DIAGNOSIS — M25552 Pain in left hip: Secondary | ICD-10-CM

## 2018-08-16 LAB — POCT URINALYSIS DIPSTICK
Bilirubin, UA: NEGATIVE
Blood, UA: NEGATIVE
Glucose, UA: NEGATIVE
Ketones, UA: NEGATIVE
Leukocytes, UA: NEGATIVE
Nitrite, UA: NEGATIVE
Protein, UA: NEGATIVE
Spec Grav, UA: 1.025 (ref 1.010–1.025)
Urobilinogen, UA: 0.2 E.U./dL
pH, UA: 6 (ref 5.0–8.0)

## 2018-08-16 NOTE — Patient Instructions (Signed)
Ms. Patsey Berthold,   I made a referral to the orthopedic surgeon for further evaluation of your hip pain. You should receive a call from them within a week to set up an appointment. Call us if you have any questions or concerns.  -Dr. Frederico Hamman

## 2018-08-17 LAB — URINALYSIS, ROUTINE W REFLEX MICROSCOPIC
Bilirubin, UA: NEGATIVE
Glucose, UA: NEGATIVE
Ketones, UA: NEGATIVE
Leukocytes, UA: NEGATIVE
Nitrite, UA: NEGATIVE
Protein, UA: NEGATIVE
RBC, UA: NEGATIVE
Specific Gravity, UA: 1.015 (ref 1.005–1.030)
Urobilinogen, Ur: 0.2 mg/dL (ref 0.2–1.0)
pH, UA: 5 (ref 5.0–7.5)

## 2018-08-18 ENCOUNTER — Encounter: Payer: Self-pay | Admitting: Internal Medicine

## 2018-08-18 NOTE — Assessment & Plan Note (Signed)
Ms. Kurkowski presents with a 1-week history of L flank pain and presents for further evaluation due to concern for a kidney infection. She points to her L lower back when she points to the location of the pain. The pain worsens with movement, is dull and constant. She has a history of chronic L hip pain from presumed OA but states this pain feels different. Denies recent falls or trauma. She denies urinary symptoms including dysuria, hematuria, urgency, and frequency. She also denies fever and chills. On exam, there is no CVA tenderness but there is tenderness to palpation of left lumbar paraspinal muscles. Urine dipstick and UA unremarkable. I suspect her pain is secondary to muscle spasm or strain as well as from L hip pain.  Very low suspicion for UTI or pyelonephritis in the setting of normal UA. She declined muscle relaxant for her back pain.  Will order plain films of left hip as she has never had imaging done, and referred to orthopedic surgery for further evaluation of L hip pain.

## 2018-08-18 NOTE — Progress Notes (Signed)
   CC: L flank pain   HPI:  [name] is a 73 y.o. year-old female with PMH listed below who presents to clinic for L flank pain. Please see problem based assessment and plan for further details.   Past Medical History:  Diagnosis Date  . APPENDECTOMY, HX OF 07/10/2006   Qualifier: Diagnosis of  By: Oretha Ellis    . CAD (coronary artery disease) 2007   nonobstructive, 30% LAD 02/2006  . COPD (chronic obstructive pulmonary disease) ([place])   . 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 GERD (gastroesophageal reflux disease)   . Hemorrhoids   . HLD (hyperlipidemia)   . Hypertension   . HYSTERECTOMY, HX OF 07/10/2006   Qualifier: Diagnosis of  By: Oretha Ellis    . Myocardial infarct, old    Review of Systems:   Review of Systems  Constitutional: Negative for chills, fever and malaise/fatigue.  Genitourinary: Positive for flank pain. Negative for dysuria, frequency, hematuria and urgency.  Musculoskeletal: Positive for falls, joint pain and myalgias.    Physical Exam: Vitals:   08/16/18 0922  BP: (!) 147/76  Pulse: 61  Temp: 97.9 F (36.6 C)  TempSrc: Oral  SpO2: 98%  Weight: 160 lb 4.8 oz (72.7 kg)   General: Well appearing female in no acute distress Back: No L CVA tenderness. There is tenderness to palpation over L lumbar paraspinal muscles  MSK: patient declined examination of L hip due to pain    Assessment & Plan:   See Encounters Tab for problem based charting.  Patient discussed with Dr. Beryle Beams

## 2018-08-19 NOTE — Progress Notes (Signed)
Medicine attending: Medical history, presenting problems, physical findings, and medications, reviewed with resident physician Dr Santos-Sanchez on the day of the patient visit and I concur with her evaluation and management plan. 

## 2018-08-27 DIAGNOSIS — M545 Low back pain: Secondary | ICD-10-CM | POA: Diagnosis not present

## 2018-08-29 ENCOUNTER — Other Ambulatory Visit: Payer: Self-pay

## 2018-08-29 ENCOUNTER — Other Ambulatory Visit: Payer: Self-pay | Admitting: Internal Medicine

## 2018-08-29 MED ORDER — OMEPRAZOLE 40 MG PO CPDR: 40.0000 mg | DELAYED_RELEASE_CAPSULE | Freq: Two times a day (BID) | ORAL | 1 refills | Status: DC

## 2018-08-29 NOTE — Telephone Encounter (Signed)
omeprazole (PRILOSEC) 40 MG capsule, refill request @  [place], [place] [phone] (Phone) [phone] (Fax)

## 2018-08-30 ENCOUNTER — Other Ambulatory Visit: Payer: Self-pay

## 2018-08-30 NOTE — Telephone Encounter (Signed)
Rx refused.  Deferred request to patient's PCP.

## 2018-09-03 ENCOUNTER — Encounter: Payer: Medicare Other | Admitting: Internal Medicine

## 2018-09-22 DIAGNOSIS — M5416 Radiculopathy, lumbar region: Secondary | ICD-10-CM | POA: Diagnosis not present

## 2018-09-25 DIAGNOSIS — G8314 Monoplegia of lower limb affecting left nondominant side: Secondary | ICD-10-CM | POA: Diagnosis not present

## 2018-09-25 DIAGNOSIS — M48061 Spinal stenosis, lumbar region without neurogenic claudication: Secondary | ICD-10-CM | POA: Diagnosis not present

## 2018-10-04 ENCOUNTER — Other Ambulatory Visit: Payer: Self-pay | Admitting: Cardiovascular Disease

## 2018-10-04 NOTE — Telephone Encounter (Signed)
Carvedilol 25 mg refilled. 

## 2018-10-15 ENCOUNTER — Ambulatory Visit (INDEPENDENT_AMBULATORY_CARE_PROVIDER_SITE_OTHER): Payer: Medicare Other | Admitting: Internal Medicine

## 2018-10-15 ENCOUNTER — Encounter: Payer: Self-pay | Admitting: Internal Medicine

## 2018-10-15 ENCOUNTER — Other Ambulatory Visit: Payer: Self-pay

## 2018-10-15 DIAGNOSIS — M545 Low back pain, unspecified: Secondary | ICD-10-CM

## 2018-10-15 DIAGNOSIS — G8929 Other chronic pain: Secondary | ICD-10-CM

## 2018-10-15 DIAGNOSIS — I10 Essential (primary) hypertension: Secondary | ICD-10-CM | POA: Diagnosis not present

## 2018-10-15 NOTE — Progress Notes (Signed)
  [facility] Internal [facility] Residency Telephone Encounter Continuity Care Appointment  HPI:   This telephone encounter was created for [name] on 10/15/2018 for the following purpose/cc HTN, chronic hip, back pain   Past Medical History:  Past Medical History:  Diagnosis Date  . APPENDECTOMY, HX OF 07/10/2006   Qualifier: Diagnosis of  By: Oretha Ellis    . CAD (coronary artery disease) 2007   nonobstructive, 30% LAD 02/2006  . COPD (chronic obstructive pulmonary disease) ([place])   . 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 GERD (gastroesophageal reflux disease)   . Hemorrhoids   . HLD (hyperlipidemia)   . Hypertension   . HYSTERECTOMY, HX OF 07/10/2006   Qualifier: Diagnosis of  By: Oretha Ellis    . Myocardial infarct, old       ROS:   Denies fevers, chills, syncope, chest pain, palpitations, shortness of breath, cough, URI, lower extremity edema    Assessment / Plan / Recommendations:   Please see A&P under problem oriented charting for assessment of the patient's acute and chronic medical conditions.   As always, pt is advised that if symptoms worsen or new symptoms arise, they should go to an urgent care facility or to to ER for further evaluation.   Consent and Medical Decision Making:   Patient discussed with Dr. Dareen Piano  This is a telephone encounter between Alexis Kline and Delice Bison on 10/15/2018 for HTN, chronic hip and back pain. The visit was conducted with the patient located at home and Delice Bison at [facility]. The patient's identity was confirmed using their DOB and current address. The patient has consented to being evaluated through a telephone encounter and understands the associated risks (an examination cannot be done and the patient may need to come in for an appointment) / benefits (allows the patient to remain at home, decreasing exposure to coronavirus). I personally spent 15 minutes on  medical discussion.

## 2018-10-15 NOTE — Assessment & Plan Note (Signed)
Alexis Kline continues to have issues with her chronic back pain. Pain has not worsened. No symptoms of fevers, chills, numbness/tingling or weakness in lower extremities. Described as constant throbbing that is relieved somewhat with warm rice packs and NSAIDs. She was referred to orthopedics at last visit on 2/29. Appointment is not until 6/17. In the mean time, encouraged her to continue using NSAIDs and heat as needed.

## 2018-10-15 NOTE — Assessment & Plan Note (Signed)
Patient is compliant with current regimen without adverse side effects. Checks blood pressure consistently at home ranging from 100-140/60-90. She denies any symptoms of orthostatic hypotension.  Continue Amlodipine and HCTZ

## 2018-10-16 NOTE — Progress Notes (Signed)
Internal Medicine Clinic Attending  Case discussed with Dr. Bloomfield at the time of the visit.  We reviewed the resident's history and exam and pertinent patient test results.  I agree with the assessment, diagnosis, and plan of care documented in the resident's note.  

## 2018-11-04 ENCOUNTER — Other Ambulatory Visit: Payer: Self-pay | Admitting: Adult Health

## 2018-11-04 DIAGNOSIS — I10 Essential (primary) hypertension: Secondary | ICD-10-CM

## 2018-11-19 ENCOUNTER — Telehealth: Payer: Self-pay | Admitting: Cardiovascular Disease

## 2018-11-19 NOTE — Telephone Encounter (Signed)
Video visit/my chart/pre reg complete/consent obtained -- ttf

## 2018-11-23 ENCOUNTER — Encounter: Payer: Self-pay | Admitting: Cardiovascular Disease

## 2018-11-23 ENCOUNTER — Telehealth (INDEPENDENT_AMBULATORY_CARE_PROVIDER_SITE_OTHER): Payer: Medicare Other | Admitting: Cardiovascular Disease

## 2018-11-23 VITALS — BP 137/97 | HR 60 | Ht 62.0 in | Wt 156.7 lb

## 2018-11-23 DIAGNOSIS — I2510 Atherosclerotic heart disease of native coronary artery without angina pectoris: Secondary | ICD-10-CM

## 2018-11-23 DIAGNOSIS — E782 Mixed hyperlipidemia: Secondary | ICD-10-CM

## 2018-11-23 DIAGNOSIS — I10 Essential (primary) hypertension: Secondary | ICD-10-CM | POA: Diagnosis not present

## 2018-11-23 DIAGNOSIS — M25473 Effusion, unspecified ankle: Secondary | ICD-10-CM | POA: Diagnosis not present

## 2018-11-23 DIAGNOSIS — K219 Gastro-esophageal reflux disease without esophagitis: Secondary | ICD-10-CM | POA: Diagnosis not present

## 2018-11-23 NOTE — Progress Notes (Signed)
Virtual Visit via Video Note   This visit type was conducted due to national recommendations for restrictions regarding the COVID-19 Pandemic (e.g. social distancing) in an effort to limit this patient's exposure and mitigate transmission in our community.  Due to her co-morbid illnesses, this patient is at least at moderate risk for complications without adequate follow up.  This format is felt to be most appropriate for this patient at this time.  All issues noted in this document were discussed and addressed.  A limited physical exam was performed with this format.  Please refer to the patient's chart for her consent to telehealth for [facility].   Date:  11/23/2018   ID:  Alexis Kline, DOB [birth year], MRN [id number]  Patient Location: Home Provider Location: Home  PCP:  Delice Bison, DO  Cardiologist:  Shelva Majestic, MD Electrophysiologist:  None   Evaluation Performed:  Follow-Up Visit  Chief Complaint: Hypertension, mild swelling and rare palpitations  History of Present Illness:    Alexis Kline is a 73 y.o. female who has a greater than 30 year history of hypertension, a history of hyperlipidemia, as well as GERD.  She also has a history of osteopenia.  She developed a bandlike chest pressure around her upper chest without radiation that occurred at rest and lasted for 15-20 minutes with spontaneous resolution.  Her symptoms were also associated with shortness of breath and she felt that she could not catch her breath.  She denies any classic exertional symptomatology.  Reportedly also has a history of chronic stable angina.  She underwent cardiac catheterization in 2012 and was found to have 30% areas of segmental disease in her LAD.  Smoking cessation was advised.  She was treated medically.  She has a history of bilateral neck pain radiating to her shoulders and has chronic cervical of musculoskeletal discomfort from traumatic injury over 20 years ago.  She  was  evaluated by Dr. Quay Burow and with  chest pain symptomatology she was now referred for cardiology evaluation.  Of note, the patient states she drinks at least 8 cups of coffee per day.  She admits to occasional palpitations.  When I initially saw her, we discussed significant reduction in caffeine intake.  Her blood pressure was upper normal in width.  Her chest pain.  I recommended titration of carvedilol to 18.75 mg twice a day.  She underwent an echo Doppler study on 11/09/2016 which showed hyperdynamic LV function with an EF of 65-70%.  There was grade 1 diastolic dysfunction.  There was mild mitral annular calcification.  A nuclear perfusion study showed an EF of 81%.  There were no ECG changes during stress.  She had normal perfusion.  Laboratory showed a hemoglobin of 12.7 and hematocrit of 40.8.  TSH was normal at 1.49.   I  saw her in November 2018.  At that time she was under significant stress to her daughter who is 70 years old had just been diagnosed with stage IV lung cancer on April 19, 2017.  Unfortunately, her daughter died on 06/11/17.  I last saw her in September 2019 at which time she had noticed recent significant blood pressure lability.  She has not had recent laboratory.  She denies any episodes of chest tightness or significant shortness of breath.  She has been on hydrochlorothiazide 25 mg and carvedilol 25 mg twice a day for blood pressure.  She was on atorvastatin 40 mg daily and has GERD on omeprazole.  During that evaluation, her blood pressure was significantly elevated at 201/103.  Her daughter had recently passed away.  I added amlodipine 5 mg initially to her medical regimen.  She was seen on May 07, 2018 and follow-up by Hershal Coria and blood pressure was significantly improved at 118/68.  Recently, her blood pressure has continued to be fairly well controlled but at times may still increase slightly.  She had noticed some occasional chest  fluttering's of short duration.  She has reduced her caffeine intake.  She admits to mild swelling around her right ankle.  She denies chest pain PND orthopnea she denies exertional dyspnea.  She presents for reevaluation.  The patient does not have symptoms concerning for COVID-19 infection (fever, chills, cough, or new shortness of breath).    Past Medical History:  Diagnosis Date  . APPENDECTOMY, HX OF 07/10/2006   Qualifier: Diagnosis of  By: Oretha Ellis    . CAD (coronary artery disease) 2007   nonobstructive, 30% LAD 02/2006  . COPD (chronic obstructive pulmonary disease) ([place])   . 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 GERD (gastroesophageal reflux disease)   . Hemorrhoids   . HLD (hyperlipidemia)   . Hypertension   . HYSTERECTOMY, HX OF 07/10/2006   Qualifier: Diagnosis of  By: Oretha Ellis    . Myocardial infarct, old    Past Surgical History:  Procedure Laterality Date  . ABDOMINAL HYSTERECTOMY  1988  . APPENDECTOMY  1966  . BLADDER SUSPENSION  1997  . BREAST EXCISIONAL BIOPSY Right 1988  . BREAST LUMPECTOMY     right breat  . [place]   3 operations in right hand and 2 operations in left hand. Dr. Redmond Pulling in [place]  . PTCA  2007  . rectal stapling  2008   Done by Dr. Marlou Starks  . RECTAL SURGERY  01/07/2007   Dr. Marlou Starks in [place].   Marland Kitchen SHOULDER SURGERY  1991   Right shoulder surgery in 1991 and left shoulder surgery in 1992 by Dr. Redmond Pulling in [place].     Current Meds  Medication Sig  . amLODipine (NORVASC) 5 MG tablet Take 1 tablet (5 mg total) by mouth daily.  Marland Kitchen aspirin 81 MG EC tablet Take 1 tablet (81 mg total) by mouth daily.  . ASPIRIN-ACETAMINOPHEN-CAFFEINE PO Take by mouth as needed.  Marland Kitchen atorvastatin (LIPITOR) 40 MG tablet Take 0.5 tablets (20 mg total) by mouth daily.  . B Complex-Biotin-FA (SUPER B-COMPLEX PO) Take 1 tablet by mouth daily.    . calcium-vitamin D (OSCAL 500/200 D-3) 500-200 MG-UNIT  tablet Take 2 tablets by mouth daily with breakfast.  . carvedilol (COREG) 25 MG tablet TAKE 1 TABLET BY MOUTH TWICE DAILY WITH A MEAL.  . hydrochlorothiazide (HYDRODIURIL) 25 MG tablet TAKE 1 TABLET ONCE DAILY.  . nitroGLYCERIN (NITROSTAT) 0.4 MG SL tablet Place 1 tablet (0.4 mg total) under the tongue every 5 (five) minutes. up to 3 times as needed for chest pain  . omeprazole (PRILOSEC) 40 MG capsule Take 1 capsule (40 mg total) by mouth 2 (two) times daily. TAKE (1) CAPSULE DAILY.  Marland Kitchen senna-docusate (SENNALAX-S) 8.6-50 MG tablet Take 2 tablets by mouth daily as needed (for constipation).     Allergies:   [name] isothiocyanate]; Iodine; Other; and Tape   Social History   Tobacco Use  . Smoking status: Former Smoker    Years: 54.00    Types: Cigarettes  Last attempt to quit: 06/20/2013    Years since quitting: 5.4  . Smokeless tobacco: Never Used  Substance Use Topics  . Alcohol use: Yes    Alcohol/week: 0.0 standard drinks    Comment: rarely "maybe a drink of New Year's"  . 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  She is widowed for 20 years.  She had 2 children, 4 grandchildren, and 11 great grandchildren.  She lives by herself.  She has 2 years of college.  She retired in 1970.  She previously had smoked for 55 years.  She does walk approximately 3-4 days per week.   Family Hx: The patient's family history includes Heart disease in her sister; Hyperlipidemia in her father and mother; Hypertension in her father and mother. There is no history of Stroke, Diabetes, or Colon cancer.  ROS:   Please see the history of present illness.    No fevers chills night sweats No cough, change in smell or taste No shortness of breath or wheezing Rare intermittent palpitations improved with caffeine reduction No chest tightness Positive for GERD Positive for intermittent right ankle swelling No bleeding Positive for chronic neck  and arm pain No rashes All other systems reviewed and are negative.   Prior CV studies:   The following studies were reviewed today:  ------------------------------------------------------------------- ECHO 10/20/2016 Study Conclusions  - Left ventricle: The cavity size was normal. Systolic function was   vigorous. The estimated ejection fraction was in the range of 65%   to 70%. Wall motion was normal; there were no regional wall   motion abnormalities. Doppler parameters are consistent with   abnormal left ventricular relaxation (grade 1 diastolic   dysfunction). - Mitral valve: Calcified annulus  Labs/Other Tests and Data Reviewed:    EKG:  An ECG dated 03/14/2018 was personally reviewed today and demonstrated:  Normal sinus rhythm at 62 bpm.  Poor anterior R wave progression.  Normal intervals.  No ectopy.  Recent Labs: 03/28/2018: Hemoglobin 13.1; Platelets 269; TSH 0.907 07/13/2018: ALT 32; BUN 13; Creatinine, Ser 0.73; Potassium 3.8; Sodium 142   Recent Lipid Panel Lab Results  Component Value Date/Time   CHOL 143 07/13/2018 09:20 AM   TRIG 98 07/13/2018 09:20 AM   HDL 49 07/13/2018 09:20 AM   CHOLHDL 2.9 07/13/2018 09:20 AM   CHOLHDL 3.8 01/01/2014 02:33 PM   LDLCALC 74 07/13/2018 09:20 AM    Wt Readings from Last 3 Encounters:  11/23/18 156 lb 11.2 oz (71.1 kg)  08/16/18 160 lb 4.8 oz (72.7 kg)  05/07/18 160 lb 6.4 oz (72.8 kg)     Objective:    Vital Signs:  BP (!) 137/97   Pulse 60   Ht 5\' 2"  (1.575 m)   Wt 156 lb 11.2 oz (71.1 kg)   LMP 06/20/1986   BMI 28.66 kg/m    This was an initial video visit which had to be transition to a phone visit due to technical difficulties  She states her blood pressure at home typically runs from 110-140/60-90  There is no significant change in physical exam from her prior office visit.   HEENT is unchanged Breathing is normal and not labored There is no audible wheezing She did not have chest wall tenderness to  palpation Presently her heart rhythm was regular with no irregularity There was no discomfort to her abdomen.  She admits to some mild swelling around her right foot near  her ankle No neurologic symptoms She has a normal affect and mood  ASSESSMENT & PLAN:    1. CAD: Mild nonobstructive CAD noted at cardiac catheterization in May 2012.  No recent anginal symptoms last nuclear perfusion study May 2018 showed normal perfusion with hyperdynamic EF and normal wall motion. 2. Essential hypertension: When I last saw her she had significant blood pressure elevation at 201/103 initially which improved to 178/90.  During that evaluation I added amlodipine 5 mg to her medical regimen.  She is now on amlodipine 5 mg, carvedilol 25 mg twice a day, and HCTZ 25 mg.  She will need to continue to monitor her blood pressure.  I discussed with her stage I hypertension begins at 130/80.  When she was seen in follow-up with Jory Sims several months after evaluation her blood pressure was excellent at 322 systolically.  I again have discussed the importance of sodium restriction.  She has noted some intermittent right foot/ankle swelling.  I have suggested the addition of support stocking for benefit. 3. Hyperlipidemia: Currently on atorvastatin 40 mg.  Most recent laboratory LDL 74.  Triglycerides have improved and are now normal. 4. Intermittent palpitations: Improved with discontinuance of caffeine.  Currently on carvedilol 25 mg twice a day 5. GERD: Controlled with omeprazole  COVID-19 Education: The signs and symptoms of COVID-19 were discussed with the patient and how to seek care for testing (follow up with PCP or arrange E-visit).  The importance of social distancing was discussed today.  Time:   Today, I have spent 25 minutes with the patient with telehealth technology discussing the above problems.     Medication Adjustments/Labs and Tests Ordered: Current medicines are reviewed at length with the  patient today.  Concerns regarding medicines are outlined above.   Tests Ordered: No orders of the defined types were placed in this encounter.   Medication Changes: No orders of the defined types were placed in this encounter.   Disposition:  Follow up 6 months  Signed, Shelva Majestic, MD  11/23/2018 1:34 PM    [facility]

## 2018-11-23 NOTE — Patient Instructions (Addendum)

## 2018-12-05 ENCOUNTER — Other Ambulatory Visit: Payer: Self-pay

## 2018-12-05 ENCOUNTER — Encounter (INDEPENDENT_AMBULATORY_CARE_PROVIDER_SITE_OTHER): Payer: Medicare Other | Admitting: Neurology

## 2018-12-05 ENCOUNTER — Ambulatory Visit (INDEPENDENT_AMBULATORY_CARE_PROVIDER_SITE_OTHER): Payer: Medicare Other | Admitting: Neurology

## 2018-12-05 DIAGNOSIS — M545 Low back pain, unspecified: Secondary | ICD-10-CM

## 2018-12-05 DIAGNOSIS — Z0289 Encounter for other administrative examinations: Secondary | ICD-10-CM

## 2018-12-05 NOTE — Procedures (Signed)
Full Name: Alexis Kline Gender: Female MRN #: [id number] Date of Birth: [birth year]    Visit Date: 12/05/2018 10:54 Age: 73 Years 67 Months Old Examining Physician: Marcial Pacas, MD  Referring Physician: Melina Schools, MD History: 73 years old female, presented with chronic low back pain, radiating pain to bilateral lower extremity, bilateral feet paresthesia.  Summary of the tests: Nerve conduction study: Bilateral sural, superficial peroneal sensory responses were absent.  Bilateral tibial, peroneal to EDB motor responses showed severely decreased the C map amplitude, with mildly slow conduction velocity.  Left ulnar sensory response showed mildly prolonged peak latency with mildly decreased to snap amplitude.  Left ulnar motor response was normal  Electromyography: Selected needle examinations were performed at bilateral lower extremity muscles and bilateral lumbosacral paraspinal muscles.  There was no significant abnormality noted.  Conclusion: This is an abnormal study.  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  There is no evidence of bilateral lumbosacral radiculopathy.    ------------------------------- Marcial Pacas, M.D. PhD  [facility] [place], [place] [address] Tel: [phone] Fax: [phone]        [facility]    Nerve / Sites Muscle Latency Ref. Amplitude Ref. Rel Amp Segments Distance Velocity Ref. Area    ms ms mV mV %  cm m/s m/s mVms  L Ulnar - ADM     Wrist ADM 2.8 ?3.3 6.9 ?6.0 100 Wrist - ADM 7   24.8     B.Elbow ADM 5.9  6.0  87.3 B.Elbow - Wrist 15 49 ?49 22.6     A.Elbow ADM 7.9  6.1  102 A.Elbow - B.Elbow 10 49 ?49 22.0         A.Elbow - Wrist      L Peroneal - EDB     Ankle EDB 4.8 ?6.5 1.2 ?2.0 100 Ankle - EDB 9   3.7     Fib head EDB 11.0  1.1  89 Fib head - Ankle 25 40 ?44 3.9     Pop fossa EDB 13.2  1.2  111 Pop fossa - Fib head 8 37 ?44 4.9         Pop fossa - Ankle       R Peroneal - EDB     Ankle EDB 5.7 ?6.5 0.5 ?2.0 100 Ankle - EDB 9   1.1     Fib head EDB 12.9  0.5  103 Fib head - Ankle 26 36 ?44 1.1     Pop fossa EDB 15.3  0.5  93.9 Pop fossa - Fib head 8 34 ?44 1.0         Pop fossa - Ankle      L Tibial - AH     Ankle AH 4.7 ?5.8 1.2 ?4.0 100 Ankle - AH 9   4.2     Pop fossa AH 15.7  1.0  80 Pop fossa - Ankle 33 30 ?41 4.1  R Tibial - AH     Ankle AH 5.0 ?5.8 0.5 ?4.0 100 Ankle - AH 9   1.0     Pop fossa AH 15.3  0.1  19.4 Pop fossa - Ankle 34 33 ?41 0.8               SNC    Nerve / Sites Rec. Site Peak Lat Ref.  Amp Ref. Segments Distance    ms ms V V  cm  L Sural - Ankle (Calf)  Calf Ankle NR ?4.4 NR ?6 Calf - Ankle 14  R Sural - Ankle (Calf)     Calf Ankle NR ?4.4 NR ?6 Calf - Ankle 14  L Superficial peroneal - Ankle     Lat leg Ankle NR ?4.4 NR ?6 Lat leg - Ankle 14  R Superficial peroneal - Ankle     Lat leg Ankle NR ?4.4 NR ?6 Lat leg - Ankle 14  L Ulnar - Orthodromic, (Dig V, Mid palm)     Dig V Wrist 3.5 ?3.1 3 ?5 Dig V - Wrist 4               F  Wave    Nerve F Lat Ref.   ms ms  L Tibial - AH 74.0 ?56.0  R Tibial - AH 59.1 ?56.0  L Ulnar - ADM 28.8 ?32.0           H Reflex    Nerve H Lat   ms   Left Right Ref.  Tibial - [place] 42.8 35.5 ?35.0         EMG       EMG Summary Table    Spontaneous MUAP Recruitment  Muscle IA Fib PSW Fasc Other Amp Dur. Poly Pattern  L. Tibialis anterior Normal None None None _______ Normal Normal Normal Normal  L. Tibialis posterior Normal None None None _______ Normal Normal Normal Normal  L. Gastrocnemius (Medial head) Normal None None None _______ Normal Normal Normal Normal  L. Vastus lateralis Normal None None None _______ Normal Normal Normal Normal  L. Biceps femoris (short head) Normal None None None _______ Normal Normal Normal Normal  L. Biceps femoris (long head) Normal None None None _______ Normal Normal Normal Normal  L. Lumbar paraspinals (mid) Normal None  None None _______ Normal Normal Normal Normal  L. Lumbar paraspinals (low) Normal None None None _______ Normal Normal Normal Normal  R. Tibialis anterior Normal None None None _______ Normal Normal Normal Normal  R. Tibialis posterior Normal None None None _______ Normal Normal Normal Normal  R. Gastrocnemius (Medial head) Normal None None None _______ Normal Normal Normal Normal  R. Vastus lateralis Normal None None None _______ Normal Normal Normal Normal  R. Biceps femoris (long head) Normal None None None _______ Normal Normal Normal Normal  R. Lumbar paraspinals (low) Normal None None None _______ Normal Normal Normal Normal  R. Lumbar paraspinals (mid) Normal None None None _______ Normal Normal Normal Normal  L. Lumbar paraspinals Normal None None None _______ Normal Normal Normal Normal

## 2019-01-02 ENCOUNTER — Other Ambulatory Visit: Payer: Self-pay | Admitting: Cardiovascular Disease

## 2019-01-02 DIAGNOSIS — I208 Other forms of angina pectoris: Secondary | ICD-10-CM

## 2019-02-14 ENCOUNTER — Telehealth: Payer: Self-pay | Admitting: *Deleted

## 2019-02-14 ENCOUNTER — Telehealth: Payer: Self-pay

## 2019-02-14 NOTE — Telephone Encounter (Signed)
Pt was called to remind her of appt, pt states she is very dizzy, weak, feels strange. Her speech has a slur, she denies, N&V, chest pain but does state she has been having "lots of flutters", no increased shortness of breath, denies numbness or change in gait except she states she is staggering. States her blood pressure keeps going real high and then real low, she didn't answer how she knew this. She is advised to call 911 and go to ED for eval of possible stroke,  she has no transportation other than bus service. She is hesitant but agreeable.

## 2019-02-14 NOTE — Telephone Encounter (Signed)
Agree with ED evaluation.  Thank you.

## 2019-02-14 NOTE — Telephone Encounter (Signed)
Requesting to speak with a nurse about something. Please call pt back.  °

## 2019-02-14 NOTE — Telephone Encounter (Signed)
Rtc, pt states she called ems and her bp was elevated, they did note some slight changes and her unsteadiness but that they didn't think she needed to come to ED for eval. She requests an appt w/ pcp for at least a month away. sch 10/5 at 1515

## 2019-02-18 ENCOUNTER — Encounter: Payer: Medicare Other | Admitting: Internal Medicine

## 2019-03-05 ENCOUNTER — Other Ambulatory Visit: Payer: Self-pay | Admitting: Cardiovascular Disease

## 2019-03-05 ENCOUNTER — Other Ambulatory Visit: Payer: Self-pay | Admitting: Internal Medicine

## 2019-03-25 ENCOUNTER — Other Ambulatory Visit: Payer: Self-pay

## 2019-03-25 ENCOUNTER — Encounter: Payer: Self-pay | Admitting: Internal Medicine

## 2019-03-25 ENCOUNTER — Ambulatory Visit (INDEPENDENT_AMBULATORY_CARE_PROVIDER_SITE_OTHER): Payer: Medicare Other | Admitting: Internal Medicine

## 2019-03-25 VITALS — BP 142/73 | HR 65 | Wt 157.8 lb

## 2019-03-25 DIAGNOSIS — M549 Dorsalgia, unspecified: Secondary | ICD-10-CM

## 2019-03-25 DIAGNOSIS — G894 Chronic pain syndrome: Secondary | ICD-10-CM | POA: Diagnosis not present

## 2019-03-25 DIAGNOSIS — Z79899 Other long term (current) drug therapy: Secondary | ICD-10-CM | POA: Diagnosis not present

## 2019-03-25 DIAGNOSIS — Z23 Encounter for immunization: Secondary | ICD-10-CM

## 2019-03-25 DIAGNOSIS — I10 Essential (primary) hypertension: Secondary | ICD-10-CM | POA: Diagnosis not present

## 2019-03-25 DIAGNOSIS — Z1231 Encounter for screening mammogram for malignant neoplasm of breast: Secondary | ICD-10-CM

## 2019-03-25 NOTE — Patient Instructions (Signed)
Ms. [place], It was nice seeing you today.   Glad to hear you are doing fairly well overall. We will not make any medication changes. The one thing I can suggest is dividing up your blood pressure medications between morning and night (HCTZ and coreg in the morning, amlodipine and second dose of Coreg at night) to see if that helps avoid low blood pressure.   I have placed a referral for mammogram screening.   We will check a lab to make sure your kidney function and electrolytes are normal.   I'll plan to see you back in about 6 months, but please call sooner if you need anything.   Take care, Dr. Koleen Distance

## 2019-03-26 ENCOUNTER — Telehealth: Payer: Self-pay | Admitting: Internal Medicine

## 2019-03-26 ENCOUNTER — Encounter: Payer: Self-pay | Admitting: Internal Medicine

## 2019-03-26 DIAGNOSIS — Z1231 Encounter for screening mammogram for malignant neoplasm of breast: Secondary | ICD-10-CM | POA: Insufficient documentation

## 2019-03-26 LAB — BMP8+ANION GAP
Anion Gap: 14 mmol/L (ref 10.0–18.0)
BUN/Creatinine Ratio: 19 (ref 12–28)
BUN: 15 mg/dL (ref 8–27)
CO2: 24 mmol/L (ref 20–29)
Calcium: 9.7 mg/dL (ref 8.7–10.3)
Chloride: 103 mmol/L (ref 96–106)
Creatinine, Ser: 0.8 mg/dL (ref 0.57–1.00)
GFR calc Af Amer: 85 mL/min/{1.73_m2} (ref >59)
GFR calc non Af Amer: 74 mL/min/{1.73_m2} (ref >59)
Glucose: 83 mg/dL (ref 65–99)
Potassium: 3.5 mmol/L (ref 3.5–5.2)
Sodium: 141 mmol/L (ref 134–144)

## 2019-03-26 NOTE — Assessment & Plan Note (Addendum)
Patient endorses fluctuations in blood pressure at home from [id number] systolic. She is symptomatic with both high and low blood pressures. Advised her to try taking the HCTZ and first dose of Coreg in the morning and amlodipine and second dose of Coreg in the evening before bed to see if this helps reduce symptomatic hypotension. Unable to decrease regimen due to risks associated with uncontrolled HTN. Renal function and electrolytes stable on BMP today.

## 2019-03-26 NOTE — Telephone Encounter (Signed)
Pt requesting nurse to callback [phone]

## 2019-03-26 NOTE — Progress Notes (Signed)
   CC: HTN   HPI:  Alexis Kline is a 73 y.o. female with PMHx listed below who presents for follow-up on chronic HTN, need for mammogram screening and influenza vaccine. Please see problem based charting for further details of today's visit.   Past Medical History:  Diagnosis Date  . APPENDECTOMY, HX OF 07/10/2006   Qualifier: Diagnosis of  By: Oretha Ellis    . CAD (coronary artery disease) 2007   nonobstructive, 30% LAD 02/2006  . COPD (chronic obstructive pulmonary disease) ([place])   . 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 GERD (gastroesophageal reflux disease)   . Hemorrhoids   . HLD (hyperlipidemia)   . Hypertension   . HYSTERECTOMY, HX OF 07/10/2006   Qualifier: Diagnosis of  By: Oretha Ellis    . Myocardial infarct, old    Review of Systems:  Review of Systems  Constitutional: Negative for chills, fever and weight loss.  Eyes: Negative for blurred vision.  Respiratory: Negative for shortness of breath.   Cardiovascular: Negative for chest pain, palpitations and leg swelling.  Gastrointestinal: Negative for abdominal pain, constipation and diarrhea.  Genitourinary: Negative for dysuria.  Musculoskeletal: Positive for back pain. Negative for falls.  Neurological: Negative for sensory change, weakness and headaches.    Physical Exam:  Vitals:   03/25/19 1525  BP: (!) 142/73  Pulse: 65  SpO2: 99%  Weight: 157 lb 12.8 oz (71.6 kg)   General: well-appearing female in NAD HEENT: conjunctiva normal Neck: supple, no thyromegaly or LAD CV: RRR; no m/r/g Pulm: normal work of breathing; lungs CTAB Abd: BS+; abdomen soft, non-tender, non-distended Ext: no edema MSK: mild TTP of bilateral paraspinal muscles in thoracic region  Psych: appropriate mood and affect   Assessment & Plan:   See Encounters Tab for problem based charting.  Patient discussed with Dr. Daryll Drown

## 2019-03-26 NOTE — Assessment & Plan Note (Signed)
Referral placed for mammogram screening, as patient has not had one since 2018.

## 2019-03-26 NOTE — Telephone Encounter (Signed)
Return pt's call - stated she received a shot yesterday. Starting 2 AM, she has been having joint pain, diarrhea and abd cramping. Stated when she got the flu vaccine last year, she did not have any problems. Wanting to know "what kind of shot " she received. Thanks

## 2019-03-26 NOTE — Assessment & Plan Note (Signed)
Influenza vaccine administered today.

## 2019-03-26 NOTE — Assessment & Plan Note (Signed)
Patient is adamant on taking narcotics and states PT only makes her symptoms worse. Will continue supportive care with ice/heat and NSAIDs.

## 2019-03-27 NOTE — Progress Notes (Signed)
Internal Medicine Clinic Attending  Case discussed with Dr. Bloomfield at the time of the visit.  We reviewed the resident's history and exam and pertinent patient test results.  I agree with the assessment, diagnosis, and plan of care documented in the resident's note.  

## 2019-04-08 ENCOUNTER — Other Ambulatory Visit: Payer: Self-pay

## 2019-04-08 ENCOUNTER — Ambulatory Visit
Admission: RE | Admit: 2019-04-08 | Discharge: 2019-04-08 | Disposition: A | Discharge status: Home / Self Care | Payer: Medicare Other | Source: Ambulatory Visit | Attending: Internal Medicine | Admitting: Internal Medicine

## 2019-04-08 DIAGNOSIS — Z1231 Encounter for screening mammogram for malignant neoplasm of breast: Secondary | ICD-10-CM | POA: Diagnosis not present

## 2019-04-12 ENCOUNTER — Ambulatory Visit (INDEPENDENT_AMBULATORY_CARE_PROVIDER_SITE_OTHER): Payer: Medicare Other | Admitting: Internal Medicine

## 2019-04-12 ENCOUNTER — Encounter: Payer: Self-pay | Admitting: Internal Medicine

## 2019-04-12 DIAGNOSIS — Z79899 Other long term (current) drug therapy: Secondary | ICD-10-CM

## 2019-04-12 DIAGNOSIS — R197 Diarrhea, unspecified: Secondary | ICD-10-CM

## 2019-04-12 HISTORY — DX: Diarrhea, unspecified: R19.7

## 2019-04-12 NOTE — Telephone Encounter (Signed)
F/U call -stated no one called her back. Stated she continues to have diarrhea/abd cramping; joint pain is not as bad. Offered to schedule an in person appt; stated she prefers not to d/t diarrhea. But agreeable to schedule a telehealth appt. I will ask front office to schedule.

## 2019-04-12 NOTE — Assessment & Plan Note (Addendum)
Patient calls in with concerns of diarrhea. It started on 10/11 then stopped for 2 days and restarted on Tuesday of this week. She is having >4 loose stools per day. There is no blood or mucus in her stools. No sick contact. The diarrhea is associated with abdominal pain, bloating, and nausea. She has not had any emesis. She has tried OTC medications including immodium and pepto-bismol without relief. She has not had any fevers. She has had productive cough and muscle cramps.   Unfortunately the nausea has limited her ability to eat or drink anything. She has been doing a "popsicle" diet since the onset of symptoms. PO intake has also results in abdominal pain and bloating.   Review of medical records shows she is on HCTZ and has been taking this throughout her illness.   A/P: - Given diarrhea and decreased PO intake causing muscle cramps while on HCTZ have recommended urgent evaluation to check potassium and get IV fluids. She will call her grandson to see if she can get to the ED. Discussed that hypokalemia can increase risk for cardiac arrhythmias resulting in permanent disability or death. Discussed calling EMS if symptoms progress.  - Her use of omeprazole also places her at risk for community acquired C. Diff. I have advised her to stop using immodium until infectious etiologies can be excluded.

## 2019-04-12 NOTE — Telephone Encounter (Signed)
Spoke with the pt.  She has been added for a Telehealth visit for this afternoon 04/12/2019.

## 2019-04-12 NOTE — Progress Notes (Signed)
   CC: Diarrhea  This is a telephone encounter between Alexis Kline and [name] on 04/12/2019 for diarrhea. The visit was conducted with the patient located at home and [facility] at [facility]. The patient's identity was confirmed using their DOB and current address. The patient has consented to being evaluated through a telephone encounter and understands the associated risks (an examination cannot be done and the patient may need to come in for an appointment) / benefits (allows the patient to remain at home, decreasing exposure to coronavirus). I personally spent 13 minutes on medical discussion.   HPI:  [name] is a 73 y.o. with PMH as below.   Please see A&P for assessment of the patient's acute and chronic medical conditions.   Past Medical History:  Diagnosis Date  . APPENDECTOMY, HX OF 07/10/2006   Qualifier: Diagnosis of  By: Oretha Ellis    . CAD (coronary artery disease) 2007   nonobstructive, 30% LAD 02/2006  . COPD (chronic obstructive pulmonary disease) ([place])   . 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 GERD (gastroesophageal reflux disease)   . Hemorrhoids   . HLD (hyperlipidemia)   . Hypertension   . HYSTERECTOMY, HX OF 07/10/2006   Qualifier: Diagnosis of  By: Oretha Ellis    . Myocardial infarct, old    Review of Systems:  Performed and all others negative.  Assessment & Plan:   See Encounters Tab for problem based charting.  Patient discussed with Dr. Lynnae January

## 2019-04-15 NOTE — Progress Notes (Signed)
Internal Medicine Clinic Attending  Case discussed with Dr. Helberg at the time of the visit.  We reviewed the resident's history and exam and pertinent patient test results.  I agree with the assessment, diagnosis, and plan of care documented in the resident's note.    

## 2019-04-18 IMAGING — CR DG HIP (WITH OR WITHOUT PELVIS) 2-3V*L*
3 series · 3 of 3 positions shown · non-contrast
Comparison: None.

CLINICAL DATA: Chronic left hip pain without known injury.

EXAM:
DG HIP (WITH OR WITHOUT PELVIS) 2-3V LEFT

[pelvis ap]
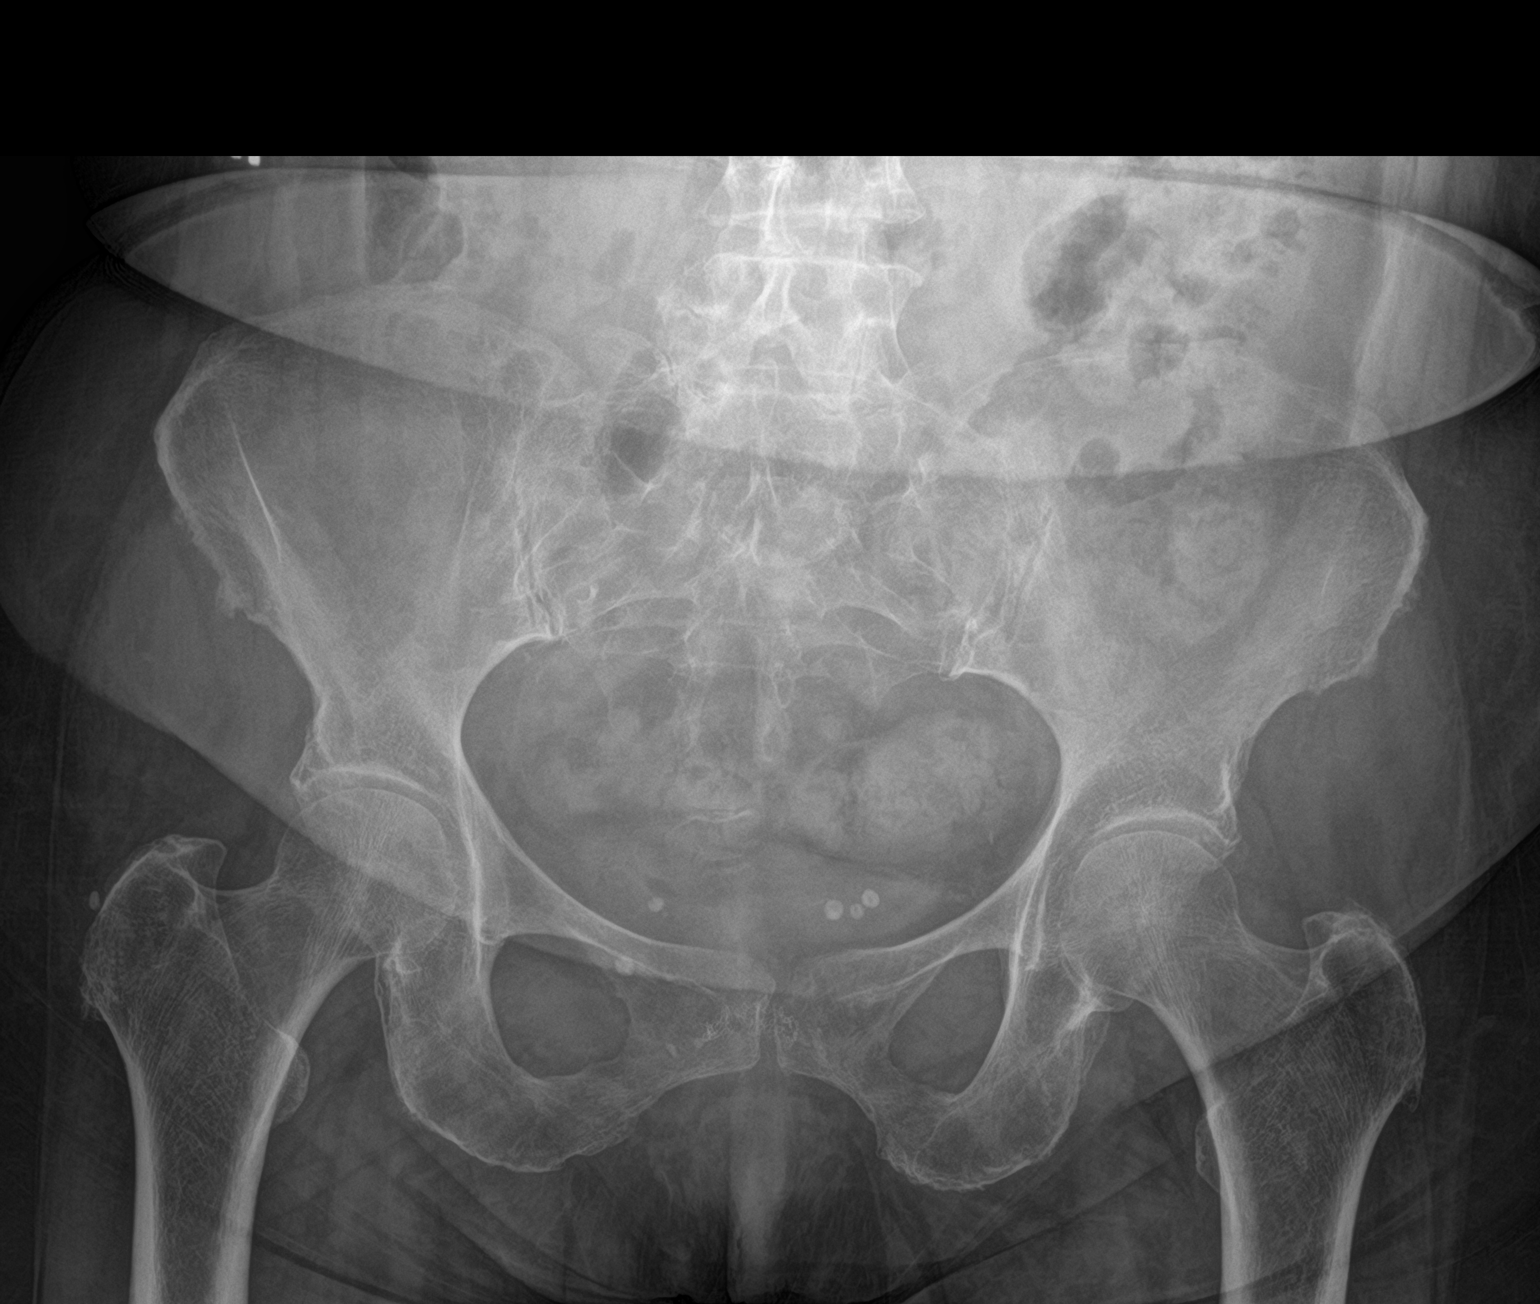

[hip ap]
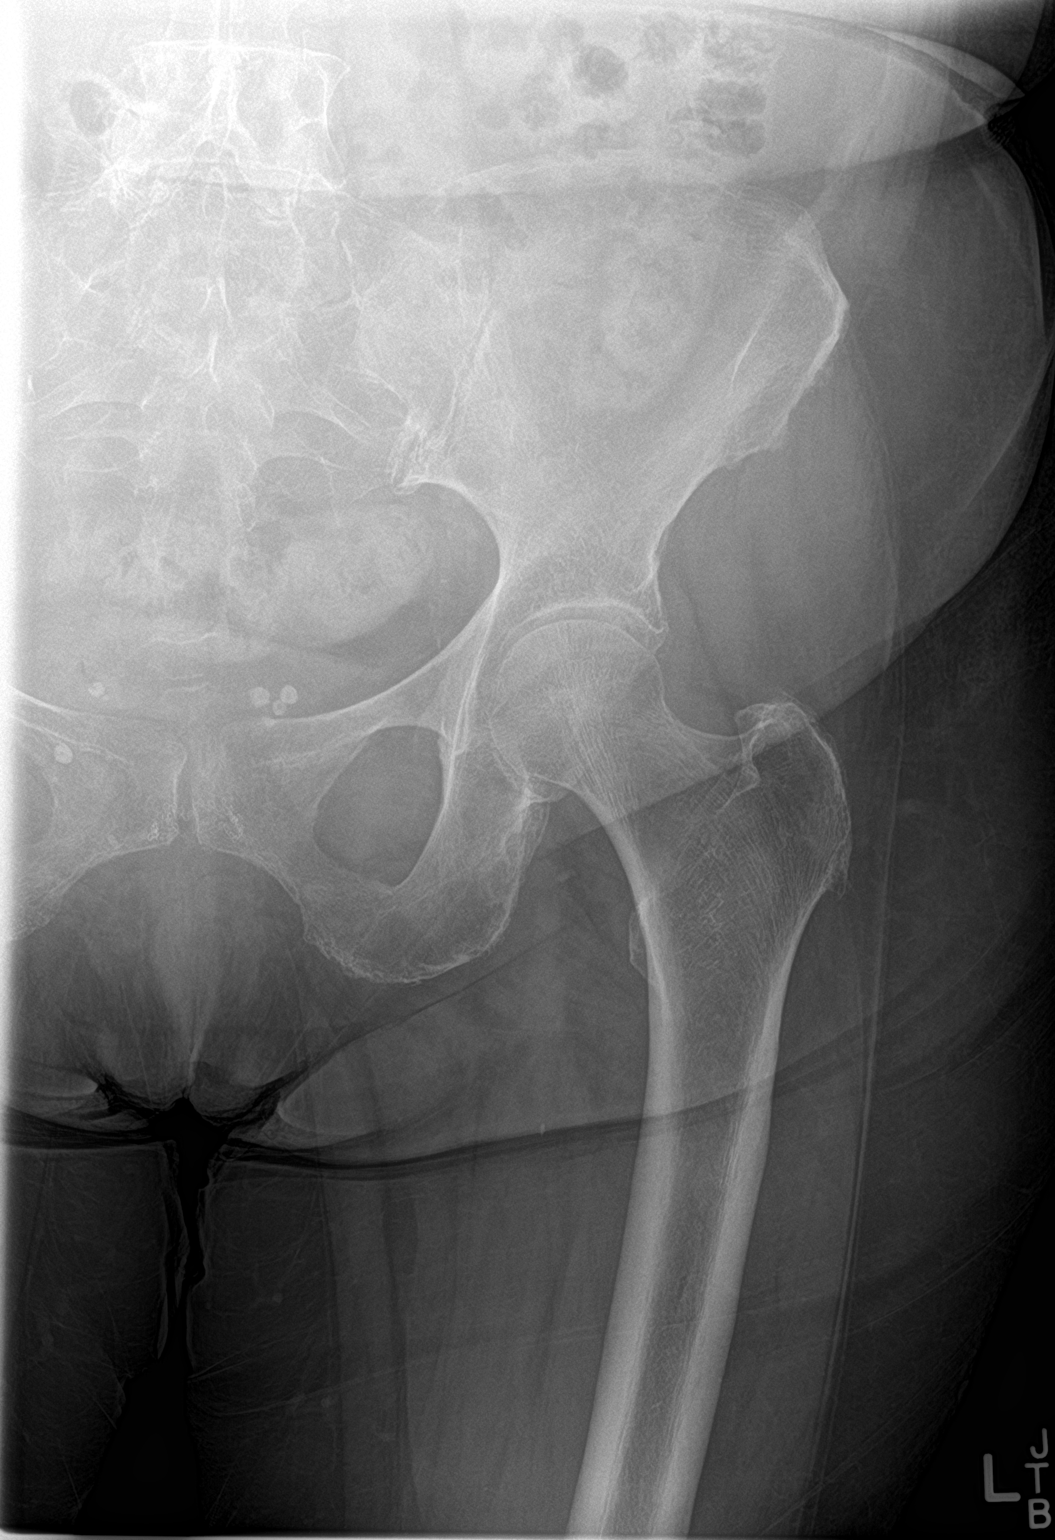

[hip lat]
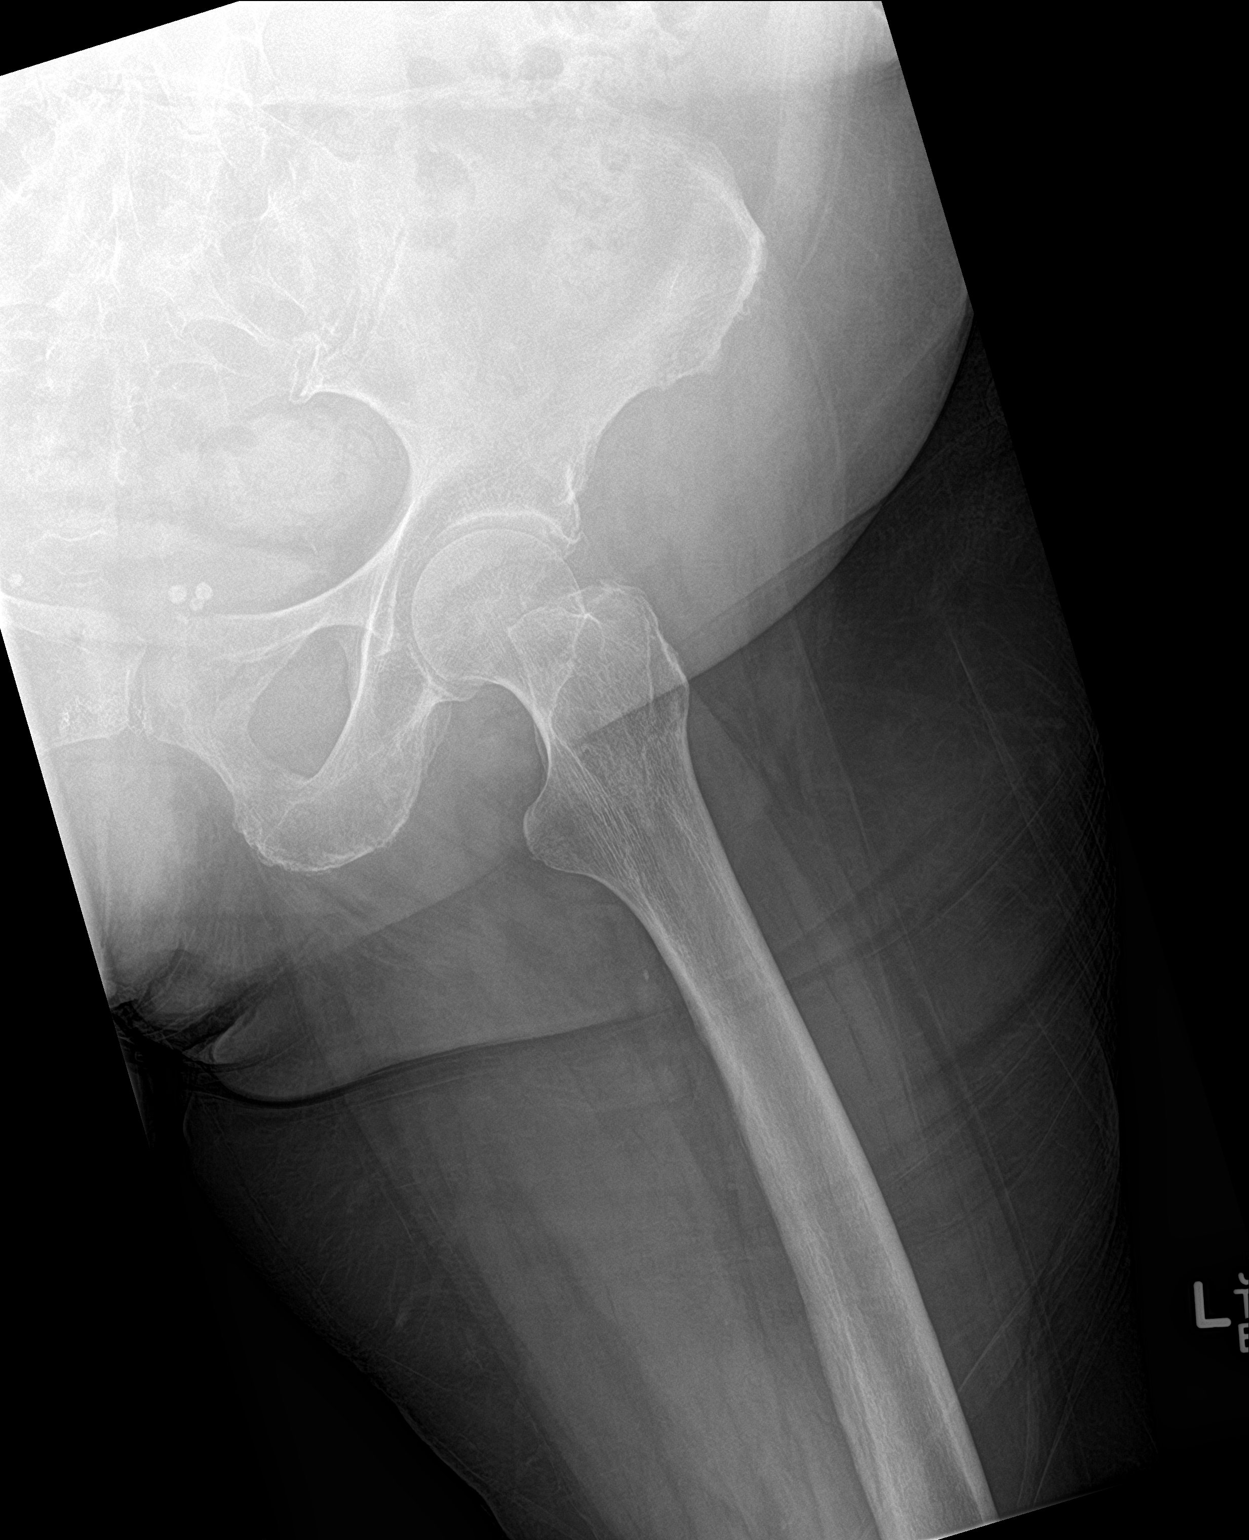

[3 of 3 positions shown; findings below may reference images not displayed]

FINDINGS: There is no evidence of hip fracture or dislocation. There is no
evidence of arthropathy or other focal bone abnormality.
IMPRESSION: Negative.

## 2019-04-23 ENCOUNTER — Encounter: Payer: Self-pay | Admitting: Internal Medicine

## 2019-04-26 ENCOUNTER — Other Ambulatory Visit: Payer: Self-pay | Admitting: Adult Health

## 2019-04-26 DIAGNOSIS — I10 Essential (primary) hypertension: Secondary | ICD-10-CM

## 2019-05-03 DIAGNOSIS — Z719 Counseling, unspecified: Secondary | ICD-10-CM | POA: Diagnosis not present

## 2019-05-25 ENCOUNTER — Other Ambulatory Visit: Payer: Self-pay | Admitting: Internal Medicine

## 2019-05-28 ENCOUNTER — Other Ambulatory Visit: Payer: Self-pay | Admitting: Internal Medicine

## 2019-05-31 ENCOUNTER — Ambulatory Visit: Payer: Medicare Other | Admitting: Cardiovascular Disease

## 2019-06-28 ENCOUNTER — Other Ambulatory Visit: Payer: Self-pay | Admitting: Cardiovascular Disease

## 2019-07-04 ENCOUNTER — Telehealth: Payer: Self-pay | Admitting: Internal Medicine

## 2019-07-04 ENCOUNTER — Telehealth: Payer: Self-pay | Admitting: Cardiovascular Disease

## 2019-07-04 ENCOUNTER — Other Ambulatory Visit: Payer: Self-pay

## 2019-07-04 ENCOUNTER — Ambulatory Visit (INDEPENDENT_AMBULATORY_CARE_PROVIDER_SITE_OTHER): Payer: Medicare Other | Admitting: Internal Medicine

## 2019-07-04 DIAGNOSIS — R109 Unspecified abdominal pain: Secondary | ICD-10-CM

## 2019-07-04 DIAGNOSIS — R0781 Pleurodynia: Secondary | ICD-10-CM | POA: Diagnosis not present

## 2019-07-04 DIAGNOSIS — R10A2 Flank pain, left side: Secondary | ICD-10-CM

## 2019-07-04 DIAGNOSIS — G8929 Other chronic pain: Secondary | ICD-10-CM | POA: Diagnosis not present

## 2019-07-04 DIAGNOSIS — M545 Low back pain: Secondary | ICD-10-CM | POA: Diagnosis not present

## 2019-07-04 HISTORY — DX: Unspecified abdominal pain: R10.9

## 2019-07-04 NOTE — Telephone Encounter (Signed)
Return pt's call - stated she had called her cardiologist's office, who recommended her to call her PCP. Stated she has difficulty breathing x 2-3 days; p cough of yellowish-green to white phlegm. Denies cp, fever, change in taste. Has not been tested for covid. Stated she has pna and pleurisy before; "does not feel pneumonia; feels more like pleurisy". Agreeable to telehealth appt - appt scheduled for today @ 1415PM.  Informed to call 911 if symptoms worsen. Please advised.

## 2019-07-04 NOTE — Telephone Encounter (Signed)
Pt called to report that she has been having chest congestion, sob, pain with inspiration, productive cough with multicolored sputum production for the past 2 days.. she denies fever..She has h/o pneumonia and pleurisy but says this feels different and worse.   I strongly urged her to call her PCP.. Dr. Koleen Distance for potential pneumonia and possible COVID... pt agreed and says she will call now.

## 2019-07-04 NOTE — Telephone Encounter (Signed)
    Pt c/o Shortness Of Breath: STAT if SOB developed within the last 24 hours or pt is noticeably SOB on the phone  1. Are you currently SOB (can you hear that pt is SOB on the phone)? yes  2. How long have you been experiencing SOB? A while, but it has just gotten serious over the past couple days  3. Are you SOB when sitting or when up moving around? All the time  4. Are you currently experiencing any other symptoms? Painful cough, has a hard time taking a deep breath Pt has a pain in her lungs and continues to cough. She has had pleurisy in the past. She said she has an excruciating pain every time she takes a breath, and she brings up a lot of phlegm. She wanted to know if she needed to be seen by Dr. Claiborne Billings.  Her BP has been good, and she says she does not have a fever

## 2019-07-04 NOTE — Progress Notes (Signed)
Internal Medicine Clinic Attending  Case discussed with Dr. Seawell at the time of the visit.  We reviewed the resident's history and exam and pertinent patient test results.  I agree with the assessment, diagnosis, and plan of care documented in the resident's note.  Breanna Shorkey, M.D., Ph.D.  

## 2019-07-04 NOTE — Telephone Encounter (Signed)
Pt calling to report congestion and problems breathing x 3 days and would ike a call back as soon as possible.

## 2019-07-04 NOTE — Assessment & Plan Note (Addendum)
She does have a history of previous pleuritic chest pain with work up for PE in 2019 which was negative. Her symptoms are consistent with pleurisy vs MSK and are worse with movement and breathing in and out. Will need to rule out covid and evaluate for possible pneumonia as well as she has had consistent productive cough the past four days.   - come for clinic appointment tomorrow for labs, physical exam, covid test, and possible chest xr - ibuprofen 600 mg prn and tylenol alternating, encouraged to use ice and heat as well - if worsening SOB, develops dizziness, instructed to go to the ER - will work on assisting with transportation as she will be tested for covid here.

## 2019-07-04 NOTE — Progress Notes (Signed)
   CC: flank pain, lung pain  This is a telephone encounter between [name] and Marty Heck on 07/04/2019 for pleuritic pain and flank pain. The visit was conducted with the patient located at home and Marty Heck at [facility]. The patient's identity was confirmed using their DOB and current address. The patient has consented to being evaluated through a telephone encounter and understands the associated risks (an examination cannot be done and the patient may need to come in for an appointment) / benefits (allows the patient to remain at home, decreasing exposure to coronavirus). I personally spent 17 minutes on medical discussion.   HPI:  Alexis Kline is a 74 y.o. with PMH as below.   Please see A&P for assessment of the patient's acute and chronic medical conditions.   Her left lung has been hurting her a lot. It's been "messed up" for about two days. This pain has happened before but not this bad. The pain is in the lower part of the lung area and is in the back. It feels pleurisy when she would get fluid in her lungs. The active movement of breathing is what hurts the most, sitting and standing hurts even more. She hasn't had fluid on her lungs in a long time but believes this was previously because of pneumonia.  She endorses cough with some phlegm about four days. She goes to take a breath and it's like her throat wants to close up. She also endorses shortness of breath, denies fever, recent sick contacts, upper chest pain, palpitations, nausea. She denies dizziness. She denies lower extremity swelling.  She states her kidney sometimes hurts as well and has been hurting a little more than usual. It's a lower back pain just below her ribs and just above her hip. She has a history of kidney infections. She denies burning with urination but does run to the bathroom quite a lot, which is sometimes normal for her. Urine color normal, no blood in her urine. No abdominal pain,  just the back pain.    Past Medical History:  Diagnosis Date  . APPENDECTOMY, HX OF 07/10/2006   Qualifier: Diagnosis of  By: Oretha Ellis    . CAD (coronary artery disease) 2007   nonobstructive, 30% LAD 02/2006  . COPD (chronic obstructive pulmonary disease) ([place])   . 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 GERD (gastroesophageal reflux disease)   . Hemorrhoids   . HLD (hyperlipidemia)   . Hypertension   . HYSTERECTOMY, HX OF 07/10/2006   Qualifier: Diagnosis of  By: Oretha Ellis    . Myocardial infarct, old    Review of Systems:   ROS negative except as noted in HPI   Assessment & Plan:   See Encounters Tab for problem based charting.  Patient discussed with Dr. Rebeca Alert

## 2019-07-04 NOTE — Assessment & Plan Note (Signed)
She does have a history of chronic left lower back pain but states this feels different. Her flank pain is concerning for possible nephritis as well. She is not having dysuria but is having increased frequency and urgency.   - cont. Drinking lots of fluids - f/u in clinic tomorrow morning for evaluation, UA, and BMP  - alternate tylenol, ibuprofen, heat and ice

## 2019-07-05 ENCOUNTER — Ambulatory Visit: Payer: Medicare Other

## 2019-07-09 ENCOUNTER — Ambulatory Visit (INDEPENDENT_AMBULATORY_CARE_PROVIDER_SITE_OTHER): Payer: Medicare Other | Admitting: Cardiovascular Disease

## 2019-07-09 ENCOUNTER — Encounter: Payer: Self-pay | Admitting: Cardiovascular Disease

## 2019-07-09 ENCOUNTER — Other Ambulatory Visit: Payer: Self-pay

## 2019-07-09 VITALS — BP 147/86 | HR 61 | Temp 97.2°F | Ht 61.0 in | Wt 158.0 lb

## 2019-07-09 DIAGNOSIS — I2510 Atherosclerotic heart disease of native coronary artery without angina pectoris: Secondary | ICD-10-CM | POA: Diagnosis not present

## 2019-07-09 DIAGNOSIS — Z87891 Personal history of nicotine dependence: Secondary | ICD-10-CM

## 2019-07-09 DIAGNOSIS — Z79899 Other long term (current) drug therapy: Secondary | ICD-10-CM | POA: Diagnosis not present

## 2019-07-09 DIAGNOSIS — K219 Gastro-esophageal reflux disease without esophagitis: Secondary | ICD-10-CM

## 2019-07-09 DIAGNOSIS — I10 Essential (primary) hypertension: Secondary | ICD-10-CM | POA: Diagnosis not present

## 2019-07-09 DIAGNOSIS — E785 Hyperlipidemia, unspecified: Secondary | ICD-10-CM | POA: Diagnosis not present

## 2019-07-09 DIAGNOSIS — E782 Mixed hyperlipidemia: Secondary | ICD-10-CM | POA: Diagnosis not present

## 2019-07-09 NOTE — Patient Instructions (Signed)
Medication Instructions:  Continue current medications  *If you need a refill on your cardiac medications before your next appointment, please call your pharmacy*  Lab Work: CBC, CMP, TSH, Fasting Lipids  If you have labs (blood work) drawn today and your tests are completely normal, you will receive your results only by: Marland Kitchen MyChart Message (if you have MyChart) OR . A paper copy in the mail If you have any lab test that is abnormal or we need to change your treatment, we will call you to review the results.  Testing/Procedures: None Ordered  Follow-Up: At [facility], you and your health needs are our priority.  As part of our continuing mission to provide you with exceptional heart care, we have created designated Provider Care Teams.  These Care Teams include your primary Cardiologist (physician) and Advanced Practice Providers (APPs -  Physician Assistants and Nurse Practitioners) who all work together to provide you with the care you need, when you need it.  Your next appointment:   1 year(s)  The format for your next appointment:   In Person  Provider:   Shelva Majestic, MD

## 2019-07-09 NOTE — Progress Notes (Signed)
Cardiology Office Note    Date:  07/11/2019   ID:  Alexis Kline, DOB 1946/04/20, MRN [id number]  PCP:  Alexis Bison, DO  Cardiologist:  Alexis Majestic, MD   No chief complaint on file.   History of Present Illness:  Alexis Kline is a 74 y.o. female who presents for a 7 month follow-up cardiology evaluation  [name]  has a greater than 30 year history of hypertension, a history of hyperlipidemia, as well as GERD.  She also has a history of osteopenia.  Recently, she developed a bandlike chest pressure around her upper chest without radiation that occurred at rest and lasted for 15-20 minutes with spontaneous resolution.  Her symptoms were also associated with shortness of breath and she felt that she could not catch her breath.  She denies any classic exertional symptomatology.  Reportedly also has a history of chronic stable angina, and remotely had undergone cardiac catheterization.  In 2012 .  She was found to have 30% areas of segmental disease in her LAD.  Smoking cessation was advised.  She was treated medically.  She has a history of bilateral neck pain radiating to her shoulders and has chronic cervical of musculoskeletal discomfort from traumatic injury over 20 years ago.  She was recently evaluated by Dr. Quay Kline and with her recent chest pain symptomatology she is now referred for cardiology evaluation.  Of note, the patient states she drinks at least 8 cups of coffee per day.  She admits to occasional palpitations.  When I initially saw her, we discussed significant reduction in caffeine intake.  Her blood pressure was upper normal in width.  Her chest pain.  I recommended titration of carvedilol to 18.75 mg twice a day.  She underwent an echo Doppler study on 11/09/2016 which showed hyperdynamic LV function with an EF of 65-70%.  There was grade 1 diastolic dysfunction.  There was mild mitral annular calcification.  A nuclear perfusion study showed an EF of 81%.   There were no ECG changes during stress.  She had normal perfusion.  Laboratory showed a hemoglobin of 12.7 and hematocrit of 40.8.  TSH was normal at 1.49.   I saw her in November 2018.  At that time she was under significant stress to her daughter who is 39 years old had just been diagnosed with stage IV lung cancer on April 19, 2017.  Unfortunately, her daughter died on 06-09-17.  I  saw her in September 2019 at which time she had noticed recent significant blood pressure lability. She has not had recent laboratory. She denies any episodes of chest tightness or significant shortness of breath. She has been on hydrochlorothiazide 25 mg and carvedilol 25 mg twice a day for blood pressure. She was on atorvastatin 40 mg daily and has GERD on omeprazole.   During that evaluation, her blood pressure was significantly elevated at 201/103.  Her daughter had recently passed away.  I added amlodipine 5 mg initially to her medical regimen.  She was seen on May 07, 2018 and follow-up by Alexis Kline and blood pressure was significantly improved at 118/68.  I last saw her in a telemedicine evaluation on November 23, 2018.  Recently, her blood pressure has continued to be fairly well controlled but at times may still increase slightly.  She had noticed some occasional chest fluttering's of short duration.  She has reduced her caffeine intake.  She admits to mild swelling around her right ankle.  She denies chest pain PND orthopnea she denies exertional dyspnea.    Since her last evaluation, she has felt well.  She specifically denies any chest pain or shortness of breath.  She walks with a cane.  She has continued to be on amlodipine 5 mg, carvedilol 25 mg twice a day, and hydrochlorothiazide 25 mg daily for hypertension.  She is on atorvastatin 40 mg for hyperlipidemia.  She continues to be on omeprazole for GERD.  She presents for reevaluation.   Past Medical History:  Diagnosis Date  .  APPENDECTOMY, HX OF 07/10/2006   Qualifier: Diagnosis of  By: Alexis Kline    . CAD (coronary artery disease) 2007   nonobstructive, 30% LAD 02/2006  . COPD (chronic obstructive pulmonary disease) ([place])   . 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 GERD (gastroesophageal reflux disease)   . Hemorrhoids   . HLD (hyperlipidemia)   . Hypertension   . HYSTERECTOMY, HX OF 07/10/2006   Qualifier: Diagnosis of  By: Alexis Kline    . Myocardial infarct, old     Past Surgical History:  Procedure Laterality Date  . ABDOMINAL HYSTERECTOMY  1988  . APPENDECTOMY  1966  . BLADDER SUSPENSION  1997  . BREAST EXCISIONAL BIOPSY Right 1988  . BREAST LUMPECTOMY     right breat  . [place]   3 operations in right hand and 2 operations in left hand. Dr. Redmond Kline in [place]  . PTCA  2007  . rectal stapling  2008   Done by Dr. Marlou Kline  . RECTAL SURGERY  01/07/2007   Dr. Marlou Kline in [place].   Marland Kitchen SHOULDER SURGERY  1991   Right shoulder surgery in 1991 and left shoulder surgery in 1992 by Dr. Redmond Kline in [place].    Current Medications: Outpatient Medications Prior to Visit  Medication Sig Dispense Refill  . amLODipine (NORVASC) 5 MG tablet TAKE 1 TABLET EACH DAY. 90 tablet 3  . aspirin 81 MG EC tablet Take 1 tablet (81 mg total) by mouth daily. 90 tablet 3  . ASPIRIN-ACETAMINOPHEN-CAFFEINE PO Take by mouth as needed.    Marland Kitchen atorvastatin (LIPITOR) 40 MG tablet Take 0.5 tablets (20 mg total) by mouth daily. 90 tablet 2  . B Complex-Biotin-FA (SUPER B-COMPLEX PO) Take 1 tablet by mouth daily.      . carvedilol (COREG) 25 MG tablet TAKE 1 TABLET BY MOUTH TWICE DAILY WITH A MEAL. 180 tablet 0  . hydrochlorothiazide (HYDRODIURIL) 25 MG tablet TAKE 1 TABLET ONCE DAILY. 90 tablet 0  . nitroGLYCERIN (NITROSTAT) 0.4 MG SL tablet DISSOLVE 1 TABLET UNDER TONGUE AS NEEDED FOR CHEST PAIN,MAY REPEAT IN5 MINUTES FOR 3 DOSES. 25 tablet 1  . omeprazole (PRILOSEC) 40 MG  capsule TAKE (1) CAPSULE TWICE DAILY. 180 capsule 0  . senna-docusate (SENNALAX-S) 8.6-50 MG tablet Take 2 tablets by mouth daily as needed (for constipation). 60 tablet 5  . calcium-vitamin D (OSCAL 500/200 D-3) 500-200 MG-UNIT tablet Take 2 tablets by mouth daily with breakfast. 90 tablet 1   No facility-administered medications prior to visit.     Allergies:   [name] isothiocyanate], Iodine, Other, and Tape   Social History   Socioeconomic History  . Marital status: Widowed    Spouse name: Not on file  . Number of children: Not on file  . Years of education: Not on file  . Highest education level: Not on file  Occupational History  .  Not on file  Tobacco Use  . Smoking status: Former Smoker    Years: 54.00    Types: Cigarettes    Quit date: 06/20/2013    Years since quitting: 6.0  . Smokeless tobacco: Never Used  Substance and Sexual Activity  . Alcohol use: Yes    Alcohol/week: 0.0 standard drinks    Comment: rarely "maybe a drink of New Year's"  . Drug use: No  . Sexual activity: Not Currently  Other Topics Concern  . Not on file  Social History Narrative  . Not on file   Social Determinants of Health   Financial Resource Strain:   . Difficulty of Paying Living Expenses: Not on file  Food Insecurity:   . Worried About Charity fundraiser in the Last Year: Not on file  . Ran Out of Food in the Last Year: Not on file  Transportation Needs:   . Lack of Transportation (Medical): Not on file  . Lack of Transportation (Non-Medical): Not on file  Physical Activity:   . Days of Exercise per Week: Not on file  . Minutes of Exercise per Session: Not on file  Stress:   . Feeling of Stress : Not on file  Social Connections:   . Frequency of Communication with Friends and Family: Not on file  . Frequency of Social Gatherings with Friends and Family: Not on file  . Attends Religious Services: Not on file  . Active Member of Clubs or Organizations: Not on file  .  Attends Archivist Meetings: Not on file  . Marital Status: Not on file    Additional social history is notable in that she is a former truck Geophysicist/field seismologist.  She is widowed for 20 years.  She had 2 children, 4 grandchildren, and 11 great grandchildren.  She lives by herself.  She has 2 years of college.  She retired in 1970.  She previously had smoked for 55 years.  She does walk approximately 3-4 days per week.  Family History:  The patient's family history includes Heart disease in her sister; Hyperlipidemia in her father and mother; Hypertension in her father and mother.   ROS General: Negative; No fevers, chills, or night sweats;  HEENT: Negative; No changes in vision or hearing, sinus congestion, difficulty swallowing Pulmonary: Negative; No cough, wheezing, shortness of breath, hemoptysis Cardiovascular: see HPI GI: Positive for GERD GU: Negative; No dysuria, hematuria, or difficulty voiding Musculoskeletal: Chronic neck and arm pain since her remote trauma. Hematologic/Oncology: Negative; no easy bruising, bleeding Endocrine: Negative; no heat/cold intolerance; no diabetes Neuro: Negative; no changes in balance, headaches Skin: Negative; No rashes or skin lesions Psychiatric: Negative; No behavioral problems, depression Sleep: Negative; No snoring, daytime sleepiness, hypersomnolence, bruxism, restless legs, hypnogognic hallucinations, no cataplexy Other comprehensive 14 point system review is negative.   PHYSICAL EXAM:   VS:  BP (!) 147/86   Pulse 61   Temp (!) 97.2 F (36.2 C)   Ht '5\' 1"'$  (1.549 m)   Wt 158 lb (71.7 kg)   LMP 06/20/1986   SpO2 98%   BMI 29.85 kg/m     Repeat blood pressure by me was improved at 132/80  Wt Readings from Last 3 Encounters:  07/09/19 158 lb (71.7 kg)  03/25/19 157 lb 12.8 oz (71.6 kg)  11/23/18 156 lb 11.2 oz (71.1 kg)    General: Alert, oriented, no distress.  Skin: normal turgor, no rashes, warm and dry HEENT: Normocephalic,  atraumatic. Pupils equal round and  reactive to light; sclera anicteric; extraocular muscles intact;  Nose without nasal septal hypertrophy Mouth/Parynx benign; Mallinpatti scale 3 Neck: No JVD, no carotid bruits; normal carotid upstroke Lungs: clear to ausculatation and percussion; no wheezing or rales Chest wall: without tenderness to palpitation Heart: PMI not displaced, RRR, s1 s2 normal, 1/6 systolic murmur, no diastolic murmur, no rubs, gallops, thrills, or heaves Abdomen: soft, nontender; no hepatosplenomehaly, BS+; abdominal aorta nontender and not dilated by palpation. Back: no CVA tenderness Pulses 2+ Musculoskeletal: full range of motion, normal strength, no joint deformities Extremities: no clubbing cyanosis or edema, Homan's sign negative  Neurologic: grossly nonfocal; Cranial nerves grossly wnl Psychologic: Normal mood and affect   Studies/Labs Reviewed:   EKG:  EKG is ordered today. ECG (independently read by me): Normal sinus rhythm at 61 bpm.  Poor anterior R wave progression.  September 2019 ECG (independently read by me): Normal sinus rhythm at 62 bpm.  Poor anterior R wave progression.  Normal intervals.  No ectopy.  May 17, 2017 ECG (independently read by me): Normal sinus rhythm at 74 bpm.  QS complex.  Anteroseptally  11/22/2016 ECG (independently read by me): Normal sinus rhythm at 79 bpm.  No ectopy.  Normal intervals.  No significant ST changes.  10/26/2016 ECG (independently read by me): Normal sinus rhythm at 65 bpm.  Normal intervals.  No ectopy.  No significant ST-T changes.  Recent Labs: BMP Latest Ref Rng & Units 07/09/2019 03/25/2019 07/13/2018  Glucose 65 - 99 mg/dL 87 83 94  BUN 8 - 27 mg/dL '17 15 13  '$ Creatinine 0.57 - 1.00 mg/dL 0.61 0.80 0.73  BUN/Creat Ratio 12 - '28 28 19 18  '$ Sodium 134 - 144 mmol/L 143 141 142  Potassium 3.5 - 5.2 mmol/L 4.8 3.5 3.8  Chloride 96 - 106 mmol/L 105 103 102  CO2 20 - 29 mmol/L '25 24 23  '$ Calcium 8.7 - 10.3  mg/dL 9.6 9.7 9.6     Hepatic Function Latest Ref Rng & Units 07/09/2019 07/13/2018 03/28/2018  Total Protein 6.0 - 8.5 g/dL 6.8 6.5 6.6  Albumin 3.7 - 4.7 g/dL 4.3 4.1 4.0  AST 0 - 40 IU/L 27 45(H) 40  ALT 0 - 32 IU/L 16 32 31  Alk Phosphatase 39 - 117 IU/L 56 56 54  Total Bilirubin 0.0 - 1.2 mg/dL 0.3 0.5 0.3  Bilirubin, Direct 0.00 - 0.40 mg/dL - 0.15 -    CBC Latest Ref Rng & Units 07/09/2019 03/28/2018 10/26/2016  WBC 3.4 - 10.8 x10E3/uL 8.6 6.3 7.9  Hemoglobin 11.1 - 15.9 g/dL 12.9 13.1 12.7  Hematocrit 34.0 - 46.6 % 43.0 41.9 40.8  Platelets 150 - 450 x10E3/uL 261 269 292   Lab Results  Component Value Date   MCV 84 07/09/2019   MCV 82 03/28/2018   MCV 80.2 10/26/2016   Lab Results  Component Value Date   TSH 1.460 07/09/2019   No results found for: HGBA1C   BNP No results found for: BNP  ProBNP No results found for: PROBNP   Lipid Panel     Component Value Date/Time   CHOL 170 07/09/2019 0941   TRIG 141 07/09/2019 0941   HDL 46 07/09/2019 0941   CHOLHDL 3.7 07/09/2019 0941   CHOLHDL 3.8 01/01/2014 1433   VLDL 38 01/01/2014 1433   LDLCALC 99 07/09/2019 0941     RADIOLOGY: No results found.   Additional studies/ records that were reviewed today include:  I reviewed the patient's prior cardiac catheterization from  11/03/2010.  I reviewed recent office records.   I reviewed her nuclear perfusion study, as well as her echo Doppler study and  Laboratory.  ECHO 10/20/2016 Study Conclusions  - Left ventricle: The cavity size was normal. Systolic function was vigorous. The estimated ejection fraction was in the range of 65% to 70%. Wall motion was normal; there were no regional wall motion abnormalities. Doppler parameters are consistent with abnormal left ventricular relaxation (grade 1 diastolic dysfunction). - Mitral valve: Calcified annulus  ASSESSMENT:    1. Essential hypertension   2. CAD in native artery   3. Mixed hyperlipidemia   4.  History of tobacco abuse   5. Gastroesophageal reflux disease without esophagitis   6. Medication management     PLAN:  [name] is a 74 year-old female has a history of significant tobacco abuse, having smoked for 55 years, as well as a greater than 30 year history of hypertension.  She was found to have mild nonobstructive CAD at cardiac catheterization in May 2012.  She has a history of hyperlipidemia.  She does not have any history of anginal symptomatology.  A  nuclear perfusion study in May 2018 showed normal perfusion with hyperdynamic ejection fraction and normal wall motion.  When I last saw her, she was significantly hypertensive.  Amlodipine was added to her regimen.  Her blood pressure today is significantly improved on a regimen now consisting of amlodipine 5 mg, carvedilol 25 mg twice a day and HCTZ.  She is still grieving over the death of her daughter who died with stage IV lung cancer.  She continues to be on atorvastatin for hyperlipidemia.  She has not had recent laboratory and a complete set of labs including a comprehensive metabolic panel, CBC, thyroid function studies and lipid studies will be obtained.  Adjustments to her medical regimen will be made if needed.  Her GERD is controlled with Prilosec.  I will see her in 1 year for follow-up evaluation or sooner as needed.   Medication Adjustments/Labs and Tests Ordered: Current medicines are reviewed at length with the patient today.  Concerns regarding medicines are outlined above.  Medication changes, Labs and Tests ordered today are listed in the Patient Instructions below. Patient Instructions  Medication Instructions:  Continue current medications  *If you need a refill on your cardiac medications before your next appointment, please call your pharmacy*  Lab Work: CBC, CMP, TSH, Fasting Lipids  If you have labs (blood work) drawn today and your tests are completely normal, you will receive your results only  by: Marland Kitchen MyChart Message (if you have MyChart) OR . A paper copy in the mail If you have any lab test that is abnormal or we need to change your treatment, we will call you to review the results.  Testing/Procedures: None Ordered  Follow-Up: At [facility], you and your health needs are our priority.  As part of our continuing mission to provide you with exceptional heart care, we have created designated Provider Care Teams.  These Care Teams include your primary Cardiologist (physician) and Advanced Practice Providers (APPs -  Physician Assistants and Nurse Practitioners) who all work together to provide you with the care you need, when you need it.  Your next appointment:   1 year(s)  The format for your next appointment:   In Person  Provider:   Shelva Majestic, MD        Signed, Alexis Majestic, MD  07/11/2019 7:00 PM    [facility] [address], [place], [place]  [address] Phone: [phone]

## 2019-07-10 ENCOUNTER — Ambulatory Visit: Payer: Medicare Other

## 2019-07-10 LAB — CBC
Hematocrit: 43 % (ref 34.0–46.6)
Hemoglobin: 12.9 g/dL (ref 11.1–15.9)
MCH: 25.1 pg — ABNORMAL LOW (ref 26.6–33.0)
MCHC: 30 g/dL — ABNORMAL LOW (ref 31.5–35.7)
MCV: 84 fL (ref 79–97)
Platelets: 261 10*3/uL (ref 150–450)
RBC: 5.14 x10E6/uL (ref 3.77–5.28)
RDW: 14.5 % (ref 11.7–15.4)
WBC: 8.6 10*3/uL (ref 3.4–10.8)

## 2019-07-10 LAB — COMPREHENSIVE METABOLIC PANEL
ALT: 16 IU/L (ref 0–32)
AST: 27 IU/L (ref 0–40)
Albumin/Globulin Ratio: 1.7 (ref 1.2–2.2)
Albumin: 4.3 g/dL (ref 3.7–4.7)
Alkaline Phosphatase: 56 IU/L (ref 39–117)
BUN/Creatinine Ratio: 28 (ref 12–28)
BUN: 17 mg/dL (ref 8–27)
Bilirubin Total: 0.3 mg/dL (ref 0.0–1.2)
CO2: 25 mmol/L (ref 20–29)
Calcium: 9.6 mg/dL (ref 8.7–10.3)
Chloride: 105 mmol/L (ref 96–106)
Creatinine, Ser: 0.61 mg/dL (ref 0.57–1.00)
GFR calc Af Amer: 104 mL/min/{1.73_m2} (ref >59)
GFR calc non Af Amer: 90 mL/min/{1.73_m2} (ref >59)
Globulin, Total: 2.5 g/dL (ref 1.5–4.5)
Glucose: 87 mg/dL (ref 65–99)
Potassium: 4.8 mmol/L (ref 3.5–5.2)
Sodium: 143 mmol/L (ref 134–144)
Total Protein: 6.8 g/dL (ref 6.0–8.5)

## 2019-07-10 LAB — LIPID PANEL
Chol/HDL Ratio: 3.7 ratio (ref 0.0–4.4)
Cholesterol, Total: 170 mg/dL (ref 100–199)
HDL: 46 mg/dL (ref >39)
LDL Chol Calc (NIH): 99 mg/dL (ref 0–99)
Triglycerides: 141 mg/dL (ref 0–149)
VLDL Cholesterol Cal: 25 mg/dL (ref 5–40)

## 2019-07-10 LAB — TSH: TSH: 1.46 u[IU]/mL (ref 0.450–4.500)

## 2019-07-11 ENCOUNTER — Encounter: Payer: Self-pay | Admitting: Cardiovascular Disease

## 2019-07-29 ENCOUNTER — Other Ambulatory Visit: Payer: Self-pay | Admitting: Cardiovascular Disease

## 2019-07-29 ENCOUNTER — Other Ambulatory Visit: Payer: Self-pay | Admitting: Adult Health

## 2019-07-29 DIAGNOSIS — I10 Essential (primary) hypertension: Secondary | ICD-10-CM

## 2019-07-30 ENCOUNTER — Other Ambulatory Visit: Payer: Self-pay

## 2019-07-30 DIAGNOSIS — I10 Essential (primary) hypertension: Secondary | ICD-10-CM

## 2019-08-26 ENCOUNTER — Other Ambulatory Visit: Payer: Self-pay | Admitting: Internal Medicine

## 2019-09-21 ENCOUNTER — Other Ambulatory Visit: Payer: Self-pay | Admitting: Cardiovascular Disease

## 2019-12-09 ENCOUNTER — Other Ambulatory Visit: Payer: Self-pay | Admitting: Internal Medicine

## 2020-01-03 ENCOUNTER — Other Ambulatory Visit: Payer: Self-pay | Admitting: Cardiovascular Disease

## 2020-01-03 NOTE — Telephone Encounter (Signed)
Rx(s) sent to pharmacy electronically.  

## 2020-02-21 ENCOUNTER — Encounter: Payer: Medicare Other | Admitting: Internal Medicine

## 2020-02-21 ENCOUNTER — Other Ambulatory Visit: Payer: Self-pay | Admitting: Cardiovascular Disease

## 2020-02-21 ENCOUNTER — Telehealth: Payer: Self-pay | Admitting: Internal Medicine

## 2020-02-21 ENCOUNTER — Other Ambulatory Visit: Payer: Self-pay | Admitting: Internal Medicine

## 2020-02-21 NOTE — Telephone Encounter (Signed)
Next appt scheduled 9/15.

## 2020-02-21 NOTE — Telephone Encounter (Signed)
Received message that pt was interested in personal care services. I returned the call to her this afternoon, to get more information on what she was looking for. She notes that she is wanting someone that can come into her home to assist in household chores as these have become more difficult due to her chronic medical conditions.  I relayed that this would most likely be through home health and that she would need to be seen in the office for face-face.  She is agreeable to this plan and has no further questions at this time. She will call the office to arrange an appt.  Mitzi Hansen, MD Internal Medicine Resident PGY-2 [facility] Pager: [phone] 02/21/2020 3:14 PM

## 2020-02-28 NOTE — Telephone Encounter (Signed)
Patient has Medicaid so can qualify for [facility]. This is a better option for her than HH as HH is for a limited time only with the addition of another [facility] need such as skilled nursing, PT or Speech Therapy. PCS form placed in Blue [employer]'s box for Dr. Darrick Meigs to complete upon her return. This telephone encounter should suffice for documentation of PCS needs. Hubbard Hartshorn, BSN, RN-BC

## 2020-03-04 ENCOUNTER — Encounter: Payer: Medicare Other | Admitting: Internal Medicine

## 2020-03-30 ENCOUNTER — Ambulatory Visit (INDEPENDENT_AMBULATORY_CARE_PROVIDER_SITE_OTHER): Payer: Medicare Other | Admitting: Internal Medicine

## 2020-03-30 ENCOUNTER — Encounter: Payer: Self-pay | Admitting: Internal Medicine

## 2020-03-30 VITALS — BP 138/89 | HR 63 | Temp 98.0°F | Wt 163.0 lb

## 2020-03-30 DIAGNOSIS — Z0000 Encounter for general adult medical examination without abnormal findings: Secondary | ICD-10-CM

## 2020-03-30 DIAGNOSIS — Z1211 Encounter for screening for malignant neoplasm of colon: Secondary | ICD-10-CM | POA: Diagnosis not present

## 2020-03-30 DIAGNOSIS — G894 Chronic pain syndrome: Secondary | ICD-10-CM

## 2020-03-30 DIAGNOSIS — Z23 Encounter for immunization: Secondary | ICD-10-CM

## 2020-03-30 DIAGNOSIS — Z1159 Encounter for screening for other viral diseases: Secondary | ICD-10-CM | POA: Diagnosis not present

## 2020-03-30 DIAGNOSIS — Z1231 Encounter for screening mammogram for malignant neoplasm of breast: Secondary | ICD-10-CM

## 2020-03-30 DIAGNOSIS — I10 Essential (primary) hypertension: Secondary | ICD-10-CM

## 2020-03-30 MED ORDER — DULOXETINE HCL 30 MG PO CPEP: 30.0000 mg | ORAL_CAPSULE | Freq: Every day | ORAL | 2 refills | Status: DC

## 2020-03-30 NOTE — Patient Instructions (Signed)
Alexis Kline, It was good seeing you today.   We discussed: Health maintenance screenings: we will do Hep C today. I have ordered mammogram and colonoscopy as well.   Chronic pain: Let's see if you get any relief with cymbalta. I will also see if I can get physical therapy to come to your home given your transportation limitations.   Your blood pressure looks good today, and we do not need to make any other medication changes.   Take care, Dr. Koleen Distance

## 2020-03-31 ENCOUNTER — Encounter: Payer: Self-pay | Admitting: Internal Medicine

## 2020-03-31 LAB — HEPATITIS C ANTIBODY: Hep C Virus Ab: <0.1 s/co ratio (ref 0.0–0.9)

## 2020-03-31 NOTE — Assessment & Plan Note (Signed)
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 She was previously on chronic opioid therapy, but stopped taking them a few years ago because she did not like being dependent on them.  She is currently taking Aleve as needed. She is interested in PT, but it is extremely difficult for her to get to office appointments with her limited mobility and lack of transportation. Will order home health services.  She has never tried Cymbalta. Will start her on 30 mg daily and if tolerating well, increase to target dose of 60 mg daily.

## 2020-03-31 NOTE — Assessment & Plan Note (Signed)
Patient's blood pressure at goal on Amlodipine, HCTZ, and Coreg. She is compliant with all medications and tolerates them well.  Continue current regimen.  Renal function was stable at last visit. Will repeat labs at next follow-up.

## 2020-03-31 NOTE — Progress Notes (Signed)
Internal Medicine Clinic Attending  Case discussed with Dr. Bloomfield  At the time of the visit.  We reviewed the resident's history and exam and pertinent patient test results.  I agree with the assessment, diagnosis, and plan of care documented in the resident's note.  

## 2020-03-31 NOTE — Progress Notes (Signed)
Established Patient Office Visit  Subjective:  Patient ID: Alexis Kline, female    DOB: 1945-09-18  Age: 74 y.o. MRN: [id number]  CC:  Chief Complaint  Patient presents with  . routine follow up    HPI [name] presents for follow-up on chronic HTN, back pain, and health maintenance screenings. Please see problem based charting for further details.   Past Medical History:  Diagnosis Date  . APPENDECTOMY, HX OF 07/10/2006   Qualifier: Diagnosis of  By: Oretha Ellis    . CAD (coronary artery disease) 2007   nonobstructive, 30% LAD 02/2006  . COPD (chronic obstructive pulmonary disease) ([place])   . 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 GERD (gastroesophageal reflux disease)   . Hemorrhoids   . HLD (hyperlipidemia)   . Hypertension   . HYSTERECTOMY, HX OF 07/10/2006   Qualifier: Diagnosis of  By: Oretha Ellis    . Myocardial infarct, old     Past Surgical History:  Procedure Laterality Date  . ABDOMINAL HYSTERECTOMY  1988  . APPENDECTOMY  1966  . BLADDER SUSPENSION  1997  . BREAST EXCISIONAL BIOPSY Right 1988  . BREAST LUMPECTOMY     right breat  . [place]   3 operations in right hand and 2 operations in left hand. Dr. Redmond Pulling in [place]  . PTCA  2007  . rectal stapling  2008   Done by Dr. Marlou Starks  . RECTAL SURGERY  01/07/2007   Dr. Marlou Starks in [place].   Marland Kitchen SHOULDER SURGERY  1991   Right shoulder surgery in 1991 and left shoulder surgery in 1992 by Dr. Redmond Pulling in [place].    Family History  Problem Relation Age of Onset  . Hyperlipidemia Mother   . Hypertension Mother   . Hyperlipidemia Father   . Hypertension Father   . Heart disease Sister   . Stroke Neg Hx   . Diabetes Neg Hx   . Colon cancer Neg Hx     Social History   Socioeconomic History  . Marital status: Widowed    Spouse name: Not on file  . Number of children: Not on file  . Years of education: Not on file  . Highest  education level: Not on file  Occupational History  . Not on file  Tobacco Use  . Smoking status: Former Smoker    Years: 54.00    Types: Cigarettes    Quit date: 06/20/2013    Years since quitting: 6.7  . Smokeless tobacco: Never Used  Substance and Sexual Activity  . Alcohol use: Yes    Alcohol/week: 0.0 standard drinks    Comment: rarely "maybe a drink of New Year's"  . Drug use: No  . Sexual activity: Not Currently  Other Topics Concern  . Not on file  Social History Narrative  . Not on file   Social Determinants of Health   Financial Resource Strain:   . Difficulty of Paying Living Expenses: Not on file  Food Insecurity:   . Worried About Charity fundraiser in the Last Year: Not on file  . Ran Out of Food in the Last Year: Not on file  Transportation Needs:   . Lack of Transportation (Medical): Not on file  . Lack of Transportation (Non-Medical): Not on file  Physical Activity:   . Days of Exercise per Week: Not on file  . Minutes of Exercise per Session: Not on  file  Stress:   . Feeling of Stress : Not on file  Social Connections:   . Frequency of Communication with Friends and Family: Not on file  . Frequency of Social Gatherings with Friends and Family: Not on file  . Attends Religious Services: Not on file  . Active Member of Clubs or Organizations: Not on file  . Attends Archivist Meetings: Not on file  . Marital Status: Not on file  Intimate Partner Violence:   . Fear of Current or Ex-Partner: Not on file  . Emotionally Abused: Not on file  . Physically Abused: Not on file  . Sexually Abused: Not on file    Outpatient Medications Prior to Visit  Medication Sig Dispense Refill  . amLODipine (NORVASC) 5 MG tablet TAKE 1 TABLET EACH DAY. 90 tablet 1  . aspirin 81 MG EC tablet Take 1 tablet (81 mg total) by mouth daily. 90 tablet 3  . ASPIRIN-ACETAMINOPHEN-CAFFEINE PO Take by mouth as needed.    Marland Kitchen atorvastatin (LIPITOR) 40 MG tablet Take 0.5  tablets (20 mg total) by mouth daily. 90 tablet 2  . atorvastatin (LIPITOR) 40 MG tablet Take 0.5 tablets (20 mg total) by mouth daily. 45 tablet 3  . B Complex-Biotin-FA (SUPER B-COMPLEX PO) Take 1 tablet by mouth daily.      . calcium-vitamin D (OSCAL 500/200 D-3) 500-200 MG-UNIT tablet Take 2 tablets by mouth daily with breakfast. 90 tablet 1  . carvedilol (COREG) 25 MG tablet Take 1 tablet (25 mg total) by mouth 2 (two) times daily with a meal. 180 tablet 1  . hydrochlorothiazide (HYDRODIURIL) 25 MG tablet TAKE 1 TABLET ONCE DAILY. 90 tablet 3  . nitroGLYCERIN (NITROSTAT) 0.4 MG SL tablet DISSOLVE 1 TABLET UNDER TONGUE AS NEEDED FOR CHEST PAIN,MAY REPEAT IN5 MINUTES FOR 3 DOSES. 25 tablet 1  . omeprazole (PRILOSEC) 40 MG capsule TAKE (1) CAPSULE TWICE DAILY. 180 capsule 0  . senna-docusate (SENNALAX-S) 8.6-50 MG tablet Take 2 tablets by mouth daily as needed (for constipation). 60 tablet 5   No facility-administered medications prior to visit.    Allergies  Allergen Reactions  . [name] Isothiocyanate] Shortness Of Breath  . Iodine     Skin discoloration (turns orange)  . Other     Aloe Vera and mustard "causes me to faint"  . Tape     ROS Review of Systems  Constitutional: Negative for activity change, chills, fever and unexpected weight change.  HENT: Negative for hearing loss and trouble swallowing.   Eyes: Negative for visual disturbance.  Respiratory: Negative for cough and shortness of breath.   Cardiovascular: Negative for chest pain and palpitations.  Gastrointestinal: Negative for blood in stool, constipation, diarrhea and nausea.  Genitourinary: Negative for dysuria.  Musculoskeletal: Positive for arthralgias and back pain. Negative for joint swelling.  Skin: Negative for wound.  Neurological: Negative for dizziness, syncope, weakness, numbness and headaches.  Psychiatric/Behavioral: Negative for sleep disturbance.      Objective:    Physical  Exam Constitutional:      General: She is not in acute distress.    Appearance: Normal appearance.  Eyes:     Conjunctiva/sclera: Conjunctivae normal.  Cardiovascular:     Rate and Rhythm: Normal rate and regular rhythm.     Pulses: Normal pulses.  Pulmonary:     Effort: Pulmonary effort is normal.     Breath sounds: Normal breath sounds.  Abdominal:     General: Bowel sounds are normal. There is  no distension.     Palpations: Abdomen is soft.     Tenderness: There is no abdominal tenderness.  Musculoskeletal:     Cervical back: Normal range of motion and neck supple.     Right lower leg: No edema.     Left lower leg: No edema.  Neurological:     General: No focal deficit present.     Mental Status: She is alert.  Psychiatric:        Mood and Affect: Mood normal.        Behavior: Behavior normal.     BP 138/89 (BP Location: Left Arm, Patient Position: Sitting, Cuff Size: Normal)   Pulse 63   Temp 98 F (36.7 C) (Oral)   Wt 163 lb (73.9 kg)   LMP 06/20/1986   SpO2 95%   BMI 30.80 kg/m  Wt Readings from Last 3 Encounters:  03/30/20 163 lb (73.9 kg)  07/09/19 158 lb (71.7 kg)  03/25/19 157 lb 12.8 oz (71.6 kg)     Health Maintenance Due  Topic Date Due  . COVID-19 Vaccine (1) Never done  . COLONOSCOPY  02/04/2020    There are no preventive care reminders to display for this patient.  Lab Results  Component Value Date   TSH 1.460 07/09/2019   Lab Results  Component Value Date   WBC 8.6 07/09/2019   HGB 12.9 07/09/2019   HCT 43.0 07/09/2019   MCV 84 07/09/2019   PLT 261 07/09/2019   Lab Results  Component Value Date   NA 143 07/09/2019   K 4.8 07/09/2019   CO2 25 07/09/2019   GLUCOSE 87 07/09/2019   BUN 17 07/09/2019   CREATININE 0.61 07/09/2019   BILITOT 0.3 07/09/2019   ALKPHOS 56 07/09/2019   AST 27 07/09/2019   ALT 16 07/09/2019   PROT 6.8 07/09/2019   ALBUMIN 4.3 07/09/2019   CALCIUM 9.6 07/09/2019   Lab Results  Component Value Date    CHOL 170 07/09/2019   Lab Results  Component Value Date   HDL 46 07/09/2019   Lab Results  Component Value Date   LDLCALC 99 07/09/2019   Lab Results  Component Value Date   TRIG 141 07/09/2019   Lab Results  Component Value Date   CHOLHDL 3.7 07/09/2019   No results found for: HGBA1C    Assessment & Plan:   Problem List Items Addressed This Visit      Cardiovascular and Mediastinum   Essential hypertension (Chronic)    Patient's blood pressure at goal on Amlodipine, HCTZ, and Coreg. She is compliant with all medications and tolerates them well.  Continue current regimen.  Renal function was stable at last visit. Will repeat labs at next follow-up.         Other   Healthcare maintenance (Chronic)    Patient due for colonoscopy based on 5 year surveillance recommended by GI given that she had 4 polyps removed.   She is also due for annual mammogram screening.   Flu shot given today.   Hep C screening done today which is negative.       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 She was previously on chronic opioid therapy, but stopped taking them a few years ago because she did not like being dependent on them.  She is currently taking Aleve as needed. She is interested in PT, but it is extremely difficult  for her to get to office appointments with her limited mobility and lack of transportation. Will order home health services.  She has never tried Cymbalta. Will start her on 30 mg daily and if tolerating well, increase to target dose of 60 mg daily.       Relevant Medications   DULoxetine (CYMBALTA) 30 MG capsule   Other Relevant Orders   Ambulatory referral to [facility]   Need for immunization against influenza - Primary   Relevant Orders   Flu Vaccine QUAD 36+ mos IM (Completed)    Other Visit Diagnoses    Encounter for hepatitis C  screening test for low risk patient       Relevant Orders   Hepatitis C antibody (Completed)   Breast cancer screening by mammogram       Relevant Orders   MM Digital Screening   Colon cancer screening       Relevant Orders   Ambulatory referral to Gastroenterology      Meds ordered this encounter  Medications  . DULoxetine (CYMBALTA) 30 MG capsule    Sig: Take 1 capsule (30 mg total) by mouth daily.    Dispense:  30 capsule    Refill:  2    Follow-up: Return in about 6 months (around 09/28/2020).    Delice Bison, DO

## 2020-03-31 NOTE — Assessment & Plan Note (Signed)
Patient due for colonoscopy based on 5 year surveillance recommended by GI given that she had 4 polyps removed.   She is also due for annual mammogram screening.   Flu shot given today.   Hep C screening done today which is negative.

## 2020-04-09 ENCOUNTER — Ambulatory Visit (INDEPENDENT_AMBULATORY_CARE_PROVIDER_SITE_OTHER): Payer: Medicare Other | Admitting: Student

## 2020-04-09 ENCOUNTER — Telehealth: Payer: Self-pay | Admitting: *Deleted

## 2020-04-09 ENCOUNTER — Other Ambulatory Visit: Payer: Self-pay

## 2020-04-09 DIAGNOSIS — R5083 Postvaccination fever: Secondary | ICD-10-CM | POA: Diagnosis not present

## 2020-04-09 NOTE — Progress Notes (Signed)
   CC: Fever  This is a telephone encounter between Alexis Kline and [name] on 04/09/2020 for fever, body aches, cough, and congestion. The visit was conducted with the patient located at home and [name] at [facility]. The patient's identity was confirmed using their DOB and current address. The patient has consented to being evaluated through a telephone encounter and understands the associated risks (an examination cannot be done and the patient may need to come in for an appointment) / benefits (allows the patient to remain at home, decreasing exposure to coronavirus). I personally spent 15 minutes on medical discussion.   HPI:  [name] is a 74 y.o. with PMH as below.   Please see A&P for assessment of the patient's acute and chronic medical conditions.   Past Medical History:  Diagnosis Date  . APPENDECTOMY, HX OF 07/10/2006   Qualifier: Diagnosis of  By: Oretha Ellis    . CAD (coronary artery disease) 2007   nonobstructive, 30% LAD 02/2006  . COPD (chronic obstructive pulmonary disease) ([place])   . 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 GERD (gastroesophageal reflux disease)   . Hemorrhoids   . HLD (hyperlipidemia)   . Hypertension   . HYSTERECTOMY, HX OF 07/10/2006   Qualifier: Diagnosis of  By: Oretha Ellis    . Myocardial infarct, old    Review of Systems:  Negative as per HPI    Assessment & Plan:   See Encounters Tab for problem based charting.  Patient seen with Dr. Evette Doffing

## 2020-04-09 NOTE — Telephone Encounter (Addendum)
Received message from Nurse Tech to contact patient for concerning symptoms. Call placed to patient. States she received her flu vaccine on 03/30/2020 and Covid booster on 04/03/2020. Since 04/04/2020 patient has had a fever 99.8-101, H/A, muscle aches, fatigue, no appetite (forcing herself to eat), cough productive of clear-green sputum, SHOB with ambulation but states this is her usual. Intermittent diarrhea but states this is also her usual.  Denies N/V. States she is drinking plenty of fluids. Patient given tele appt today with [facility] team. Hubbard Hartshorn, BSN, RN-BC

## 2020-04-10 ENCOUNTER — Encounter: Payer: Self-pay | Admitting: Student

## 2020-04-10 DIAGNOSIS — R509 Fever, unspecified: Secondary | ICD-10-CM

## 2020-04-10 HISTORY — DX: Fever, unspecified: R50.9

## 2020-04-10 NOTE — Progress Notes (Signed)
Internal Medicine Clinic Attending  I spoke with the patient by phone.  I personally confirmed the key portions of the history documented by Dr. Lisabeth Devoid and I reviewed pertinent patient test results.  The assessment, diagnosis, and plan were formulated together and I agree with the documentation in the resident's note.

## 2020-04-10 NOTE — Assessment & Plan Note (Addendum)
Patient presents for 6 days of body aches, cough producing green sputum and fever up to 101 yesterday. Patient states she started to have symptoms after having her COVID booster on 04/03/2020. States she had some mild symptoms after first 2 COVID vaccines but only lasting 2-3 days. She also recnetly had her flu vaccine on 03/30/2020 and is concerned that having both done so closely together might have caused her to feel poorly for a longer period. State she has been drinking plenty of fluids and as low appetite but has been able to keep food down. She has been using aleve and tylenol which helps with body aches and headache. She denies SOB, dizziness, nausea, vomiting, dirrhea, loss of taste or smell, or syncope. She has not been tested for COVID since this started. Advised that she be tested for COVID as this could be a breakthrough infection vs other viral URI. Encouraged her to continue to drink plenty of fluids, eat regularly, and get plenty of rest. She is agreeable to getting COVID test and calling back with results.  Plan COVID test Symptomatic management with ibuprofen and tylenol, encourage PO intake and resting.

## 2020-04-13 ENCOUNTER — Encounter: Payer: Self-pay | Admitting: *Deleted

## 2020-04-14 NOTE — Addendum Note (Signed)
Addended by: [name] on: 04/14/2020 01:15 PM   Modules accepted: Level of Service

## 2020-04-15 ENCOUNTER — Telehealth: Payer: Self-pay

## 2020-04-15 NOTE — Telephone Encounter (Signed)
Return call to [facility] - she will call back.

## 2020-04-15 NOTE — Telephone Encounter (Signed)
Stanton Kidney from [facility] [phone]  Has been unable to start home health service, pt is sick with over 100 temp, cough and sob   Stanton Kidney has advice pt to go to the ed, pt refuse to go to ed.  Pls contact Stanton Kidney [phone]

## 2020-04-15 NOTE — Telephone Encounter (Signed)
Return call from [facility]- stated she did a phone call; pt sounds very sob, coughing, temp 101. Refused to go to the ED or call EMS to be evaluate. Informed at last telehealth call, pt was instructed to get tested for covid. Stanton Kidney stated pt told her she has an at home kit but her computer was not working. So when [facility] called back to f/u; pt stated her computer is working now but has to wait on [employer]ex and unsure how long it will take to receive result. Karma Ganja I will call pt.   I called the pt - she stated her temp is down to 97.6.She stated she does not need to go to the Ed nor have EMS to come to eval her; "I just need to rest". Stated her computer is working now but she needs to register the at home kit on line. She's taking pain pills for h/a's and gargling salt/water for cough; coughing up phlegm. Instructed again to go to ED or call EMS if symptoms worsen; she agreed.

## 2020-04-15 NOTE — Telephone Encounter (Signed)
Thank you Alexis Kline, I agree with your recommendations! Can we call again Friday to see how she is doing?

## 2020-04-17 NOTE — Telephone Encounter (Signed)
F/U call -states "I feel a bit better". States no fever, coughing has decreased, she's resting and drinking plenty of fluids ( 2-4 bottles of water a day)  States she unable to do home covid test until Monday d/t the w/e coming so she has to wait.

## 2020-04-22 ENCOUNTER — Telehealth: Payer: Self-pay

## 2020-04-22 NOTE — Telephone Encounter (Signed)
ERROR

## 2020-04-27 DIAGNOSIS — Z719 Counseling, unspecified: Secondary | ICD-10-CM | POA: Diagnosis not present

## 2020-05-01 ENCOUNTER — Telehealth: Payer: Self-pay | Admitting: *Deleted

## 2020-05-01 ENCOUNTER — Telehealth: Payer: Self-pay

## 2020-05-01 DIAGNOSIS — K582 Mixed irritable bowel syndrome: Secondary | ICD-10-CM

## 2020-05-01 NOTE — Telephone Encounter (Signed)
Completed and signed Request for Independent Assessment for [place] of Medical Need faxed to [place] at [phone] on 04/28/2020. Refaxed today as it was missing an initial in the MCD ID #. Hubbard Hartshorn, BSN, RN-BC

## 2020-05-01 NOTE — Telephone Encounter (Signed)
Pls contact pt [phone]

## 2020-05-01 NOTE — Telephone Encounter (Signed)
This colonoscopy is a 5 yr surveillance f/u for history of polypectomy as advised by her gastroenterologist.  My advice would be for her to reach out to the GI practice with her concern, to determine if another test would suffice for this f/u.  It could be that they would arrange for a flex sig or a stool test.  Quite an unfortunate situation.

## 2020-05-01 NOTE — Telephone Encounter (Signed)
Individuals do have to have a responsible party present as I understand.

## 2020-05-01 NOTE — Telephone Encounter (Signed)
CCM BSW has received referrals for this specific request in the past.  Resources have been researched extensively and, unfortunately, there is no resource available to accompany patients to colonoscopy.     Ronn Melena, [place] Coordination Social Worker [place] [phone]

## 2020-05-01 NOTE — Telephone Encounter (Signed)
Pt calls and states she does not have anyone to go with her to have colonoscopy, she is upset and wants to know if the clinic can help. Will do referral to [facility].

## 2020-05-01 NOTE — Telephone Encounter (Signed)
We are in the process of obtaining PCS services for this patient to assist with transportation to her colonoscopy. We discussed this with her last week. Hubbard Hartshorn, BSN, RN-BC

## 2020-05-08 ENCOUNTER — Telehealth: Payer: Self-pay

## 2020-05-20 ENCOUNTER — Other Ambulatory Visit: Payer: Self-pay | Admitting: Internal Medicine

## 2020-06-18 ENCOUNTER — Other Ambulatory Visit: Payer: Self-pay | Admitting: Cardiovascular Disease

## 2020-06-25 ENCOUNTER — Ambulatory Visit: Payer: Medicare Other

## 2020-06-30 ENCOUNTER — Other Ambulatory Visit: Payer: Self-pay

## 2020-06-30 ENCOUNTER — Ambulatory Visit (AMBULATORY_SURGERY_CENTER): Payer: Medicare Other | Admitting: *Deleted

## 2020-06-30 VITALS — Ht 61.0 in | Wt 162.3 lb

## 2020-06-30 DIAGNOSIS — Z86010 Personal history of colonic polyps: Secondary | ICD-10-CM

## 2020-06-30 NOTE — Progress Notes (Signed)
Pt verified name, DOB, address and insurance during PV today. Pt mailed instruction packet to included paper to complete and mail back to [facility] with addressed and stamped envelope, Emmi video, copy of consent form to read and not return, and instructions.  PV completed over the phone. Pt encouraged to call with questions or issues - My Chart instructions to pt as well   No egg or soy allergy known to patient  No issues with past sedation with any surgeries or procedures No intubation problems in the past  No FH of Malignant Hyperthermia No diet pills per patient No home 02 use per patient  No blood thinners per patient  Pt denies issues with constipation  No A fib or A flutter  EMMI video to pt or via [place] 19 guidelines implemented in PV today with Pt and RN  Pt is fully vaccinated  for Covid    Due to the COVID-19 pandemic we are asking patients to follow certain guidelines.  Pt aware of COVID protocols and [facility] guidelines

## 2020-07-08 ENCOUNTER — Telehealth: Payer: Self-pay | Admitting: Gastroenterology

## 2020-07-08 NOTE — Telephone Encounter (Signed)
Inbound call from patient stating she has been running a fever of 100.1.  Wants to know if she should keep her procedure scheduled for 07/14/2020.  Please advise.

## 2020-07-08 NOTE — Telephone Encounter (Signed)
Per patient, Running a fever, chills , and headache started 3 days ago. Patient request to be rescheduled.   Rescheduled patient for 2/28 and sent new prep instructions via mychart-pt is aware. I also encouraged the patient to call her PCP TODAY about her symptoms-she states she will.

## 2020-07-14 ENCOUNTER — Encounter: Payer: Medicare Other | Admitting: Gastroenterology

## 2020-07-22 ENCOUNTER — Telehealth: Payer: Self-pay | Admitting: *Deleted

## 2020-07-22 NOTE — Telephone Encounter (Signed)
Agreed. She does not need any more COVID shots at this time. Thank you!

## 2020-07-22 NOTE — Telephone Encounter (Signed)
Appointment Request From: [name]  With Provider: Delice Bison, DO [facility]]  Preferred Date Range: Any date 07/21/2020 or later  Preferred Times: Any  Reason: To address the following health maintenance concerns. Covid-19 Vaccine  Comments: I had both Pfizer Covid-19 shots on 08-03-2019, o3-03-2020, and the Booster on 04-02-2020. Am I supposed to get more?  Called pt - stated she keeps receiving messages from [facility]. First 2 shots have been charted; I charted the booster. Informed this should take care of her concern.

## 2020-07-30 ENCOUNTER — Other Ambulatory Visit: Payer: Self-pay | Admitting: Cardiovascular Disease

## 2020-07-30 ENCOUNTER — Other Ambulatory Visit: Payer: Self-pay | Admitting: Adult Health

## 2020-07-30 ENCOUNTER — Telehealth: Payer: Self-pay | Admitting: Cardiovascular Disease

## 2020-07-30 DIAGNOSIS — I10 Essential (primary) hypertension: Secondary | ICD-10-CM

## 2020-07-30 NOTE — Telephone Encounter (Signed)
   *  STAT* If patient is at the pharmacy, call can be transferred to refill team.   1. Which medications need to be refilled? (please list name of each medication and dose if known)   hydrochlorothiazide (HYDRODIURIL) 25 MG tablet    2. Which pharmacy/location (including street and city if local pharmacy) is medication to be sent to?[place], [place] [address]  3. Do they need a 30 day or 90 day supply? 90 days   Pt requested to get 90 days supply, she said it is cheaper for her to get it for 90 days

## 2020-08-17 ENCOUNTER — Other Ambulatory Visit: Payer: Self-pay

## 2020-08-17 ENCOUNTER — Ambulatory Visit (AMBULATORY_SURGERY_CENTER): Payer: Medicare Other | Admitting: Gastroenterology

## 2020-08-17 ENCOUNTER — Encounter: Payer: Self-pay | Admitting: Gastroenterology

## 2020-08-17 VITALS — BP 141/91 | HR 63 | Temp 96.8°F | Resp 19 | Ht 61.0 in | Wt 162.0 lb

## 2020-08-17 DIAGNOSIS — D123 Benign neoplasm of transverse colon: Secondary | ICD-10-CM | POA: Diagnosis not present

## 2020-08-17 DIAGNOSIS — D122 Benign neoplasm of ascending colon: Secondary | ICD-10-CM | POA: Diagnosis not present

## 2020-08-17 DIAGNOSIS — D125 Benign neoplasm of sigmoid colon: Secondary | ICD-10-CM | POA: Diagnosis not present

## 2020-08-17 DIAGNOSIS — Z86010 Personal history of colonic polyps: Secondary | ICD-10-CM | POA: Diagnosis not present

## 2020-08-17 HISTORY — PX: COLONOSCOPY: SHX174

## 2020-08-17 MED ORDER — SODIUM CHLORIDE 0.9 % IV SOLN
500.0000 mL | Freq: Once | INTRAVENOUS | Status: DC
Start: 2020-08-17 — End: 2020-08-17

## 2020-08-17 NOTE — Progress Notes (Signed)
PT taken to PACU. Monitors in place. VSS. Report given to RN. 

## 2020-08-17 NOTE — Op Note (Signed)
[place] Patient Name: Alexis Kline Procedure Date: 08/17/2020 2:20 PM MRN: [id number] Endoscopist: Milus Banister , MD Age: 75 Referring MD:  Date of Birth: 04-02-1946 Gender: Female Account #: [id number] Procedure:                Colonoscopy Indications:              High risk colon cancer surveillance: Personal                            history of colonic polyps; colonoscopy 2016 three                            subCM adenomas, two of them were adenomas Medicines:                Monitored Anesthesia Care Procedure:                Pre-Anesthesia Assessment:                           - Prior to the procedure, a History and Physical                            was performed, and patient medications and                            allergies were reviewed. The patient's tolerance of                            previous anesthesia was also reviewed. The risks                            and benefits of the procedure and the sedation                            options and risks were discussed with the patient.                            All questions were answered, and informed consent                            was obtained. Prior Anticoagulants: The patient has                            taken no previous anticoagulant or antiplatelet                            agents. ASA Grade Assessment: II - A patient with                            mild systemic disease. After reviewing the risks                            and benefits, the patient was deemed in  satisfactory condition to undergo the procedure.                           After obtaining informed consent, the colonoscope                            was passed under direct vision. Throughout the                            procedure, the patient's blood pressure, pulse, and                            oxygen saturations were monitored continuously. The                            Olympus [id number]  [id number]) [id number] was introduced                            through the anus and advanced to the the cecum,                            identified by appendiceal orifice and ileocecal                            valve. The colonoscopy was performed without                            difficulty. The patient tolerated the procedure                            well. The quality of the bowel preparation was                            good. The ileocecal valve, appendiceal orifice, and                            rectum were photographed. Scope In: 2:28:50 PM Scope Out: 2:43:48 PM Scope Withdrawal Time: 0 hours 10 minutes 36 seconds  Total Procedure Duration: 0 hours 14 minutes 58 seconds  Findings:                 Three sessile polyps were found in the sigmoid                            colon, transverse colon and cecum. The polyps were                            2 to 4 mm in size. These polyps were removed with a                            cold snare. Resection and retrieval were complete.                           Multiple small and large-mouthed diverticula were  found in the left colon.                           The exam was otherwise without abnormality on                            direct and retroflexion views. Complications:            No immediate complications. Estimated blood loss:                            None. Estimated Blood Loss:     Estimated blood loss: none. Impression:               - Three 2 to 4 mm polyps in the sigmoid colon, in                            the transverse colon and in the cecum, removed with                            a cold snare. Resected and retrieved.                           - Diverticulosis in the left colon.                           - The examination was otherwise normal on direct                            and retroflexion views. Recommendation:           - Patient has a contact number available for                             emergencies. The signs and symptoms of potential                            delayed complications were discussed with the                            patient. Return to normal activities tomorrow.                            Written discharge instructions were provided to the                            patient.                           - Resume previous diet.                           - Continue present medications.                           - Await pathology results. Milus Banister, MD 08/17/2020 2:46:49 PM This report has  been signed electronically.

## 2020-08-17 NOTE — Progress Notes (Signed)
Called to room to assist during endoscopic procedure.  Patient ID and intended procedure confirmed with present staff. Received instructions for my participation in the procedure from the performing physician.  

## 2020-08-17 NOTE — Patient Instructions (Signed)
Handouts provided on polyps and diverticulosis.  ? ?YOU HAD AN ENDOSCOPIC PROCEDURE TODAY AT THE [facility]:   Refer to the procedure report that was given to you for any specific questions about what was found during the examination.  If the procedure report does not answer your questions, please call your gastroenterologist to clarify.  If you requested that your care partner not be given the details of your procedure findings, then the procedure report has been included in a sealed envelope for you to review at your convenience later. ? ?YOU SHOULD EXPECT: Some feelings of bloating in the abdomen. Passage of more gas than usual.  Walking can help get rid of the air that was put into your GI tract during the procedure and reduce the bloating. If you had a lower endoscopy (such as a colonoscopy or flexible sigmoidoscopy) you may notice spotting of blood in your stool or on the toilet paper. If you underwent a bowel prep for your procedure, you may not have a normal bowel movement for a few days. ? ?Please Note:  You might notice some irritation and congestion in your nose or some drainage.  This is from the oxygen used during your procedure.  There is no need for concern and it should clear up in a day or so. ? ?SYMPTOMS TO REPORT IMMEDIATELY: ? ?Following lower endoscopy (colonoscopy or flexible sigmoidoscopy): ? Excessive amounts of blood in the stool ? Significant tenderness or worsening of abdominal pains ? Swelling of the abdomen that is new, acute ? Fever of 100?F or higher ? ?For urgent or emergent issues, a gastroenterologist can be reached at any hour by calling [phone]. ?Do not use MyChart messaging for urgent concerns.  ? ? ?DIET:  We do recommend a small meal at first, but then you may proceed to your regular diet.  Drink plenty of fluids but you should avoid alcoholic beverages for 24 hours. ? ?ACTIVITY:  You should plan to take it easy for the rest of today and you should NOT  DRIVE or use heavy machinery until tomorrow (because of the sedation medicines used during the test).   ? ?FOLLOW UP: ?Our staff will call the number listed on your records 48-72 hours following your procedure to check on you and address any questions or concerns that you may have regarding the information given to you following your procedure. If we do not reach you, we will leave a message.  We will attempt to reach you two times.  During this call, we will ask if you have developed any symptoms of COVID 19. If you develop any symptoms (ie: fever, flu-like symptoms, shortness of breath, cough etc.) before then, please call [phone].  If you test positive for Covid 19 in the 2 weeks post procedure, please call and report this information to us.   ? ?If any biopsies were taken you will be contacted by phone or by letter within the next 1-3 weeks.  Please call us at [phone] if you have not heard about the biopsies in 3 weeks.  ? ? ?SIGNATURES/CONFIDENTIALITY: ?You and/or your care partner have signed paperwork which will be entered into your electronic medical record.  These signatures attest to the fact that that the information above on your After Visit Summary has been reviewed and is understood.  Full responsibility of the confidentiality of this discharge information lies with you and/or your care-partner. ? ?

## 2020-08-17 NOTE — Progress Notes (Signed)
Pt's states no medical or surgical changes since previsit or office visit.  [place] 

## 2020-08-19 ENCOUNTER — Telehealth: Payer: Self-pay

## 2020-08-19 NOTE — Telephone Encounter (Signed)
LVM

## 2020-08-23 ENCOUNTER — Other Ambulatory Visit: Payer: Self-pay | Admitting: Cardiovascular Disease

## 2020-08-23 ENCOUNTER — Other Ambulatory Visit: Payer: Self-pay | Admitting: Student

## 2020-08-23 DIAGNOSIS — I10 Essential (primary) hypertension: Secondary | ICD-10-CM

## 2020-08-27 ENCOUNTER — Encounter: Payer: Self-pay | Admitting: Gastroenterology

## 2020-09-17 ENCOUNTER — Encounter: Payer: Self-pay | Admitting: *Deleted

## 2020-09-17 NOTE — Progress Notes (Unsigned)

## 2020-09-18 ENCOUNTER — Ambulatory Visit: Payer: Medicare Other | Admitting: Cardiovascular Disease

## 2020-09-21 ENCOUNTER — Other Ambulatory Visit: Payer: Self-pay | Admitting: Cardiovascular Disease

## 2020-09-30 NOTE — Progress Notes (Unsigned)
Things That May Be Affecting Your Health:  Alcohol  Hearing loss  Pain    Depression  Home Safety  Sexual Health   Diabetes + Lack of physical activity  Stress   Difficulty with daily activities  Loneliness  Tiredness   Drug use  Medicines  Tobacco use   Falls  Motor Vehicle Safety  Weight   Food choices  Oral Health  Other    YOUR PERSONALIZED HEALTH PLAN : 1. Schedule your next subsequent Medicare Wellness visit in one year 2. Attend all of your regular appointments to address your medical issues 3. Complete the preventative screenings and services   Annual Wellness Visit   Medicare Covered Preventative Screenings and [place] Men and Women Who How Often Need? Date of Last Service Action  Abdominal Aortic Aneurysm Adults with AAA risk factors Once      Alcohol Misuse and Counseling All Adults Screening once a year if no alcohol misuse. Counseling up to 4 face to face sessions.     Bone Density Measurement  Adults at risk for osteoporosis Once every 2 yrs +     Lipid Panel Z13.6 All adults without CV disease Once every 5 yrs       Colorectal Cancer   Stool sample or  Colonoscopy All adults 24 and older   Once every year  Every 10 years        Depression All Adults Once a year  Today   Diabetes Screening Blood glucose, post glucose load, or GTT Z13.1  All adults at risk  Pre-diabetics  Once per year  Twice per year      Diabetes  Self-Management Training All adults Diabetics 10 hrs first year; 2 hours subsequent years. Requires Copay     Glaucoma  Diabetics  Family history of glaucoma  African Americans 4 yrs +  Hispanic Americans 30 yrs + Annually - requires coppay      Hepatitis C Z72.89 or F19.20  High Risk for HCV  Born between 1945 and 1965  Annually  Once      HIV Z11.4 All adults based on risk  Annually btw ages 77 & 63 regardless of risk  Annually > 65 yrs if at increased risk      Lung Cancer Screening  Asymptomatic adults aged 2-77 with 30 pack yr history and current smoker OR quit within the last 15 yrs Annually Must have counseling and shared decision making documentation before first screen +     Medical Nutrition Therapy Adults with   Diabetes  Renal disease  Kidney transplant within past 3 yrs 3 hours first year; 2 hours subsequent years     Obesity and Counseling All adults Screening once a year Counseling if BMI 30 or higher  Today   Tobacco Use Counseling Adults who use tobacco  Up to 8 visits in one year     Vaccines Z23  Hepatitis B  Influenza   Pneumonia  Adults   Once  Once every flu season  Two different vaccines separated by one year     Next Annual Wellness Visit People with Medicare Every year  Today     Services & Screenings Women Who How Often Need  Date of Last Service Action  Mammogram  Z12.31 Women over 18 One baseline ages 65-39. Annually ager 40 yrs+ +     Pap tests All women Annually if high risk. Every 2 yrs for normal risk women  Screening for cervical cancer with   Pap (Z01.419 nl or Z01.411abnl) &  HPV Z11.51 Women aged 41 to 78 Once every 5 yrs     Screening pelvic and breast exams All women Annually if high risk. Every 2 yrs for normal risk women     Sexually Transmitted Diseases  Chlamydia  Gonorrhea  Syphilis All at risk adults Annually for non pregnant females at increased risk         [place] Men Who How Ofter Need  Date of Last Service Action  Prostate Cancer - DRE & PSA Men over 50 Annually.  DRE might require a copay.        Sexually Transmitted Diseases  Syphilis All at risk adults Annually for men at increased risk      Health Maintenance List Health Maintenance  Topic Date Due  . INFLUENZA VACCINE  01/18/2021  . MAMMOGRAM  04/07/2021  . TETANUS/TDAP  11/15/2021  . COLONOSCOPY (Pts 45-64yrs Insurance coverage will need to be confirmed)  08/18/2023  . DEXA SCAN  Completed  . COVID-19  Vaccine  Completed  . Hepatitis C Screening  Completed  . PNA vac Low Risk Adult  Completed  . HPV VACCINES  Aged Out

## 2020-10-23 ENCOUNTER — Other Ambulatory Visit: Payer: Self-pay

## 2020-10-23 ENCOUNTER — Other Ambulatory Visit: Payer: Self-pay | Admitting: Cardiovascular Disease

## 2020-10-23 ENCOUNTER — Ambulatory Visit (INDEPENDENT_AMBULATORY_CARE_PROVIDER_SITE_OTHER): Payer: Medicare Other | Admitting: Internal Medicine

## 2020-10-23 ENCOUNTER — Encounter: Payer: Self-pay | Admitting: Internal Medicine

## 2020-10-23 DIAGNOSIS — R1032 Left lower quadrant pain: Secondary | ICD-10-CM | POA: Diagnosis not present

## 2020-10-23 MED ORDER — SENNOSIDES-DOCUSATE SODIUM 8.6-50 MG PO TABS: 2.0000 | ORAL_TABLET | Freq: Two times a day (BID) | ORAL | 2 refills | Status: AC

## 2020-10-23 NOTE — Progress Notes (Signed)
   CC: abdominal pain  HPI:[name] is a 75 y.o. female who has past medical history of IBS, GERD, and PUD who presents for evaluation of abdominal pain. Please see individual problem based A/P for details.  Depression, PHQ-9: Based on the patients  [place] Visit from 10/23/2020 in [place]  PHQ-9 Total Score 6     score we have does not suggest depression  Past Medical History:  Diagnosis Date  . APPENDECTOMY, HX OF 07/10/2006   Qualifier: Diagnosis of  By: Oretha Ellis    . Arthritis    back, hands, legs , feet   . CAD (coronary artery disease) 2007   nonobstructive, 30% LAD 02/2006  . COPD (chronic obstructive pulmonary disease) ([place])   . 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 GERD (gastroesophageal reflux disease)    meds helps   . Hemorrhoids   . HLD (hyperlipidemia)   . Hypertension   . HYSTERECTOMY, HX OF 07/10/2006   Qualifier: Diagnosis of  By: Oretha Ellis    . Myocardial infarct, old   . Stable angina (HCC)    chronic   . Stroke [facility])    many years ago ~ 30 yrs per pt    Review of Systems:   Review of Systems  Constitutional: Negative for fever.  Gastrointestinal: Positive for abdominal pain and constipation. Negative for blood in stool, diarrhea, melena, nausea and vomiting.     Physical Exam: Vitals:   10/23/20 1018  BP: 118/68  Pulse: 65  Temp: 97.9 F (36.6 C)  TempSrc: Oral  SpO2: 97%  Weight: 164 lb 11.2 oz (74.7 kg)     General: Elderly women, well kept using a cane to walk. NAD HEENT: Conjunctiva nl , antiicteric sclerae, moist mucous membranes, no exudate or erythema Cardiovascular: Normal rate, regular rhythm.  No murmurs, rubs, or gallops Pulmonary : Equal breath sounds, No wheezes, rales, or rhonchi Abdominal: active bowel sounds in all 4 quadrants. soft, tender in left lower quadrant.  No rebound. No hepatosplenomegaly. Ext: No edema in lower  extremities, no tenderness to palpation of lower extremities.   Assessment & Plan:   See Encounters Tab for problem based charting.  Patient discussed with Dr. Jimmye Norman

## 2020-10-23 NOTE — Patient Instructions (Addendum)
Thank you for trusting me with your care. To recap, today we discussed the following:   1. Left lower quadrant abdominal pain I will better evaluate your abdominal pain with the test I have ordered. While we are getting these test we can start treating your constipation. We need to establish a bowel regimen which allows you to have bowel movements without straining.  - CT ABDOMEN PELVIS W CONTRAST; Future - CBC with Diff - BMP8+Anion Gap - senna-docusate (SENNALAX-S) 8.6-50 MG tablet; Take 2 tablets by mouth 2 (two) times daily.  Dispense: 120 tablet; Refill: 2 - You can also use miralax twice daily. - Continue fiber supplement and follow general instructions below for constipation.    Constipation, Adult Constipation is when a person has trouble pooping (having a bowel movement). When you have this condition, you may poop fewer than 3 times a week. Your poop (stool) may also be dry, hard, or bigger than normal. Follow these instructions at home: Eating and drinking  Eat foods that have a lot of fiber, such as: ? Fresh fruits and vegetables. ? Whole grains. ? Beans.  Eat less of foods that are low in fiber and high in fat and sugar, such as: ? [place] fries. ? Hamburgers. ? Cookies. ? Candy. ? Soda.  Drink enough fluid to keep your pee (urine) pale yellow.    General instructions  Exercise regularly or as told by your doctor. Try to do 150 minutes of exercise each week.  Go to the restroom when you feel like you need to poop. Do not hold it in.  Take over-the-counter and prescription medicines only as told by your doctor. These include any fiber supplements.  When you poop: ? Do deep breathing while relaxing your lower belly (abdomen). ? Relax your pelvic floor. The pelvic floor is a group of muscles that support the rectum, bladder, and intestines (as well as the uterus in women).  Watch your condition for any changes. Tell your doctor if you notice any.  Keep all  follow-up visits as told by your doctor. This is important. Contact a doctor if:  You have pain that gets worse.  You have a fever.  You have not pooped for 4 days.  You vomit.  You are not hungry.  You lose weight.  You are bleeding from the opening of the butt (anus).  You have thin, pencil-like poop. Get help right away if:  You have a fever, and your symptoms suddenly get worse.  You leak poop or have blood in your poop.  Your belly feels hard or bigger than normal (bloated).  You have very bad belly pain.  You feel dizzy or you faint. Summary  Constipation is when a person poops fewer than 3 times a week, has trouble pooping, or has poop that is dry, hard, or bigger than normal.  Eat foods that have a lot of fiber.  Drink enough fluid to keep your pee (urine) pale yellow.  Take over-the-counter and prescription medicines only as told by your doctor. These include any fiber supplements. This information is not intended to replace advice given to you by your health care provider. Make sure you discuss any questions you have with your health care provider. Document Revised: 04/24/2019 Document Reviewed: 04/24/2019 Elsevier Patient Education  [place].

## 2020-10-23 NOTE — Assessment & Plan Note (Signed)
Pain started February 28th after colonoscopy. Colonoscopy showed:Three 2 to 4 mm polyps in the sigmoid colon, in the transverse colon and in the cecum,removed with a cold snare. Path results nl. Resected and retrieved.Diverticulosis in the left colon. The examination was otherwise normal on direct and retroflexion views.  It is located on the lower right side. It has been 8/10. She has been constipated. The pain has not ever went away, but does ease up at time. She hasn't noticed anything to make it better or anything made it worse.  Patient has IBS-C and reports hard stools.   Assessment: Exam and history inconclusive, given on going pain for 3 months will send for CT abdomen and pelvis.  Constipation and diverticulitis on the differential. Think perforation is not likely given no blood in stool , soft bowel, and timing. Diverticulitis is less likely given she mostly has constipation, fever, and reports only one episode of loose bowels one month ago.  Plan: - CT ABDOMEN PELVIS W CONTRAST; Future - CBC with Diff - BMP8+Anion Gap - senna-docusate (SENNALAX-S) 8.6-50 MG tablet; Take 2 tablets by mouth 2 (two) times daily.  Dispense: 120 tablet; Refill: 2

## 2020-10-24 LAB — BMP8+ANION GAP
Anion Gap: 19 mmol/L — ABNORMAL HIGH (ref 10.0–18.0)
BUN/Creatinine Ratio: 29 — ABNORMAL HIGH (ref 12–28)
BUN: 23 mg/dL (ref 8–27)
CO2: 22 mmol/L (ref 20–29)
Calcium: 9.9 mg/dL (ref 8.7–10.3)
Chloride: 100 mmol/L (ref 96–106)
Creatinine, Ser: 0.78 mg/dL (ref 0.57–1.00)
Glucose: 80 mg/dL (ref 65–99)
Potassium: 4 mmol/L (ref 3.5–5.2)
Sodium: 141 mmol/L (ref 134–144)
eGFR: 80 mL/min/{1.73_m2} (ref >59)

## 2020-10-24 LAB — CBC WITH DIFFERENTIAL/PLATELET
Basophils Absolute: 0.1 10*3/uL (ref 0.0–0.2)
Basos: 1 %
EOS (ABSOLUTE): 0.2 10*3/uL (ref 0.0–0.4)
Eos: 2 %
Hematocrit: 40.5 % (ref 34.0–46.6)
Hemoglobin: 12.8 g/dL (ref 11.1–15.9)
Immature Grans (Abs): 0 10*3/uL (ref 0.0–0.1)
Immature Granulocytes: 0 %
Lymphocytes Absolute: 1.7 10*3/uL (ref 0.7–3.1)
Lymphs: 18 %
MCH: 25.4 pg — ABNORMAL LOW (ref 26.6–33.0)
MCHC: 31.6 g/dL (ref 31.5–35.7)
MCV: 80 fL (ref 79–97)
Monocytes Absolute: 0.6 10*3/uL (ref 0.1–0.9)
Monocytes: 7 %
Neutrophils Absolute: 6.6 10*3/uL (ref 1.4–7.0)
Neutrophils: 72 %
Platelets: 303 10*3/uL (ref 150–450)
RBC: 5.04 x10E6/uL (ref 3.77–5.28)
RDW: 14.4 % (ref 11.7–15.4)
WBC: 9.2 10*3/uL (ref 3.4–10.8)

## 2020-10-28 NOTE — Progress Notes (Signed)
Internal Medicine Clinic Attending  Case discussed with Dr. Steen  At the time of the visit.  We reviewed the resident's history and exam and pertinent patient test results.  I agree with the assessment, diagnosis, and plan of care documented in the resident's note.  

## 2020-10-28 NOTE — Progress Notes (Signed)
Patient called and updated on recent lab work. She continues to have some constipation followed by day of loose bowels. Denies ongoing severe abdominal pain like she had at her visit. She would like to continue with imaging ordered and I recommended she follow up with clinic if not contacted by tomorrow. She is going to stop using Miralax. She is going to continue metamucil and decrease use of senna if she continues to have loose bowel movements.

## 2020-11-04 ENCOUNTER — Telehealth: Payer: Self-pay | Admitting: Internal Medicine

## 2020-11-04 NOTE — Telephone Encounter (Signed)
Called patient and explained national shortage of contrast dye. She is having bowel movement and no abdominal pain. She continues to have constipation some times followed by loose bowel movements. We discussed adjusting her bowel regimen and calling for follow up in 2 weeks if she needs further evaluation.

## 2020-11-09 ENCOUNTER — Encounter: Payer: Self-pay | Admitting: Internal Medicine

## 2020-11-09 DIAGNOSIS — R1032 Left lower quadrant pain: Secondary | ICD-10-CM

## 2020-11-09 DIAGNOSIS — R1013 Epigastric pain: Secondary | ICD-10-CM

## 2020-11-10 NOTE — Telephone Encounter (Signed)
Patient continues to have abdominal bloating. CT abdomen and pelvis was ordered initially for evaluation of possible diverticulitis. This pain has resolved and order cancelled to conserve contrast. . Bowel movement have normalized with taking Senna. Patient continues to have bloating and early satiety. Will refer to GI for consideration of EGD for dyspepsia. She is a patient at [facility].

## 2020-11-24 ENCOUNTER — Other Ambulatory Visit: Payer: Self-pay | Admitting: Internal Medicine

## 2020-11-24 ENCOUNTER — Other Ambulatory Visit: Payer: Self-pay | Admitting: Cardiovascular Disease

## 2020-11-24 DIAGNOSIS — I10 Essential (primary) hypertension: Secondary | ICD-10-CM

## 2020-11-27 ENCOUNTER — Encounter: Payer: Self-pay | Admitting: *Deleted

## 2020-12-22 ENCOUNTER — Encounter: Payer: Self-pay | Admitting: *Deleted

## 2020-12-22 ENCOUNTER — Ambulatory Visit (INDEPENDENT_AMBULATORY_CARE_PROVIDER_SITE_OTHER): Payer: Medicare Other | Admitting: Cardiovascular Disease

## 2020-12-22 ENCOUNTER — Other Ambulatory Visit: Payer: Self-pay

## 2020-12-22 ENCOUNTER — Encounter: Payer: Self-pay | Admitting: Cardiovascular Disease

## 2020-12-22 DIAGNOSIS — K219 Gastro-esophageal reflux disease without esophagitis: Secondary | ICD-10-CM

## 2020-12-22 DIAGNOSIS — I2510 Atherosclerotic heart disease of native coronary artery without angina pectoris: Secondary | ICD-10-CM | POA: Diagnosis not present

## 2020-12-22 DIAGNOSIS — I10 Essential (primary) hypertension: Secondary | ICD-10-CM | POA: Diagnosis not present

## 2020-12-22 DIAGNOSIS — E782 Mixed hyperlipidemia: Secondary | ICD-10-CM | POA: Diagnosis not present

## 2020-12-22 DIAGNOSIS — Z87891 Personal history of nicotine dependence: Secondary | ICD-10-CM | POA: Diagnosis not present

## 2020-12-22 MED ORDER — CARVEDILOL 25 MG PO TABS: 25.0000 mg | ORAL_TABLET | Freq: Two times a day (BID) | ORAL | 2 refills | Status: DC

## 2020-12-22 MED ORDER — HYDROCHLOROTHIAZIDE 25 MG PO TABS: 25.0000 mg | ORAL_TABLET | Freq: Every day | ORAL | 2 refills | Status: DC

## 2020-12-22 MED ORDER — AMLODIPINE BESYLATE 5 MG PO TABS: 5.0000 mg | ORAL_TABLET | Freq: Every day | ORAL | 2 refills | Status: DC

## 2020-12-22 NOTE — Progress Notes (Signed)
Cardiology Office Note    Date:  12/23/2020   ID:  Alexis Kline, DOB 18-Jun-1946, MRN [id number]  PCP:  Champ Mungo, DO  Cardiologist:  Nicki Guadalajara, MD   F/U    History of Present Illness:  Alexis UNCAPHER is a 75 y.o. female who presents for an 24 month follow-up cardiology evaluation  [name]  has a greater than 30 year history of hypertension, a history of hyperlipidemia, as well as GERD.  She also has a history of osteopenia.  Recently, she developed a bandlike chest pressure around her upper chest without radiation that occurred at rest and lasted for 15-20 minutes with spontaneous resolution.  Her symptoms were associated with shortness of breath and she felt that she could not catch her breath.  She denies any classic exertional symptomatology.  Reportedly also has a history of chronic stable angina, and remotely had undergone cardiac catheterization in 2012 .  She was found to have 30% areas of segmental disease in her LAD.  Smoking cessation was advised.  She was treated medically.  She has a history of bilateral neck pain radiating to her shoulders and has chronic cervical of musculoskeletal discomfort from traumatic injury over 20 years ago.  She was recently evaluated by Dr. Lawerance Bach and with her recent chest pain symptomatology she is now referred for cardiology evaluation.  Of note, the patient states she drinks at least 8 cups of coffee per day.  She admits to occasional palpitations.  When I initially saw her, we discussed significant reduction in caffeine intake.  Her blood pressure was upper normal in width.  Her chest pain.  I recommended titration of carvedilol to 18.75 mg twice a day.  She underwent an echo Doppler study on 11/09/2016 which showed hyperdynamic LV function with an EF of 65-70%.  There was grade 1 diastolic dysfunction.  There was mild mitral annular calcification.  A nuclear perfusion study showed an EF of 81%.  There were no ECG changes during  stress.  She had normal perfusion.  Laboratory showed a hemoglobin of 12.7 and hematocrit of 40.8.  TSH was normal at 1.49.   I saw her in November 2018.  At that time she was under significant stress to her daughter who is 55 years old had just been diagnosed with stage IV lung cancer on April 19, 2017.  Unfortunately, her daughter died on June 20, 2017.  I saw her in September 2019 at which time she had noticed recent significant blood pressure lability.  She has not had recent laboratory.  She denies any episodes of chest tightness or significant shortness of breath.  She has been on hydrochlorothiazide 25 mg and carvedilol 25 mg twice a day for blood pressure.  She was on atorvastatin 40 mg daily and has GERD on omeprazole.   During that evaluation, her blood pressure was significantly elevated at 201/103.  Her daughter had recently passed away.  I added amlodipine 5 mg initially to her medical regimen.  She was seen on May 07, 2018 and follow-up by Zonia Kief and blood pressure was significantly improved at 118/68.   She was evaluated in a telemedicine evaluation on November 23, 2018.  Recently, her blood pressure has continued to be fairly well controlled but at times may still increase slightly.  She had noticed some occasional chest fluttering's of short duration.  She has reduced her caffeine intake.  She admits to mild swelling around her right ankle.  She denies chest  pain PND orthopnea she denies exertional dyspnea.    I last saw her on July 09, 2019 and since her prior evaluation she had felt well.  She specifically denied any chest pain or shortness of breath.  She walks with a cane.  She has continued to be on amlodipine 5 mg, carvedilol 25 mg twice a day, and hydrochlorothiazide 25 mg daily for hypertension.  She is on atorvastatin 40 mg for hyperlipidemia.  She continues to be on omeprazole for GERD.  During that evaluation her blood pressure was significantly improved.   She was still grieving the death of her daughter who unfortunately had died with stage IV lung cancer.  Since I last saw her, she has remained stable.  She continues to walk with a cane.  She denies any chest pain, significant shortness of breath or palpitations.  She quit tobacco several years ago.  She underwent colonoscopy in February 2022 and was found to have 3 polyps which were removed.  She has not had recent laboratory.  She presents for evaluation.  Past Medical History:  Diagnosis Date   APPENDECTOMY, HX OF 07/10/2006   Qualifier: Diagnosis of  By: Oretha Ellis     Arthritis    back, hands, legs , feet    CAD (coronary artery disease) 2007   nonobstructive, 30% LAD 02/2006   COPD (chronic obstructive pulmonary disease) ([place])    Fibroadenoma 2008   right breast - based on [place] 09/2006   GERD (gastroesophageal reflux disease)    meds helps    Hemorrhoids    HLD (hyperlipidemia)    Hypertension    HYSTERECTOMY, HX OF 07/10/2006   Qualifier: Diagnosis of  By: Oretha Ellis     Myocardial infarct, old    Stable angina ([place])    chronic    Stroke ([place])    many years ago ~ 30 yrs per pt     Past Surgical History:  Procedure Laterality Date   [place] EXCISIONAL BIOPSY Right 1988   BREAST LUMPECTOMY     right breat   CARPAL [place]   3 operations in right hand and 2 operations in left hand. Dr. Redmond Pulling in [place]   COLONOSCOPY  02/04/2015   [facility]   COLONOSCOPY  08/17/2020   [facility]   POLYPECTOMY     PTCA  2007   rectal stapling  2008   Done by Dr. Marlou Starks   RECTAL SURGERY  01/07/2007   Dr. Marlou St[place] in [place].    SHOULDER SURGERY  1991   Right shoulder surgery in 1991 and left shoulder surgery in 1992 by Dr. Redmond Pulling in [place].    Current Medications: Outpatient Medications Prior to Visit  Medication Sig Dispense Refill   aspirin 81 MG EC tablet Take 1 tablet  (81 mg total) by mouth daily. 90 tablet 3   Aspirin-Caffeine (BAYER BACK & BODY) 500-32.5 MG TABS Take by mouth as needed.     atorvastatin (LIPITOR) 20 MG tablet TAKE ONE TABLET BY MOUTH ONCE DAILY 30 tablet 0   B Complex-Biotin-FA (SUPER B-COMPLEX PO) Take 1 tablet by mouth daily.     Calcium Carbonate-Vit D-Min (CALTRATE 600+D PLUS MINERALS) 600-800 MG-UNIT CHEW Chew 1 tablet by mouth in the morning and at bedtime.     nitroGLYCERIN (NITROSTAT) 0.4 MG SL tablet DISSOLVE 1 TABLET UNDER TONGUE AS NEEDED FOR CHEST  PAIN,MAY REPEAT IN5 MINUTES FOR 3 DOSES. 25 tablet 1   omeprazole (PRILOSEC) 40 MG capsule TAKE ONE CAPSULE BY MOUTH TWICE DAILY 180 capsule 0   senna-docusate (SENNALAX-S) 8.6-50 MG tablet Take 2 tablets by mouth 2 (two) times daily. 120 tablet 2   amLODipine (NORVASC) 5 MG tablet TAKE ONE TABLET BY MOUTH EVERY DAY 30 tablet 0   carvedilol (COREG) 25 MG tablet TAKE 1 TABLET BY MOUTH TWICE DAILY WITH A MEAL. 60 tablet 2   hydrochlorothiazide (HYDRODIURIL) 25 MG tablet TAKE ONE TABLET BY MOUTH ONCE DAILY 30 tablet 0   No facility-administered medications prior to visit.     Allergies:   [name] isothiocyanate], Iodine, Other, and Tape   Social History   Socioeconomic History   Marital status: Widowed    Spouse name: Not on file   Number of children: Not on file   Years of education: Not on file   Highest education level: Not on file  Occupational History   Not on file  Tobacco Use   Smoking status: Former    Years: 54.00    Pack years: 0.00    Types: Cigarettes    Quit date: 06/20/2013    Years since quitting: 7.5   Smokeless tobacco: Never  Substance and Sexual Activity   Alcohol use: Yes    Alcohol/week: 0.0 standard drinks    Comment: rarely "maybe a drink of New Year's"   Drug use: No   Sexual activity: Not Currently  Other Topics Concern   Not on file  Social History Narrative   Not on file   Social Determinants of Health   Financial Resource Strain:  Not on file  Food Insecurity: Not on file  Transportation Needs: Not on file  Physical Activity: Not on file  Stress: Not on file  Social Connections: Not on file    Additional social history is notable in that she is a former truck Geophysicist/field seismologist.  She is widowed for 20 years.  She had 2 children, 4 grandchildren, and 11 great grandchildren.  She lives by herself.  She has 2 years of college.  She retired in 1970.  She previously had smoked for 55 years.  She does walk approximately 3-4 days per week.  Family History:  The patient's family history includes Diabetes in her sister; Heart disease in her sister; Hyperlipidemia in her father and mother; Hypertension in her father and mother; Leukemia in her brother; Stomach cancer in her sister.   ROS General: Negative; No fevers, chills, or night sweats;  HEENT: Negative; No changes in vision or hearing, sinus congestion, difficulty swallowing Pulmonary: Negative; No cough, wheezing, shortness of breath, hemoptysis Cardiovascular: see HPI GI: Positive for GERD GU: Negative; No dysuria, hematuria, or difficulty voiding Musculoskeletal: Chronic neck and arm pain since her remote trauma. Hematologic/Oncology: Negative; no easy bruising, bleeding Endocrine: Negative; no heat/cold intolerance; no diabetes Neuro: Negative; no changes in balance, headaches Skin: Negative; No rashes or skin lesions Psychiatric: Negative; No behavioral problems, depression Sleep: Negative; No snoring, daytime sleepiness, hypersomnolence, bruxism, restless legs, hypnogognic hallucinations, no cataplexy Other comprehensive 14 point system review is negative.   PHYSICAL EXAM:   VS:  BP 125/61   Pulse (!) 56   Ht $R'5\' 2"'le$  (1.575 m)   Wt 166 lb 12.8 oz (75.7 kg)   LMP 06/20/1986   SpO2 97%   BMI 30.51 kg/m     Repeat blood pressure by me was 116/64  Wt Readings from Last 3 Encounters:  12/22/20 166 lb 12.8 oz (75.7 kg)  10/23/20 164 lb 11.2 oz (74.7 kg)  08/17/20 162  lb (73.5 kg)     General: Alert, oriented, no distress.  Skin: normal turgor, no rashes, warm and dry HEENT: Normocephalic, atraumatic. Pupils equal round and reactive to light; sclera anicteric; extraocular muscles intact; Nose without nasal septal hypertrophy Mouth/Parynx benign; Mallinpatti scale 3 Neck: No JVD, no carotid bruits; normal carotid upstroke Lungs: clear to ausculatation and percussion; no wheezing or rales Chest wall: without tenderness to palpitation Heart: PMI not displaced, RRR, s1 s2 normal, 1/6 systolic murmur, no diastolic murmur, no rubs, gallops, thrills, or heaves Abdomen: soft, nontender; no hepatosplenomehaly, BS+; abdominal aorta nontender and not dilated by palpation. Back: no CVA tenderness Pulses 2+ Musculoskeletal: full range of motion, normal strength, no joint deformities Extremities: no clubbing cyanosis or edema, Homan's sign negative  Neurologic: grossly nonfocal; Cranial nerves grossly wnl Psychologic: Normal mood and affect   Studies/Labs Reviewed:   EKG:  EKG is ordered today. ECG (independently read by me): Sinus bradycardia 56 bpm.  Poor anterior R wave progression V1 through V4.  Normal intervals.  No ectopy.  July 08, 2018 ECG (independently read by me): Normal sinus rhythm at 61 bpm.  Poor anterior R wave progression.  September 2019 ECG (independently read by me): Normal sinus rhythm at 62 bpm.  Poor anterior R wave progression.  Normal intervals.  No ectopy.  May 17, 2017 ECG (independently read by me): Normal sinus rhythm at 74 bpm.  QS complex.  Anteroseptally  11/22/2016 ECG (independently read by me): Normal sinus rhythm at 79 bpm.  No ectopy.  Normal intervals.  No significant ST changes.  10/26/2016 ECG (independently read by me): Normal sinus rhythm at 65 bpm.  Normal intervals.  No ectopy.  No significant ST-T changes.  Recent Labs: BMP Latest Ref Rng & Units 10/23/2020 07/09/2019 03/25/2019  Glucose 65 - 99 mg/dL 80 87  83  BUN 8 - 27 mg/dL $Remove'23 17 15  'cJJFmDO$ Creatinine 0.57 - 1.00 mg/dL 0.78 0.61 0.80  BUN/Creat Ratio 12 - 28 29(H) 28 19  Sodium 134 - 144 mmol/L 141 143 141  Potassium 3.5 - 5.2 mmol/L 4.0 4.8 3.5  Chloride 96 - 106 mmol/L 100 105 103  CO2 20 - 29 mmol/L $RemoveB'22 25 24  'PGxgHmpy$ Calcium 8.7 - 10.3 mg/dL 9.9 9.6 9.7     Hepatic Function Latest Ref Rng & Units 07/09/2019 07/13/2018 03/28/2018  Total Protein 6.0 - 8.5 g/dL 6.8 6.5 6.6  Albumin 3.7 - 4.7 g/dL 4.3 4.1 4.0  AST 0 - 40 IU/L 27 45(H) 40  ALT 0 - 32 IU/L 16 32 31  Alk Phosphatase 39 - 117 IU/L 56 56 54  Total Bilirubin 0.0 - 1.2 mg/dL 0.3 0.5 0.3  Bilirubin, Direct 0.00 - 0.40 mg/dL - 0.15 -    CBC Latest Ref Rng & Units 10/23/2020 07/09/2019 03/28/2018  WBC 3.4 - 10.8 x10E3/uL 9.2 8.6 6.3  Hemoglobin 11.1 - 15.9 g/dL 12.8 12.9 13.1  Hematocrit 34.0 - 46.6 % 40.5 43.0 41.9  Platelets 150 - 450 x10E3/uL 303 261 269   Lab Results  Component Value Date   MCV 80 10/23/2020   MCV 84 07/09/2019   MCV 82 03/28/2018   Lab Results  Component Value Date   TSH 1.460 07/09/2019   No results found for: HGBA1C   BNP No results found for: BNP  ProBNP No results found for: PROBNP   Lipid Panel  Component Value Date/Time   CHOL 170 07/09/2019 0941   TRIG 141 07/09/2019 0941   HDL 46 07/09/2019 0941   CHOLHDL 3.7 07/09/2019 0941   CHOLHDL 3.8 01/01/2014 1433   VLDL 38 01/01/2014 1433   LDLCALC 99 07/09/2019 0941     RADIOLOGY: No results found.   Additional studies/ records that were reviewed today include:  I reviewed the patient's prior cardiac catheterization from 11/03/2010.  I reviewed recent office records.   I reviewed her nuclear perfusion study, as well as her echo Doppler study and  Laboratory.  ECHO 10/20/2016 Study Conclusions   - Left ventricle: The cavity size was normal. Systolic function was   vigorous. The estimated ejection fraction was in the range of 65%   to 70%. Wall motion was normal; there were no regional  wall   motion abnormalities. Doppler parameters are consistent with   abnormal left ventricular relaxation (grade 1 diastolic   dysfunction). - Mitral valve: Calcified annulus  ASSESSMENT:    1. Essential hypertension   2. Mixed hyperlipidemia   3. CAD in native artery: Mild nonobstructive, 2012   4. Prior history of tobacco abusefor 55 years   5. Gastroesophageal reflux disease without esophagitis      PLAN:  Ms. Patsey Berthold is a 75 year-old female has a history of significant tobacco abuse, having smoked for 55 years and fortunately quit smoking several years ago.  She has a greater than  30 year history of hypertension.  She was found to have mild nonobstructive CAD at cardiac catheterization in May 2012.  She has a history of hyperlipidemia.  She does not have any history of anginal symptomatology.  A  nuclear perfusion study in May 2018 showed normal perfusion with hyperdynamic ejection fraction and normal wall motion.  At a previous evaluation she was significantly hypertensive and amlodipine was added to her regimen.  Her blood pressure today is excellent on her current regimen of amlodipine 5 mg, carvedilol 25 mg twice a day, and hydrochlorothiazide 25 mg daily.  She is on atorvastatin 20 mg for hyperlipidemia with target LDL less than 70.  LDL cholesterol in January 2021 was 99.  She has not had recent laboratory.  Her GERD is controlled with omeprazole.  She recently underwent colonoscopy and was found to have 3 polyps.  She continues to walk with a cane.  I have recommended fasting laboratory with a comprehensive metabolic panel, CBC, TSH and lipid studies.  We will contact her regarding the results.  As long as she remains stable I will see her in 1 year for reevaluation.   Medication Adjustments/Labs and Tests Ordered: Current medicines are reviewed at length with the patient today.  Concerns regarding medicines are outlined above.  Medication changes, Labs and Tests ordered today are  listed in the Patient Instructions below. Patient Instructions  Medication Instructions:  No Changes *If you need a refill on your cardiac medications before your next appointment, please call your pharmacy*   Lab Work: CBC,CMP,Lipids,TSh If you have labs (blood work) drawn today and your tests are completely normal, you will receive your results only by: [place] (if you have MyChart) OR A paper copy in the mail If you have any lab test that is abnormal or we need to change your treatment, we will call you to review the results.   Testing/Procedures: No Testing   Follow-Up: At [facility], you and your health needs are our priority.  As part of our continuing mission to  provide you with exceptional heart care, we have created designated Provider Care Teams.  These Care Teams include your primary Cardiologist (physician) and Advanced Practice Providers (APPs -  Physician Assistants and Nurse Practitioners) who all work together to provide you with the care you need, when you need it.  We recommend signing up for the patient portal called "MyChart".  Sign up information is provided on this After Visit Summary.  MyChart is used to connect with patients for Virtual Visits (Telemedicine).  Patients are able to view lab/test results, encounter notes, upcoming appointments, etc.  Non-urgent messages can be sent to your provider as well.   To learn more about what you can do with MyChart, go to [email or web address].    Your next appointment:   1 year(s)  The format for your next appointment:   In Person  Provider:   Shelva Majestic, MD        Signed, Shelva Majestic, MD  12/23/2020 [place] [address] [place], [place],   [address] Phone: [phone]

## 2020-12-22 NOTE — Patient Instructions (Signed)
Medication Instructions:  No Changes *If you need a refill on your cardiac medications before your next appointment, please call your pharmacy*   Lab Work: CBC,CMP,Lipids,TSh If you have labs (blood work) drawn today and your tests are completely normal, you will receive your results only by: [place] (if you have MyChart) OR A paper copy in the mail If you have any lab test that is abnormal or we need to change your treatment, we will call you to review the results.   Testing/Procedures: No Testing   Follow-Up: At [facility], you and your health needs are our priority.  As part of our continuing mission to provide you with exceptional heart care, we have created designated Provider Care Teams.  These Care Teams include your primary Cardiologist (physician) and Advanced Practice Providers (APPs -  Physician Assistants and Nurse Practitioners) who all work together to provide you with the care you need, when you need it.  We recommend signing up for the patient portal called "MyChart".  Sign up information is provided on this After Visit Summary.  MyChart is used to connect with patients for Virtual Visits (Telemedicine).  Patients are able to view lab/test results, encounter notes, upcoming appointments, etc.  Non-urgent messages can be sent to your provider as well.   To learn more about what you can do with MyChart, go to [email or web address].    Your next appointment:   1 year(s)  The format for your next appointment:   In Person  Provider:   Shelva Majestic, MD

## 2020-12-23 ENCOUNTER — Encounter: Payer: Self-pay | Admitting: Cardiovascular Disease

## 2020-12-23 ENCOUNTER — Other Ambulatory Visit: Payer: Self-pay | Admitting: Cardiovascular Disease

## 2021-01-11 ENCOUNTER — Other Ambulatory Visit: Payer: Self-pay | Admitting: Cardiovascular Disease

## 2021-01-11 ENCOUNTER — Ambulatory Visit (INDEPENDENT_AMBULATORY_CARE_PROVIDER_SITE_OTHER): Payer: Medicare Other | Admitting: Internal Medicine

## 2021-01-11 ENCOUNTER — Other Ambulatory Visit: Payer: Self-pay

## 2021-01-11 ENCOUNTER — Encounter: Payer: Self-pay | Admitting: Internal Medicine

## 2021-01-11 VITALS — BP 126/70 | HR 59 | Temp 98.1°F | Ht 63.0 in | Wt 170.1 lb

## 2021-01-11 DIAGNOSIS — G8929 Other chronic pain: Secondary | ICD-10-CM | POA: Diagnosis not present

## 2021-01-11 DIAGNOSIS — M25562 Pain in left knee: Secondary | ICD-10-CM

## 2021-01-11 DIAGNOSIS — E785 Hyperlipidemia, unspecified: Secondary | ICD-10-CM

## 2021-01-11 DIAGNOSIS — M25561 Pain in right knee: Secondary | ICD-10-CM

## 2021-01-11 DIAGNOSIS — I10 Essential (primary) hypertension: Secondary | ICD-10-CM

## 2021-01-11 DIAGNOSIS — M810 Age-related osteoporosis without current pathological fracture: Secondary | ICD-10-CM

## 2021-01-11 MED ORDER — DICLOFENAC SODIUM 1 % EX GEL: CUTANEOUS | 3 refills | Status: DC

## 2021-01-11 NOTE — Assessment & Plan Note (Addendum)
Patient endorses bilateral knee pain that has affected her mobility. She does take bayer back and body a few times per week but states that this does not help with her knee pain. I asked her about her interest in switching to tylenol arthritis as bayer has Aspirin in it and she is taking aspirin 81 mg daily, and educated her on bleeding risk with increased NSAID consumption. She states that tylenol brings her no relief. She does endorse joint locking and subsequent falls as a result. She is able to be mobile with the assistance of her cane. - Voltaren gel as needed for pain

## 2021-01-11 NOTE — Assessment & Plan Note (Signed)
Patient has reportedly not tolerated alendronate in the past and is currently not treated for osteoporosis beyond Caltrate.  - Counseled patient on benefit of restarting secondary prevention  Medication for osteoporosis. She voiced understanding. We will discuss initiating these therapies at her follow-up visit - Vitamin D levels checked today - Continue calcium Caltrate 600 + D plus minerals

## 2021-01-11 NOTE — Assessment & Plan Note (Signed)
Patient is stable today with BP of 126/70.  - Continue amlodipine 5 mg once daily - Continue carvedilol 25 mg twice daily - Continue HCTZ 25 mg once daily

## 2021-01-11 NOTE — Patient Instructions (Addendum)
Thank you for visiting the [facility] Medicine [facility] today. It was a pleasure to meet you! Today we discussed   I have ordered the following for you:  Lab orders: Vitamin D level Lipid panel: to check your cholesterol  Medication changes: I have sent in diclofenac gel. This can be rubbed over your knees as needed for pain.  Follow-up: 3 months for check-up  Remember: If you have any questions or concerns, please call our clinic at [phone] between 9am-5pm and after hours call [phone] and ask for the internal medicine resident on call. If you feel you are having a medical emergency please call 911.  Farrel Gordon, DO

## 2021-01-11 NOTE — Assessment & Plan Note (Addendum)
Last lipid panel in January 2021 showed LDL above goal of 70 at 99. - Lipid panel drawn today - Continue Lipitor 20 mg daily. Will adjust dose if needed per lipid panel results.  Addendum 01/12/2021: Triglycerides elevated at 211, LDL still above goal at 80. New prescription for Lipitor 40 mg daily sent in.

## 2021-01-11 NOTE — Progress Notes (Signed)
CC: check up, knee pain  HPI:  [name] is a 75 y.o. female with past medical history as listed below who presents for a check-up today. She is also complaining of bilateral knee pain that is noticeable especially when walking. She states that she takes bayer back and body for her chronic back pain a few times per week and that this does not help the knee pain. She has had the knee joints lock up a few times resulting in her stumbling and falling. She ambulates with a cane. She has tried tylenol arthritis and says that she got no relief with this. She states that in general she deals with her pain until it is intolerable, at which point she will take an over the counter medication for relief.  She does endorse a dizzy spell last week and during that time found her blood pressure to be elevated at 201/110. She denies headache at that time. She says that she laid down for what she thought was half an hour but instead slept from 11:30 am until around 3:00 am. She has had spells like this in the past. She is presently normotensive.  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)    chronic    Stroke ([place])    many years ago ~ 30 yrs per pt    Review of Systems:  Review of Systems  Constitutional:  Negative for chills, fever and malaise/fatigue.  Respiratory:  Negative for shortness of breath.   Cardiovascular:  Negative for chest pain  and palpitations.  Gastrointestinal:  Negative for abdominal pain, constipation and diarrhea.  Genitourinary:  Negative for dysuria.  Musculoskeletal:  Positive for back pain, falls (knees lock up or joints catch and she falls) and joint pain (bilateral knees).  Neurological:  Positive for dizziness. Negative for loss of consciousness and headaches.    Physical Exam:  Vitals:   01/11/21 1310  BP: 126/70  Pulse: (!) 59  Temp: 98.1 F (36.7 C)  TempSrc: Oral  SpO2: 97%  Weight: 170 lb 1.6 oz (77.2 kg)  Height: '5\' 3"'$  (1.6 m)   Physical Exam Vitals and nursing note reviewed.  Constitutional:      Appearance: Normal appearance.  Cardiovascular:     Rate and Rhythm: Normal rate and regular rhythm.     Heart sounds: Normal heart sounds.  Pulmonary:     Effort: Pulmonary effort is normal.     Breath sounds: Normal breath sounds.  Musculoskeletal:     Right knee: No swelling, deformity, effusion, erythema or crepitus. No tenderness.     Left knee: No swelling, deformity, effusion, erythema or crepitus. No tenderness.     Comments: Negative for popliteal fullness bilaterally  Skin:    General: Skin is warm and dry.     Findings: No  erythema.  Neurological:     Mental Status: She is alert.     Assessment & Plan:   See Encounters Tab for problem based charting.  Patient discussed with Dr. Jimmye Norman

## 2021-01-12 ENCOUNTER — Telehealth: Payer: Self-pay | Admitting: Internal Medicine

## 2021-01-12 LAB — LIPID PANEL
Chol/HDL Ratio: 3.6 ratio (ref 0.0–4.4)
Cholesterol, Total: 160 mg/dL (ref 100–199)
HDL: 45 mg/dL (ref >39)
LDL Chol Calc (NIH): 80 mg/dL (ref 0–99)
Triglycerides: 211 mg/dL — ABNORMAL HIGH (ref 0–149)
VLDL Cholesterol Cal: 35 mg/dL (ref 5–40)

## 2021-01-12 LAB — VITAMIN D 25 HYDROXY (VIT D DEFICIENCY, FRACTURES): Vit D, 25-Hydroxy: 62.3 ng/mL (ref 30.0–100.0)

## 2021-01-12 MED ORDER — ATORVASTATIN CALCIUM 40 MG PO TABS: 40.0000 mg | ORAL_TABLET | Freq: Every day | ORAL | 11 refills | Status: DC

## 2021-01-12 NOTE — Addendum Note (Signed)
Addended by: Renato Battles on: 01/12/2021 01:40 PM   Modules accepted: Orders

## 2021-01-12 NOTE — Telephone Encounter (Signed)
Left VM letting patient know I was calling to review lab results from yesterday. Her triglycerides were elevated and LDL is not goal at this time so I am increasing her Lipitor from 20 mg daily to 40 mg daily. The prescription has been sent in.

## 2021-01-14 ENCOUNTER — Telehealth: Payer: Self-pay

## 2021-01-14 NOTE — Telephone Encounter (Signed)
Return pt's call - stated Dr Marlou Sa had called herTuesday and she missed the call. Pt informed of Dr Randel Pigg response:  "Her triglycerides were elevated and LDL is not goal at this time so I am increasing her Lipitor from 20 mg daily to 40 mg daily. The prescription has been sent in." Pt stated the med was delivered today.

## 2021-01-14 NOTE — Telephone Encounter (Signed)
Requesting to speak with Dr. Marlou Sa, please call pt back.

## 2021-01-17 NOTE — Progress Notes (Signed)
Internal Medicine Clinic Attending  I saw and evaluated the patient.  I personally confirmed the key portions of the history and exam documented by Dr.  Dean  and I reviewed pertinent patient test results.  The assessment, diagnosis, and plan were formulated together and I agree with the documentation in the resident's note.  

## 2021-02-14 NOTE — Progress Notes (Deleted)
02/14/2021 Alexis Kline [id number] June 23, 1945   Chief Complaint:  Dyspepsia   History of Present Illness: Alexis Kline is a 75 year old female with a past medical history of hypertension, hyperlipidemia, nonobstructive coronary artery disease to the LAD per cardiac catheterization in 2012, GERD and colon polyps.   History of nonobstructive coronary artery disease. She was last seen by cardiologist Dr. Shelva Majestic on 12/22/2020. At that time, her BP and cholesterol levels were stable and she was advised to continue taking Amlodipine '5mg'$  QD, Carvedilol '25mg'$  bid, HCTZ '25mg'$  QD, Atorvastatin '20mg'$  QD and to follow up in one year.   A  nuclear perfusion study in May 2018 showed normal perfusion with hyperdynamic ejection fraction and normal wall motion. ECHO 10/20/2016 with LV EF 65 - 70%. Grade 1 diastolic dysfunction.   Colonoscopy 08/17/2020 by Dr. Ardis Hughs: - Three 2 to 4 mm polyps in the sigmoid colon, in the transverse colon and in the cecum, removed with a cold snare. Resected and retrieved. - Diverticulosis in the left colon. - The examination was otherwise normal on direct and retroflexion views. - Recall colonoscopy 3 years   CBC Latest Ref Rng & Units 10/23/2020 07/09/2019 03/28/2018  WBC 3.4 - 10.8 x10E3/uL 9.2 8.6 6.3  Hemoglobin 11.1 - 15.9 g/dL 12.8 12.9 13.1  Hematocrit 34.0 - 46.6 % 40.5 43.0 41.9  Platelets 150 - 450 x10E3/uL 303 261 269    CMP Latest Ref Rng & Units 10/23/2020 07/09/2019 03/25/2019  Glucose 65 - 99 mg/dL 80 87 83  BUN 8 - 27 mg/dL '23 17 15  '$ Creatinine 0.57 - 1.00 mg/dL 0.78 0.61 0.80  Sodium 134 - 144 mmol/L 141 143 141  Potassium 3.5 - 5.2 mmol/L 4.0 4.8 3.5  Chloride 96 - 106 mmol/L 100 105 103  CO2 20 - 29 mmol/L '22 25 24  '$ Calcium 8.7 - 10.3 mg/dL 9.9 9.6 9.7  Total Protein 6.0 - 8.5 g/dL - 6.8 -  Total Bilirubin 0.0 - 1.2 mg/dL - 0.3 -  Alkaline Phos 39 - 117 IU/L - 56 -  AST 0 - 40 IU/L - 27 -  ALT 0 - 32 IU/L - 16 -    Lipid Panel      Component Value Date/Time   CHOL 160 01/11/2021 1410   TRIG 211 (H) 01/11/2021 1410   HDL 45 01/11/2021 1410   CHOLHDL 3.6 01/11/2021 1410   CHOLHDL 3.8 01/01/2014 1433   VLDL 38 01/01/2014 1433   LDLCALC 80 01/11/2021 1410   LABVLDL 35 01/11/2021 1410    Past Medical History:  Diagnosis Date   APPENDECTOMY, HX OF 07/10/2006   Qualifier: Diagnosis of  By: Oretha Ellis     Arthritis    back, hands, legs , feet    CAD (coronary artery disease) 2007   nonobstructive, 30% LAD 02/2006   COPD (chronic obstructive pulmonary disease) ([place])    Fibroadenoma 2008   right breast - based on [place] 09/2006   GERD (gastroesophageal reflux disease)    meds helps    Hemorrhoids    HLD (hyperlipidemia)    Hypertension    HYSTERECTOMY, HX OF 07/10/2006   Qualifier: Diagnosis of  By: Oretha Ellis     Myocardial infarct, old    Stable angina ([place])    chronic    Stroke ([place])    many years ago ~ 30 yrs per pt    Past Surgical History:  Procedure Laterality Date   ABDOMINAL  Theodosia   BREAST EXCISIONAL BIOPSY Right 1988   BREAST LUMPECTOMY     right breat   CARPAL TUNNEL RELEASE  1991, 1993   3 operations in right hand and 2 operations in left hand. Dr. Redmond Pulling in [place]   COLONOSCOPY  02/04/2015   [facility]   COLONOSCOPY  08/17/2020   [facility]   POLYPECTOMY     PTCA  2007   rectal stapling  2008   Done by Dr. Marlou Starks   RECTAL SURGERY  01/07/2007   Dr. Marlou Starks in [place].    SHOULDER SURGERY  1991   Right shoulder surgery in 1991 and left shoulder surgery in 1992 by Dr. Redmond Pulling in [place].        Current Medications, Allergies, Past Medical History, Past Surgical History, Family History and Social History were reviewed in [employer] record.   Review of Systems:   Constitutional: Negative for fever, sweats, chills or weight loss.  Respiratory: Negative for shortness of breath.    Cardiovascular: Negative for chest pain, palpitations and leg swelling.  Gastrointestinal: See HPI.  Musculoskeletal: Negative for back pain or muscle aches.  Neurological: Negative for dizziness, headaches or paresthesias.    Physical Exam: LMP 06/20/1986  General: Well developed, w   ***female in no acute distress. Head: Normocephalic and atraumatic. Eyes: No scleral icterus. Conjunctiva pink . Ears: Normal auditory acuity. Mouth: Dentition intact. No ulcers or lesions.  Lungs: Clear throughout to auscultation. Heart: Regular rate and rhythm, no murmur. Abdomen: Soft, nontender and nondistended. No masses or hepatomegaly. Normal bowel sounds x 4 quadrants.  Rectal: *** Musculoskeletal: Symmetrical with no gross deformities. Extremities: No edema. Neurological: Alert oriented x 4. No focal deficits.  Psychological: Alert and cooperative. Normal mood and affect  Assessment and Recommendations:   1) GERD  2) History of colon polyps  -Next colonoscopy due 07/2023  3) Nonobstructive coronary artery disease

## 2021-02-15 ENCOUNTER — Ambulatory Visit: Payer: Medicare Other | Admitting: Nurse Practitioner

## 2021-02-18 ENCOUNTER — Other Ambulatory Visit: Payer: Self-pay | Admitting: Internal Medicine

## 2021-02-18 ENCOUNTER — Ambulatory Visit (INDEPENDENT_AMBULATORY_CARE_PROVIDER_SITE_OTHER): Payer: Medicare Other | Admitting: Nurse Practitioner

## 2021-02-18 ENCOUNTER — Encounter: Payer: Self-pay | Admitting: Nurse Practitioner

## 2021-02-18 VITALS — BP 146/68 | HR 85 | Ht 62.0 in | Wt 167.0 lb

## 2021-02-18 DIAGNOSIS — K5900 Constipation, unspecified: Secondary | ICD-10-CM

## 2021-02-18 DIAGNOSIS — R140 Abdominal distension (gaseous): Secondary | ICD-10-CM | POA: Diagnosis not present

## 2021-02-18 NOTE — Progress Notes (Signed)
I agree with the above note, plan 

## 2021-02-18 NOTE — Progress Notes (Signed)
02/18/2021 Alexis Kline [id number] 06-12-1946   Chief Complaint: constipation, abdominal bloat  History of Present Illness: Alexis Kline is a 75 year old female with a past medical history of nonobstructive CAD, hypertension, COPD, GERD and colon polyps. She is followed by Dr. Ardis Hughs.  She presents to our office today for further evaluation regarding constipation with abdominal gas bloat which started within 1 day following her most recent colonoscopy.  She underwent a colonoscopy by Dr. Ardis Hughs 08/17/2020 which identified 3 tubular adenomatous polyps which were removed from the colon and diverticulosis to the left colon.  Prior to this colonoscopy, she reported passing 3-4 normal formed brown bowel movements daily.  She developed significant constipation following her 07/2020 colonoscopy.  She reports passing small formed stool or a cluster of bits of formed stool every 3 to 4 days with associated abdominal gas bloat.  No significant abdominal pain.  She drinks more than 64 ounces of water daily.  She tried Metamucil then switched to Benefiber daily.  She took MiraLAX which resulted in diarrhea.  She reduced the dose of MiraLAX to 1 tablespoon which still resulted in diarrhea.  Milk of magnesia resulted in vomiting.  She is taking senna/docusate 1 tab nightly, if she takes 2 tablets she develops diarrhea.  No new medications in the past 6 months.  Her Lipitor dose was increased from 20 to 40 mg a several months ago.  She reports eating a high-fiber diet.  Her GERD symptoms are well controlled at this time.  She remains on Omeprazole 40 mg daily.  No other complaints at this time.  Colonoscopy 08/17/2020 by Dr. Ardis Hughs: - Three 2 to 4 mm polyps in the sigmoid colon, in the transverse colon and in the cecum, removed with a cold snare. Resected and retrieved. - Diverticulosis in the left colon. - The examination was otherwise normal on direct and retroflexion views. - 3 year recall  urgical [P], colon, sigmoid, transverse and ascending, polyp (3) - TUBULAR ADENOMA (X3). - NEGATIVE FOR HIGH GRADE DYSPLASIA  CBC Latest Ref Rng & Units 10/23/2020 07/09/2019 03/28/2018  WBC 3.4 - 10.8 x10E3/uL 9.2 8.6 6.3  Hemoglobin 11.1 - 15.9 g/dL 12.8 12.9 13.1  Hematocrit 34.0 - 46.6 % 40.5 43.0 41.9  Platelets 150 - 450 x10E3/uL 303 261 269    CMP Latest Ref Rng & Units 10/23/2020 07/09/2019 03/25/2019  Glucose 65 - 99 mg/dL 80 87 83  BUN 8 - 27 mg/dL '23 17 15  '$ Creatinine 0.57 - 1.00 mg/dL 0.78 0.61 0.80  Sodium 134 - 144 mmol/L 141 143 141  Potassium 3.5 - 5.2 mmol/L 4.0 4.8 3.5  Chloride 96 - 106 mmol/L 100 105 103  CO2 20 - 29 mmol/L '22 25 24  '$ Calcium 8.7 - 10.3 mg/dL 9.9 9.6 9.7  Total Protein 6.0 - 8.5 g/dL - 6.8 -  Total Bilirubin 0.0 - 1.2 mg/dL - 0.3 -  Alkaline Phos 39 - 117 IU/L - 56 -  AST 0 - 40 IU/L - 27 -  ALT 0 - 32 IU/L - 16 -   TSH 1.46 on 07/09/2019  Current Outpatient Medications on File Prior to Visit  Medication Sig Dispense Refill   amLODipine (NORVASC) 5 MG tablet Take 1 tablet (5 mg total) by mouth daily. 90 tablet 2   aspirin 81 MG EC tablet Take 1 tablet (81 mg total) by mouth daily. 90 tablet 3   Aspirin-Caffeine (BAYER BACK & BODY) 500-32.5 MG TABS Take by  mouth as needed.     atorvastatin (LIPITOR) 20 MG tablet TAKE ONE TABLET BY MOUTH ONCE DAILY 30 tablet 0   atorvastatin (LIPITOR) 40 MG tablet Take 1 tablet (40 mg total) by mouth daily. 30 tablet 11   B Complex-Biotin-FA (SUPER B-COMPLEX PO) Take 1 tablet by mouth daily.     Calcium Carbonate-Vit D-Min (CALTRATE 600+D PLUS MINERALS) 600-800 MG-UNIT CHEW Chew 1 tablet by mouth in the morning and at bedtime.     carvedilol (COREG) 25 MG tablet Take 1 tablet (25 mg total) by mouth 2 (two) times daily with a meal. 180 tablet 2   hydrochlorothiazide (HYDRODIURIL) 25 MG tablet Take 1 tablet (25 mg total) by mouth daily. 90 tablet 2   nitroGLYCERIN (NITROSTAT) 0.4 MG SL tablet DISSOLVE 1 TABLET UNDER TONGUE  AS NEEDED FOR CHEST PAIN,MAY REPEAT IN5 MINUTES FOR 3 DOSES. 25 tablet 1   omeprazole (PRILOSEC) 40 MG capsule TAKE ONE CAPSULE BY MOUTH TWICE DAILY 180 capsule 0   senna-docusate (SENNALAX-S) 8.6-50 MG tablet Take 2 tablets by mouth 2 (two) times daily. 120 tablet 2   No current facility-administered medications on file prior to visit.   Allergies  Allergen Reactions   [name] Isothiocyanate] Shortness Of Breath   Iodine     Skin discoloration (turns orange)   Other     Aloe Vera and mustard "causes me to faint"   Tape     Current Medications, Allergies, Past Medical History, Past Surgical History, Family History and Social History were reviewed in [employer] record.   Review of Systems:   Constitutional: Negative for fever, sweats, chills or weight loss.  Respiratory: Negative for shortness of breath.   Cardiovascular: Negative for chest pain, palpitations and leg swelling.  Gastrointestinal: See HPI.  Musculoskeletal: + Back pain.  Neurological: Negative for dizziness, headaches or paresthesias.    Physical Exam: BP (!) 146/68   Pulse 85   Ht '5\' 2"'$  (1.575 m)   Wt 167 lb (75.8 kg)   LMP 06/20/1986   BMI 30.54 kg/m   General: 75 year old female in no acute distress. Head: Normocephalic and atraumatic. Eyes: No scleral icterus. Conjunctiva pink . Ears: Normal auditory acuity. Mouth: Dentition intact. No ulcers or lesions.  Lungs: Clear throughout to auscultation. Heart: Regular rate and rhythm, no murmur. Abdomen: Soft, nontender and nondistended. No masses or hepatomegaly. Normal bowel sounds x 4 quadrants.  Rectal: Deferred. Musculoskeletal: Kyphosis. Symmetrical with no gross deformities. Extremities: No edema. Neurological: Alert oriented x 4. No focal deficits.  Psychological: Alert and cooperative. Normal mood and affect  Assessment and Recommendations:  47) 75 year old female with constipation and bloat which started shortly  after her 07/2020 colonoscopy -Phillip's bacteria probiotic QD x 4 weeks -If no improvement in 2 weeks add Florastor yeast probiotic one po bid x 4 weeks -Ibgard 1 po bid  -Stool softener/Senna 1 Q HS as tolerated  -Patient to call me in 2 to 3 weeks with an update -Follow-up as needed  2) History of colon polyps -Next colonoscopy due 07/2023  3) GERD, stable  -Continue omeprazole 40 mg daily  4) CAD, stable  -Due to see cardiologist Dr. Claiborne Billings 12/2021

## 2021-02-18 NOTE — Patient Instructions (Addendum)
If you are age 75 or older, your body mass index should be between 23-30. Your Body mass index is 30.54 kg/m. If this is out of the aforementioned range listed, please consider follow up with your Primary Care Provider.  The [facility] providers would like to encourage you to use [facility] to communicate with providers for non-urgent requests or questions.  Due to long hold times on the telephone, sending your provider a message by [facility] may be faster and more efficient way to get a response. Please allow 48 business hours for a response.  Please remember that this is for non-urgent requests/questions.  OVER THE COUNTER MEDICATION Please purchase the following medications over the counter and take as directed:  Hardin Negus Bacteria Probiotic once a day for 4 weeks.  In  2 weeks add Florastor Probiotic 1 twice a day for 4 weeks. If you do not have any improvement then take Ibgard 1 twice a day.  Call or send a message on [facility] in 2-3 weeks to let me know how you are doing. Follow up as needed.   It was great seeing you today! Thank you for entrusting me with your care and choosing [facility].  Noralyn Pick, CRNP

## 2021-02-25 ENCOUNTER — Telehealth: Payer: Self-pay

## 2021-02-25 NOTE — Telephone Encounter (Signed)
Patient is due for AWV.Call patient no answer or voicemail to leave a message. 

## 2021-03-23 ENCOUNTER — Encounter: Payer: Medicare Other | Admitting: Student

## 2021-03-24 ENCOUNTER — Ambulatory Visit (INDEPENDENT_AMBULATORY_CARE_PROVIDER_SITE_OTHER): Payer: Medicare Other | Admitting: Student

## 2021-03-24 ENCOUNTER — Encounter: Payer: Self-pay | Admitting: Student

## 2021-03-24 VITALS — BP 116/72 | HR 58 | Temp 98.4°F | Wt 170.7 lb

## 2021-03-24 DIAGNOSIS — I10 Essential (primary) hypertension: Secondary | ICD-10-CM | POA: Diagnosis not present

## 2021-03-24 DIAGNOSIS — Z23 Encounter for immunization: Secondary | ICD-10-CM

## 2021-03-24 DIAGNOSIS — E785 Hyperlipidemia, unspecified: Secondary | ICD-10-CM

## 2021-03-24 DIAGNOSIS — K581 Irritable bowel syndrome with constipation: Secondary | ICD-10-CM

## 2021-03-24 NOTE — Assessment & Plan Note (Signed)
History of HLD, recent increase in dose of lipitor to 40mg  daily given LDL slightly above goal. She is tolerating the dose change well and no complaints at this time.  -continue lipitor 40mg  daily

## 2021-03-24 NOTE — Assessment & Plan Note (Signed)
Influenza vaccine administered today.

## 2021-03-24 NOTE — Assessment & Plan Note (Signed)
Vitals:   03/24/21 0909  BP: 116/72   History of HTN, currently well controlled on norvasc 5mg  daily, coreg 25mg  BID, and HCTZ 25mg  daily. BP at goal in clinic today. Will continue current medications at this time. Discussed calling [facility] should she need any refills on her medications.  -continue current medications

## 2021-03-24 NOTE — Patient Instructions (Signed)
Alexis Kline,  It was a pleasure seeing you in the clinic today.   Please schedule a follow up visit with the stomach doctor to better evaluate your constipation. Your blood pressure looks great today! You received your flu shot today. Please try to avoid taking bayer back and body with your aspirin as too much aspirin daily is not good for your kidneys.  Please call our clinic at [phone] if you have any questions or concerns. The best time to call is Monday-Friday from 9am-4pm, but there is someone available 24/7 at the same number. If you need medication refills, please notify your pharmacy one week in advance and they will send [place] a request.   Thank you for letting us take part in your care. We look forward to seeing you next time!

## 2021-03-24 NOTE — Assessment & Plan Note (Signed)
Patient reports chronic history of bloating and constipation since receiving a colonoscopy in 08/17/2020. Prior to colonoscopy, was forming 3-4 normal stools, but afterwards has been experiencing constipation (1 BM/day) along with bloating. States that taking 1 Senna-S tablet does not help, but taking 2 tablets causes diarrhea. Has tried miralax in the past, but that also caused diarrhea. Saw GI on 9/1 who recommended adding florastor yeast probiotic BID x4 weeks and Ibgard BID, but neither of these has helped.   She states that she has not yet called GI to schedule f/u to discuss failure of these interventions, but she will do so in order to obtain further evaluation and management. I think this is reasonable.  On chart review, it appears that patient has history of IBS, predominantly constipation that began even prior to recent colonoscopy.  Plan: -continue Senna-S -revisit GI for further management

## 2021-03-24 NOTE — Progress Notes (Signed)
   CC: f/u of chronic medical conditions  HPI:  Alexis Kline is a 75 y.o. female with history listed below presenting to the [facility] for f/u of her chronic medical conditions. Please see individualized problem based charting for full HPI.  Past Medical History:  Diagnosis Date   Acute pain of left shoulder 02/22/2017   APPENDECTOMY, HX OF 07/10/2006   Qualifier: Diagnosis of  By: Oretha Ellis     Arthritis    back, hands, legs , feet    CAD (coronary artery disease) 2007   nonobstructive, 30% LAD 02/2006   COPD (chronic obstructive pulmonary disease) ([place])    Diarrhea 04/12/2019   Fever 04/10/2020   Fibroadenoma 2008   right breast - based on [place] 09/2006   GERD (gastroesophageal reflux disease)    meds helps    Hemorrhoids    HLD (hyperlipidemia)    Hypertension    HYSTERECTOMY, HX OF 07/10/2006   Qualifier: Diagnosis of  By: Oretha Ellis     Left flank pain 07/04/2019   Myocardial infarct, old    Pleuritic chest pain 01/01/2014   01/01/14: 4-5 month history of "clawing" sensation in her right lung that has worsened over the last month without an obvious trigger 01/02/14: CTA negative for PE   Stable angina ([facility])    chronic    Stroke ([place])    many years ago ~ 30 yrs per pt     Review of Systems:  Negative aside from that listed in individualized problem based charting.  Physical Exam:  Vitals:   03/24/21 0909  BP: 116/72  Pulse: (!) 58  Temp: 98.4 F (36.9 C)  TempSrc: Oral  SpO2: 98%  Weight: 170 lb 11.2 oz (77.4 kg)   Physical Exam Constitutional:      Appearance: She is obese. She is not ill-appearing.  HENT:     Head: Normocephalic and atraumatic.     Mouth/Throat:     Mouth: Mucous membranes are moist.     Pharynx: Oropharynx is clear. No oropharyngeal exudate.  Eyes:     Extraocular Movements: Extraocular movements intact.     Conjunctiva/sclera: Conjunctivae normal.     Pupils: Pupils are equal, round, and reactive to light.   Cardiovascular:     Rate and Rhythm: Normal rate and regular rhythm.     Pulses: Normal pulses.     Heart sounds: Normal heart sounds. No murmur heard.   No friction rub. No gallop.  Pulmonary:     Effort: Pulmonary effort is normal.     Breath sounds: Normal breath sounds. No wheezing, rhonchi or rales.  Abdominal:     General: There is no distension.     Palpations: Abdomen is soft.     Tenderness: There is no abdominal tenderness. There is no guarding or rebound.     Comments: Mildly hypoactive bowel sounds  Musculoskeletal:        General: No swelling. Normal range of motion.  Skin:    General: Skin is warm and dry.  Neurological:     General: No focal deficit present.     Mental Status: She is alert and oriented to person, place, and time.  Psychiatric:        Mood and Affect: Mood normal.        Behavior: Behavior normal.     Assessment & Plan:   See Encounters Tab for problem based charting.  Patient discussed with Dr. Dareen Piano

## 2021-03-25 NOTE — Progress Notes (Signed)
Internal Medicine Clinic Attending  Case discussed with Dr. Jinwala  At the time of the visit.  We reviewed the resident's history and exam and pertinent patient test results.  I agree with the assessment, diagnosis, and plan of care documented in the resident's note.  

## 2021-03-29 NOTE — Addendum Note (Signed)
Addended by: Virl Axe on: 03/29/2021 09:24 AM   Modules accepted: Level of Service

## 2021-05-25 ENCOUNTER — Other Ambulatory Visit: Payer: Self-pay | Admitting: Internal Medicine

## 2021-07-20 ENCOUNTER — Ambulatory Visit (INDEPENDENT_AMBULATORY_CARE_PROVIDER_SITE_OTHER): Payer: Medicare Other | Admitting: Internal Medicine

## 2021-07-20 ENCOUNTER — Encounter: Payer: Self-pay | Admitting: Internal Medicine

## 2021-07-20 VITALS — BP 155/78 | HR 58 | Temp 98.0°F | Wt 165.1 lb

## 2021-07-20 DIAGNOSIS — I2089 Other forms of angina pectoris: Secondary | ICD-10-CM

## 2021-07-20 DIAGNOSIS — I208 Other forms of angina pectoris: Secondary | ICD-10-CM

## 2021-07-20 DIAGNOSIS — K581 Irritable bowel syndrome with constipation: Secondary | ICD-10-CM

## 2021-07-20 DIAGNOSIS — L720 Epidermal cyst: Secondary | ICD-10-CM | POA: Diagnosis not present

## 2021-07-20 DIAGNOSIS — M810 Age-related osteoporosis without current pathological fracture: Secondary | ICD-10-CM

## 2021-07-20 DIAGNOSIS — I10 Essential (primary) hypertension: Secondary | ICD-10-CM | POA: Diagnosis not present

## 2021-07-20 DIAGNOSIS — R69 Illness, unspecified: Secondary | ICD-10-CM | POA: Diagnosis not present

## 2021-07-20 MED ORDER — HYDROCHLOROTHIAZIDE 12.5 MG PO TABS: 12.5000 mg | ORAL_TABLET | Freq: Every day | ORAL | 1 refills | Status: DC

## 2021-07-20 MED ORDER — HYDROCHLOROTHIAZIDE 12.5 MG PO TABS: 25.0000 mg | ORAL_TABLET | Freq: Every day | ORAL | 1 refills | Status: DC

## 2021-07-20 NOTE — Patient Instructions (Signed)
[name], it was a pleasure seeing you today!  Today we discussed:  Blood pressure drops -Decrease hydrochlorothiazide to 12.5mg  daily -Continue taking coreg and amlodipine at the current dosing -Continue to record your blood pressures and symptoms as you have been. This is incredibly helpful to Alexis Kline! -Follow up in 2 weeks in the clinic so we can recheck your vital signs and symptoms  Back Cyst -I have placed a referral for you today to discuss surgical options  Follow-up: 2 weeks for blood pressure recheck   Please make sure to arrive 15 minutes prior to your next appointment. If you arrive late, you may be asked to reschedule.   We look forward to seeing you next time. Please call our clinic at [phone] if you have any questions or concerns. The best time to call is Monday-Friday from 9am-4pm, but there is someone available 24/7. If after hours or the weekend, call the main hospital number and ask for the Internal Medicine Resident On-Call. If you need medication refills, please notify your pharmacy one week in advance and they will send Alexis Kline a request.  Thank you for letting Alexis Kline take part in your care. Wishing you the best!

## 2021-07-20 NOTE — Assessment & Plan Note (Signed)
She has experienced ongoing bloating and GI symptoms. She was seen by GI in September of last year but has not yet followed up with them, as recommended at her last [facility] visit. I encouraged her to reach out to their office to discuss alternative management options.

## 2021-07-20 NOTE — Assessment & Plan Note (Addendum)
Current medications: amlodipine 5mg  daily, coreg 25mg  BID, hctz 25mg  daily She brought along a home blood pressure log which shows average systolic pressures ranging 100-126mmHg.  She is complaining of orthostatic symptoms, including flushing, lightheadedness, and vision narrowing that happens quite frequently at home with position changes. Fortunately, she has not experienced any falls attributable to this.  Orthostatic vital signs are positive in the clinic today.  She is mildly bradycardic at HR of 58 however, upon review of prior vitals and EKG, this does not seem to be far outside the norm.   Will start with decreasing hctz to 12.5mg   Check BMP today to r/o electrolyte derangements or kidney dysfunction  Encouraged use of compression stockings. She has these at home and primarily wears them at night. I encouraged her to use these during the day  She will follow up in 2 weeks for blood pressure recheck. She will bring a log book of her home blood pressures to that visit again. Recommend repeating orthostatic vitals at that time as well.  If symptoms and orthostasis persist, could consider decreasing coreg dose. Alternatively, could consider discontinuing hctz all together and adding an ACEi/ARB if needed

## 2021-07-20 NOTE — Progress Notes (Signed)
Internal Medicine Clinic Attending  Case discussed with Dr. Christian  At the time of the visit.  We reviewed the resident's history and exam and pertinent patient test results.  I agree with the assessment, diagnosis, and plan of care documented in the resident's note.  

## 2021-07-20 NOTE — Assessment & Plan Note (Signed)
She has a fairly large cyst located over the left paraspinal region of her upper thorax. She notes that it has been present for several years but has slowly grown. It has become painful for her, when lying down or sitting back in a chair, due to it's size. She is interested in visiting with a surgeon to discuss excision.  General surgery referral placed

## 2021-07-20 NOTE — Assessment & Plan Note (Signed)
Recommended pharmacological treatment of osteoporosis however she declines. Would continue to have ongoing discussions at follow up visits.

## 2021-07-20 NOTE — Assessment & Plan Note (Addendum)
Denies anginal symptoms. Carries nitroglycerin with her but has not needed to use it. Perfusion scan in 2018 unremarkable. She will continue to follow with cardiology.

## 2021-07-20 NOTE — Progress Notes (Signed)
Office Visit   Patient ID: Alexis Kline, female    DOB: 11-Oct-1945, 76 y.o.   MRN: [id number]   PCP: Farrel Gordon, DO   Subjective:   Alexis Kline is a 76 y.o. year old female who presents for routine follow up of hypertension. Please refer to problem based charting for HPI details, assessment and plan.     Objective:   BP (!) 155/78 (BP Location: Left Arm, Patient Position: Sitting, Cuff Size: Normal)    Pulse (!) 58    Temp 98 F (36.7 C) (Oral)    Wt 165 lb 1.6 oz (74.9 kg)    LMP 06/20/1986    SpO2 100%    BMI 30.20 kg/m   Orthostatic VS for the past 24 hrs (Last 3 readings):  BP- Lying Pulse- Lying BP- Sitting Pulse- Sitting BP- Standing at 0 minutes Pulse- Standing at 0 minutes  07/20/21 0956 145/87 59 125/75 56 112/62 58    General: well appearing, no distress Cardiac: RRR, no LE edema, superficial varicose veins present in the lower extremities.  Pulm: lungs clear throughout GI: abd soft, non-tender Skin: large cystic lesion over the left paraspinal region of the upper thorax. Mildly tender to palpation. No erythema or drainage.   Assessment & Plan:   Problem List Items Addressed This Visit       Cardiovascular and Mediastinum   Essential hypertension - Primary (Chronic)    Current medications: amlodipine 5mg  daily, coreg 25mg  BID, hctz 25mg  daily She brought along a home blood pressure log which shows average systolic pressures ranging 100-193mmHg.  She is complaining of orthostatic symptoms, including flushing, lightheadedness, and vision narrowing that happens quite frequently at home with position changes. Fortunately, she has not experienced any falls attributable to this.  Orthostatic vital signs are positive in the clinic today.  She is mildly bradycardic at HR of 58 however, upon review of prior vitals and EKG, this does not seem to be far outside the norm.  Will start with decreasing hctz to 12.5mg  Check BMP today to r/o electrolyte derangements  or kidney dysfunction Encouraged use of compression stockings. She has these at home and primarily wears them at night. I encouraged her to use these during the day She will follow up in 2 weeks for blood pressure recheck. She will bring a log book of her home blood pressures to that visit again. Recommend repeating orthostatic vitals at that time as well. If symptoms and orthostasis persist, could consider decreasing coreg dose. Alternatively, could consider discontinuing hctz all together and adding an ACEi/ARB if needed      Relevant Medications   hydrochlorothiazide (HYDRODIURIL) 12.5 MG tablet   Other Relevant Orders   Basic metabolic panel   Stable angina ([place])    Denies anginal symptoms. Carries nitroglycerin with her but has not needed to use it. Perfusion scan in 2018 unremarkable. She will continue to follow with cardiology.      Relevant Medications   hydrochlorothiazide (HYDRODIURIL) 12.5 MG tablet     Digestive   IRRITABLE BOWEL, PREDOMINANTLY CONSTIPATION    She has experienced ongoing bloating and GI symptoms. She was seen by GI in September of last year but has not yet followed up with them, as recommended at her last [facility] visit. I encouraged her to reach out to their office to discuss alternative management options.         Musculoskeletal and Integument   Osteoporosis (Chronic)    Recommended pharmacological treatment of osteoporosis  however she declines. Would continue to have ongoing discussions at follow up visits.         Other   Inclusion cyst    She has a fairly large cyst located over the left paraspinal region of her upper thorax. She notes that it has been present for several years but has slowly grown. It has become painful for her, when lying down or sitting back in a chair, due to it's size. She is interested in visiting with a surgeon to discuss excision. General surgery referral placed      Relevant Orders   Ambulatory referral to General Surgery    Return in about 2 weeks (around 08/03/2021) for Blood pressure follow up.  Pt discussed with Dr. Tressa Busman, MD Internal Medicine Resident PGY-3 [facility] 07/20/2021 12:50 PM

## 2021-07-21 LAB — BASIC METABOLIC PANEL
BUN/Creatinine Ratio: 22 (ref 12–28)
BUN: 19 mg/dL (ref 8–27)
CO2: 22 mmol/L (ref 20–29)
Calcium: 10 mg/dL (ref 8.7–10.3)
Chloride: 102 mmol/L (ref 96–106)
Creatinine, Ser: 0.86 mg/dL (ref 0.57–1.00)
Glucose: 105 mg/dL — ABNORMAL HIGH (ref 70–99)
Potassium: 4.2 mmol/L (ref 3.5–5.2)
Sodium: 141 mmol/L (ref 134–144)
eGFR: 70 mL/min/{1.73_m2} (ref >59)

## 2021-07-21 NOTE — Progress Notes (Signed)
Lab results reviewed with patient.

## 2021-08-10 ENCOUNTER — Ambulatory Visit (INDEPENDENT_AMBULATORY_CARE_PROVIDER_SITE_OTHER): Payer: Medicare Other | Admitting: Internal Medicine

## 2021-08-10 ENCOUNTER — Encounter: Payer: Self-pay | Admitting: Internal Medicine

## 2021-08-10 ENCOUNTER — Other Ambulatory Visit: Payer: Self-pay

## 2021-08-10 VITALS — BP 116/61 | HR 59 | Temp 98.0°F | Ht 62.0 in | Wt 163.5 lb

## 2021-08-10 DIAGNOSIS — W19XXXA Unspecified fall, initial encounter: Secondary | ICD-10-CM | POA: Insufficient documentation

## 2021-08-10 DIAGNOSIS — I10 Essential (primary) hypertension: Secondary | ICD-10-CM | POA: Diagnosis not present

## 2021-08-10 DIAGNOSIS — R195 Other fecal abnormalities: Secondary | ICD-10-CM | POA: Insufficient documentation

## 2021-08-10 MED ORDER — CARVEDILOL 25 MG PO TABS: 12.5000 mg | ORAL_TABLET | Freq: Two times a day (BID) | ORAL | 2 refills | Status: DC

## 2021-08-10 NOTE — Assessment & Plan Note (Addendum)
Patient fell on 2/16 out of a rolling chair. She became dizzy and had low BP upon standing. She fell hitting her tailbone and the back of her neck on the chair. Since then she has been having significant pain. She is still able to ambulate but it is much more difficult. The pain has not improved since this fall. On exam she has reproducible bony tenderness along the cervical spine as well as her left hip and coccyx.  Plan: - Will obtain XR of C spine and pelvis

## 2021-08-10 NOTE — Assessment & Plan Note (Addendum)
Patient reports early satiety that has been going on for about six months. She feels like she never quite bounced back after her colonoscopy though her symptoms are worsening. She now says she gets full after about four crackers and piece of cheese. She says that she generally feels better if she does not eat. Her weight is down about 7 lbs in the last couple of months. Since 2/16 her stools have also become light in color, citing a sandy/straw color. This has never happened before. She had an episode of diarrhea when the pale stools started but otherwise denies change in stool consistency denying greasy or foul smelling stools. The only thing abnormal about them is the color. They have remained pale since the 16th.   Plan: - start with a CBC, CMP, and a UA - based on lab results will consider CTAP with contrast  Addendum: -CMP WNL will continue to monitor

## 2021-08-10 NOTE — Patient Instructions (Addendum)
Alexis Kline  It was a pleasure seeing you in the clinic today.   We talked about your blood pressure, your recent fall, and your abdominal pain. We will check some blood work today. Based on what your blood work says we will order some imaging to be done to check you for fractures.  Blood pressure: stop taking HCTZ, decrease your coreg to 12.5 daily  Please call our clinic at [phone] if you have any questions or concerns. The best time to call is Monday-Friday from 9am-4pm, but there is someone available 24/7 at the same number. If you need medication refills, please notify your pharmacy one week in advance and they will send [place] a request.   Thank you for letting us take part in your care. We look forward to seeing you next time!

## 2021-08-10 NOTE — Assessment & Plan Note (Signed)
Patient recently seen for dizziness and orthostatic hypotension. Her BP medications were decreased at that visit and she was instructed to keep a log. She has several documented episodes of dizziness and orthostatic vitals in her log, one of which resulted in a fall on 2/16.  Plan: - decrease coreg to 12.5 daily - stop HCTZ - continue amlodipine 5 - continue to log BP and follow up again in two weeks

## 2021-08-10 NOTE — Progress Notes (Signed)
° °  CC: fall  HPI:  [name] is a 76 y.o. PMH noted below, who presents to the [facility] with complaints of fall. To see the management of his acute and chronic conditions, please refer to the A&P note under the encounters tab.   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 - based on [place] 09/2006   GERD (gastroesophageal reflux disease)    meds helps    Hemorrhoids    HLD (hyperlipidemia)    Hypertension    HYSTERECTOMY, HX OF 07/10/2006   Qualifier: Diagnosis of  By: Oretha Ellis     Left flank pain 07/04/2019   Myocardial infarct, old    Pleuritic chest pain 01/01/2014   01/01/14: 4-5 month history of "clawing" sensation in her right lung that has worsened over the last month without an obvious trigger 01/02/14: CTA negative for PE   Stable angina (HCC)    chronic    Stroke ([place])    many years ago ~ 30 yrs per pt    Review of Systems:  positive for dizziness, fall, early satiety, abdominal pain, clay colored stools, diarrhea; negative for blood in stools  Physical Exam: Gen: uncomfortable elderly woman HEENT:tenderness along the base of the neck, no ecchymosis or bruising CV: RRR, no m/r/g, no pedal edema Resp: CTAB, normal WOB  GI: soft, mild tenderness to palpation diffusely MSK: pain over the coccyx and left hip Skin:warm and dry Neuro:alert answering questions appropriately Psych: normal affect   Assessment & Plan:   See Encounters Tab for problem based charting.  Patient seen with Dr.  Cain Sieve

## 2021-08-10 NOTE — Addendum Note (Signed)
Addended by: Inda Coke on: 08/10/2021 04:16 PM   Modules accepted: Orders

## 2021-08-11 LAB — URINALYSIS, ROUTINE W REFLEX MICROSCOPIC
Bilirubin, UA: NEGATIVE
Glucose, UA: NEGATIVE
Ketones, UA: NEGATIVE
Leukocytes,UA: NEGATIVE
Nitrite, UA: NEGATIVE
Protein,UA: NEGATIVE
RBC, UA: NEGATIVE
Specific Gravity, UA: 1.008 (ref 1.005–1.030)
Urobilinogen, Ur: 0.2 mg/dL (ref 0.2–1.0)
pH, UA: 5.5 (ref 5.0–7.5)

## 2021-08-11 LAB — CBC WITH DIFFERENTIAL/PLATELET

## 2021-08-11 NOTE — Addendum Note (Signed)
Addended by: Inda Coke on: 08/11/2021 11:30 AM   Modules accepted: Orders

## 2021-08-12 LAB — CBC WITH DIFFERENTIAL/PLATELET
Basophils Absolute: 0.1 10*3/uL (ref 0.0–0.2)
Basos: 1 %
EOS (ABSOLUTE): 0.2 10*3/uL (ref 0.0–0.4)
Eos: 3 %
Hematocrit: 42.1 % (ref 34.0–46.6)
Hemoglobin: 12.7 g/dL (ref 11.1–15.9)
Immature Grans (Abs): 0 10*3/uL (ref 0.0–0.1)
Immature Granulocytes: 0 %
Lymphocytes Absolute: 2.3 10*3/uL (ref 0.7–3.1)
Lymphs: 28 %
MCH: 25.7 pg — ABNORMAL LOW (ref 26.6–33.0)
MCHC: 30.2 g/dL — ABNORMAL LOW (ref 31.5–35.7)
MCV: 85 fL (ref 79–97)
Monocytes Absolute: 0.5 10*3/uL (ref 0.1–0.9)
Monocytes: 6 %
Neutrophils Absolute: 5.1 10*3/uL (ref 1.4–7.0)
Neutrophils: 62 %
Platelets: 291 10*3/uL (ref 150–450)
RBC: 4.94 x10E6/uL (ref 3.77–5.28)
RDW: 15.2 % (ref 11.7–15.4)
WBC: 8.3 10*3/uL (ref 3.4–10.8)

## 2021-08-12 LAB — CMP14 + ANION GAP
ALT: 19 IU/L (ref 0–32)
AST: 30 IU/L (ref 0–40)
Albumin/Globulin Ratio: 1.4 (ref 1.2–2.2)
Albumin: 4.3 g/dL (ref 3.7–4.7)
Alkaline Phosphatase: 68 IU/L (ref 44–121)
Anion Gap: 15 mmol/L (ref 10.0–18.0)
BUN/Creatinine Ratio: 24 (ref 12–28)
BUN: 18 mg/dL (ref 8–27)
Bilirubin Total: 0.4 mg/dL (ref 0.0–1.2)
CO2: 23 mmol/L (ref 20–29)
Calcium: 10.1 mg/dL (ref 8.7–10.3)
Chloride: 103 mmol/L (ref 96–106)
Creatinine, Ser: 0.74 mg/dL (ref 0.57–1.00)
Globulin, Total: 3 g/dL (ref 1.5–4.5)
Glucose: 74 mg/dL (ref 70–99)
Potassium: 4.5 mmol/L (ref 3.5–5.2)
Sodium: 141 mmol/L (ref 134–144)
Total Protein: 7.3 g/dL (ref 6.0–8.5)
eGFR: 84 mL/min/{1.73_m2} (ref >59)

## 2021-08-12 NOTE — Progress Notes (Signed)
Internal Medicine Clinic Attending  I saw and evaluated the patient.  I personally confirmed the key portions of the history and exam documented by Dr. DeMaio   and I reviewed pertinent patient test results.  The assessment, diagnosis, and plan were formulated together and I agree with the documentation in the resident's note.  

## 2021-08-24 ENCOUNTER — Ambulatory Visit (INDEPENDENT_AMBULATORY_CARE_PROVIDER_SITE_OTHER): Payer: Medicare Other | Admitting: Internal Medicine

## 2021-08-24 DIAGNOSIS — B349 Viral infection, unspecified: Secondary | ICD-10-CM | POA: Diagnosis not present

## 2021-08-24 NOTE — Progress Notes (Signed)
?  [facility] Telephone Encounter ?Continuity Care Appointment ? ?HPI:  ?This telephone encounter was created for [name] on 08/24/2021 for the following purpose/cc cough and diarrhea. Please see Assessment/plan for full documentation.   ? ?Past Medical History:  ?Past Medical History:  ?Diagnosis Date  ? Acute pain of left shoulder 02/22/2017  ? APPENDECTOMY, HX OF 07/10/2006  ? Qualifier: Diagnosis of  By: Oretha Ellis    ? Arthritis   ? back, hands, legs , feet   ? CAD (coronary artery disease) 2007  ? nonobstructive, 30% LAD 02/2006  ? COPD (chronic obstructive pulmonary disease) ([place])   ? Diarrhea 04/12/2019  ? Fever 04/10/2020  ? Fibroadenoma 2008  ? right breast - based on [place] 09/2006  ? GERD (gastroesophageal reflux disease)   ? meds helps   ? Hemorrhoids   ? HLD (hyperlipidemia)   ? Hypertension   ? HYSTERECTOMY, HX OF 07/10/2006  ? Qualifier: Diagnosis of  By: Oretha Ellis    ? Left flank pain 07/04/2019  ? Myocardial infarct, old   ? Pleuritic chest pain 01/01/2014  ? 01/01/14: 4-5 month history of "clawing" sensation in her right lung that has worsened over the last month without an obvious trigger 01/02/14: CTA negative for PE  ? Stable angina (HCC)   ? chronic   ? Stroke [facility])   ? many years ago ~ 30 yrs per pt   ?  ? ?ROS:  ?+ cough, fever, chills, diarrhea ?- chest pain, SOB  ? ?Assessment / Plan / Recommendations:  ?Please see A&P under problem oriented charting for assessment of the patient's acute and chronic medical conditions.  ?As always, pt is advised that if symptoms worsen or new symptoms arise, they should go to an urgent care facility or to to ER for further evaluation.  ? ?Consent and Medical Decision Making:  ?Patient discussed with Dr.  Saverio Danker ?This is a telephone encounter between Alexis Kline and Jose Persia on 08/24/2021 for cough and diarrhea. The visit was conducted with the patient located at home and Jose Persia at [facility]. The  patient's identity was confirmed using their DOB and current address. The patient has consented to being evaluated through a telephone encounter and understands the associated risks (an examination cannot be done and the patient may need to come in for an appointment) / benefits (allows the patient to remain at home, decreasing exposure to coronavirus). I personally spent 12 minutes on medical discussion.   ? ? ?

## 2021-08-25 DIAGNOSIS — B349 Viral infection, unspecified: Secondary | ICD-10-CM | POA: Insufficient documentation

## 2021-08-25 NOTE — Assessment & Plan Note (Addendum)
[name] states that she has been experiencing fever, chills, sore throat, nonproductive cough, sneezing and non-bloody diarrhea for approximately 3 days.  She did take a home COVID-19 test that was negative.  She denies any shortness of breath or chest pain.  She has a honey at home and lozenges that she has been taking for her cough; she would not like any prescription cough suppressants at this time. ? ?Assessment/plan: ?Symptoms consistent with acute viral infection.  Recommended supportive management and follow-up in the clinic in 7 days.  We discussed extensively red flag symptoms to prompt her to go to the ED immediately including shortness of breath, chest pain, bloody diarrhea.  She expressed understanding. ? ?- Supportive care ?- Follow-up in 1 week ?

## 2021-08-27 NOTE — Progress Notes (Signed)
Internal Medicine Clinic Attending  Case discussed with Dr. Basaraba  At the time of the visit.  We reviewed the resident's history and exam and pertinent patient test results.  I agree with the assessment, diagnosis, and plan of care documented in the resident's note.  

## 2021-09-02 ENCOUNTER — Other Ambulatory Visit: Payer: Self-pay | Admitting: Internal Medicine

## 2021-09-06 ENCOUNTER — Other Ambulatory Visit: Payer: Self-pay | Admitting: Cardiovascular Disease

## 2021-10-21 ENCOUNTER — Encounter: Payer: Self-pay | Admitting: *Deleted

## 2021-10-21 ENCOUNTER — Ambulatory Visit (INDEPENDENT_AMBULATORY_CARE_PROVIDER_SITE_OTHER): Payer: Medicare Other | Admitting: *Deleted

## 2021-10-21 DIAGNOSIS — Z1231 Encounter for screening mammogram for malignant neoplasm of breast: Secondary | ICD-10-CM

## 2021-10-21 DIAGNOSIS — Z135 Encounter for screening for eye and ear disorders: Secondary | ICD-10-CM

## 2021-10-21 DIAGNOSIS — M810 Age-related osteoporosis without current pathological fracture: Secondary | ICD-10-CM

## 2021-10-21 DIAGNOSIS — Z0000 Encounter for general adult medical examination without abnormal findings: Secondary | ICD-10-CM | POA: Diagnosis not present

## 2021-10-21 NOTE — Progress Notes (Addendum)
? ?Subjective:  ? Alexis Kline is a 76 y.o. female who presents for an Initial Medicare Annual Wellness Visit. I connected with  [name] on 10/21/21 by a audio enabled telemedicine application and verified that I am speaking with the correct person using two identifiers. ? ?Patient Location: Home ? ?Provider Location: Office/Clinic ? ?I discussed the limitations of evaluation and management by telemedicine. The patient expressed understanding and agreed to proceed.  ? ?Review of Systems    ?Defer to pcp ?  ? ?   ?Objective:  ?  ?There were no vitals filed for this visit. ?There is no height or weight on file to calculate BMI. ? ? ?  08/10/2021  ?  2:23 PM 01/11/2021  ?  1:19 PM 10/23/2020  ? 10:33 AM 03/30/2020  ?  2:44 PM 03/25/2019  ?  3:27 PM 04/12/2018  ?  3:42 PM 02/13/2018  ?  4:28 PM  ?Advanced Directives  ?Does Patient Have a Medical Advance Directive? No Yes No No No No Yes  ?Type of IT consultant of [place];Living will  ?Does patient want to make changes to medical advance directive?       No - Patient declined  ?Copy of [place] in Chart?      No - copy requested No - copy requested  ?Would patient like information on creating a medical advance directive? Yes (ED - Information included in AVS)  No - Patient declined No - Patient declined No - Patient declined    ? ? ?Current Medications (verified) ?Outpatient Encounter Medications as of 10/21/2021  ?Medication Sig  ? amLODipine (NORVASC) 5 MG tablet Take 1 tablet (5 mg total) by mouth daily.  ? aspirin 81 MG EC tablet Take 1 tablet (81 mg total) by mouth daily.  ? Aspirin-Caffeine (BAYER BACK & BODY) 500-32.5 MG TABS Take by mouth as needed.  ? atorvastatin (LIPITOR) 40 MG tablet Take 1 tablet (40 mg total) by mouth daily.  ? B Complex-Biotin-FA (SUPER B-COMPLEX PO) Take 1 tablet by mouth daily.  ? Calcium Carbonate-Vit D-Min (CALTRATE 600+D PLUS MINERALS) 600-800 MG-UNIT CHEW Chew 1 tablet by  mouth in the morning and at bedtime.  ? carvedilol (COREG) 25 MG tablet Take 0.5 tablets (12.5 mg total) by mouth 2 (two) times daily with a meal.  ? omeprazole (PRILOSEC) 40 MG capsule TAKE ONE CAPSULE BY MOUTH TWICE DAILY  ? senna-docusate (SENNALAX-S) 8.6-50 MG tablet Take 2 tablets by mouth 2 (two) times daily.  ? ?No facility-administered encounter medications on file as of 10/21/2021.  ? ? ?Allergies (verified) ?[name] isothiocyanate], Iodine, Other, and Tape  ? ?History: ?Past Medical History:  ?Diagnosis Date  ? Acute pain of left shoulder 02/22/2017  ? APPENDECTOMY, HX OF 07/10/2006  ? Qualifier: Diagnosis of  By: Oretha Ellis    ? Arthritis   ? back, hands, legs , feet   ? CAD (coronary artery disease) 2007  ? nonobstructive, 30% LAD 02/2006  ? COPD (chronic obstructive pulmonary disease) ([place])   ? Diarrhea 04/12/2019  ? Fever 04/10/2020  ? Fibroadenoma 2008  ? right breast - based on [place] 09/2006  ? GERD (gastroesophageal reflux disease)   ? meds helps   ? Hemorrhoids   ? HLD (hyperlipidemia)   ? Hypertension   ? HYSTERECTOMY, HX OF 07/10/2006  ? Qualifier: Diagnosis of  By: Oretha Ellis    ? Left flank pain 07/04/2019  ?  Myocardial infarct, old   ? Pleuritic chest pain 01/01/2014  ? 01/01/14: 4-5 month history of "clawing" sensation in her right lung that has worsened over the last month without an obvious trigger 01/02/14: CTA negative for PE  ? Stable angina (HCC)   ? chronic   ? Stroke [facility])   ? many years ago ~ 30 yrs per pt   ? ?Past Surgical History:  ?Procedure Laterality Date  ? ABDOMINAL HYSTERECTOMY  1988  ? APPENDECTOMY  1966  ? BLADDER SUSPENSION  1997  ? BREAST EXCISIONAL BIOPSY Right 1988  ? BREAST LUMPECTOMY    ? right breat  ? [place]  ? 3 operations in right hand and 2 operations in left hand. Dr. Redmond Pulling in [place]  ? COLONOSCOPY  02/04/2015  ? Owens Loffler  ? COLONOSCOPY  08/17/2020  ? DJ  ? POLYPECTOMY    ? PTCA  2007  ? rectal stapling  2008  ? Done  by Dr. Marlou Starks  ? RECTAL SURGERY  01/07/2007  ? Dr. Marlou Starks in [place].   ? SHOULDER SURGERY  1991  ? Right shoulder surgery in 1991 and left shoulder surgery in 1992 by Dr. Redmond Pulling in [place].  ? ?Family History  ?Problem Relation Age of Onset  ? Hyperlipidemia Mother   ? Hypertension Mother   ? Hyperlipidemia Father   ? Hypertension Father   ? Heart disease Sister   ? Diabetes Sister   ? Stomach cancer Sister   ? Leukemia Brother   ? Stroke Neg Hx   ? Colon cancer Neg Hx   ? Colon polyps Neg Hx   ? Esophageal cancer Neg Hx   ? Rectal cancer Neg Hx   ? ?Social History  ? ?Socioeconomic History  ? Marital status: Widowed  ?  Spouse name: Not on file  ? Number of children: Not on file  ? Years of education: Not on file  ? Highest education level: Not on file  ?Occupational History  ? Not on file  ?Tobacco Use  ? Smoking status: Former  ?  Years: 54.00  ?  Types: Cigarettes  ?  Quit date: 06/20/2013  ?  Years since quitting: 8.3  ? Smokeless tobacco: Never  ?Substance and Sexual Activity  ? Alcohol use: Yes  ?  Alcohol/week: 0.0 standard drinks  ?  Comment: rarely "maybe a drink of New Year's"  ? Drug use: No  ? Sexual activity: Not Currently  ?Other Topics Concern  ? Not on file  ?Social History Narrative  ? Not on file  ? ?Social Determinants of Health  ? ?Financial Resource Strain: Not on file  ?Food Insecurity: Not on file  ?Transportation Needs: Not on file  ?Physical Activity: Not on file  ?Stress: Not on file  ?Social Connections: Not on file  ? ? ?Tobacco Counseling ?Counseling given: Not Answered ? ? ?Clinical Intake: ? ?  ? ?  ? ?  ? ?  ? ?  ? ?Diabetic?No ? ?  ? ?  ? ? ?Activities of Daily Living ? ?  08/10/2021  ?  2:22 PM 07/20/2021  ?  9:47 AM  ?In your present state of health, do you have any difficulty performing the following activities:  ?Hearing? 0 0  ?Vision? 1 0  ?Difficulty concentrating or making decisions? 0 0  ?Walking or climbing stairs? 1 1  ?Dressing or bathing? 0 0  ?Doing errands, shopping? 1 0   ? ? ?Patient Care  Team: ?Farrel Gordon, DO as PCP - General ? ?Indicate any recent Medical Services you may have received from other than [facility] providers in the past year (date may be approximate). ? ?   ?Assessment:  ? This is a routine wellness examination for Hailea. ? ?Hearing/Vision screen ?No results found. ? ?Dietary issues and exercise activities discussed: ?  ? ? Goals Addressed   ?None ?  ? ?Depression Screen ? ?  08/10/2021  ?  2:22 PM 07/20/2021  ?  4:35 PM 03/24/2021  ?  9:11 AM 01/11/2021  ?  1:19 PM 10/23/2020  ? 10:23 AM 03/30/2020  ?  2:47 PM 03/25/2019  ?  4:14 PM  ?PHQ 2/9 Scores  ?PHQ - 2 Score 0 4 0 0 '1 1 1  '$ ?PHQ- 9 Score 0 10 1 0 '6 6 8  '$ ?  ?Fall Risk ? ?  08/10/2021  ?  2:22 PM 07/20/2021  ?  9:47 AM 03/24/2021  ?  9:10 AM 01/11/2021  ?  1:18 PM 10/23/2020  ? 10:23 AM  ?Fall Risk   ?Falls in the past year? '1 1 1 1 1  '$ ?Number falls in past yr: 0 '1 1 1 1  '$ ?Injury with Fall? 1 0 0 1 0  ?Risk for fall due to : Other (Comment) History of fall(s);Impaired balance/gait History of fall(s);Impaired balance/gait Impaired balance/gait History of fall(s);Impaired balance/gait  ?Follow up Falls evaluation completed;Falls prevention discussed Falls evaluation completed Falls evaluation completed  Falls evaluation completed  ? ? ?FALL RISK PREVENTION PERTAINING TO THE HOME: ? ?Any stairs in or around the home? No  ?If so, are there any without handrails?  N/A ?Home free of loose throw rugs in walkways, pet beds, electrical cords, etc? Yes  ?Adequate lighting in your home to reduce risk of falls? Yes  ? ?ASSISTIVE DEVICES UTILIZED TO PREVENT FALLS: ? ?Life alert? No  ?Use of a cane, walker or w/c? Yes -CANE ?Grab bars in the bathroom? Yes  ?Shower chair or bench in shower? No  ?Elevated toilet seat or a handicapped toilet? Yes  ? ?TIMED UP AND GO: ? ?Was the test performed? N/A ?Length of time to ambulate 10 feet: N/A sec.  ? ? ? ?Cognitive Function: ?  ?  ?  ? ?Immunizations ?Immunization History  ?Administered Date(s)  Administered  ? Fluad Quad(high Dose 65+) 03/24/2021  ? Influenza Whole 04/14/2008  ? Influenza,inj,Quad PF,6+ Mos 04/08/2013, 02/25/2014, 03/09/2015, 03/24/2016, 02/22/2017, 02/13/2018, 03/25/2019, 10/1

## 2021-10-25 NOTE — Progress Notes (Signed)
Internal Medicine Clinic Attending ? ?Case discussed. I reviewed the AWV findings.  I agree with the assessment, diagnosis, and plan of care documented in the AWV note.   ?  ?

## 2021-11-05 ENCOUNTER — Other Ambulatory Visit: Payer: Self-pay | Admitting: Internal Medicine

## 2021-11-21 ENCOUNTER — Other Ambulatory Visit: Payer: Self-pay | Admitting: Internal Medicine

## 2021-12-30 ENCOUNTER — Other Ambulatory Visit: Payer: Self-pay | Admitting: Internal Medicine

## 2022-02-27 ENCOUNTER — Other Ambulatory Visit: Payer: Self-pay | Admitting: Internal Medicine

## 2022-03-24 ENCOUNTER — Other Ambulatory Visit: Payer: Self-pay | Admitting: Cardiovascular Disease

## 2022-04-11 ENCOUNTER — Telehealth: Payer: Self-pay | Admitting: Cardiovascular Disease

## 2022-04-11 NOTE — Telephone Encounter (Signed)
*  STAT* If patient is at the pharmacy, call can be transferred to refill team.   1. Which medications need to be refilled? (please list name of each medication and dose if known)   amLODipine (NORVASC) 5 MG tablet   2. Which pharmacy/location (including street and city if local pharmacy) is medication to be sent to?  [place], [address]  3. Do they need a 30 day or 90 day supply?  90 day  Patient stated that she has 20 pills left.  Patient has an appointment scheduled on 11/27.

## 2022-04-12 MED ORDER — AMLODIPINE BESYLATE 5 MG PO TABS: 5.0000 mg | ORAL_TABLET | Freq: Every day | ORAL | 0 refills | Status: DC

## 2022-04-15 ENCOUNTER — Ambulatory Visit: Payer: Medicare Other

## 2022-04-15 ENCOUNTER — Other Ambulatory Visit: Payer: Medicare Other

## 2022-04-21 ENCOUNTER — Encounter: Payer: Self-pay | Admitting: Student

## 2022-04-21 ENCOUNTER — Other Ambulatory Visit: Payer: Self-pay

## 2022-04-21 ENCOUNTER — Ambulatory Visit (INDEPENDENT_AMBULATORY_CARE_PROVIDER_SITE_OTHER): Payer: Medicare Other | Admitting: Student

## 2022-04-21 ENCOUNTER — Ambulatory Visit (HOSPITAL_COMMUNITY)
Admission: RE | Admit: 2022-04-21 | Discharge: 2022-04-21 | Disposition: A | Discharge status: Home / Self Care | Payer: Medicare Other | Source: Ambulatory Visit | Attending: Student in an Organized Health Care Education/Training Program | Admitting: Student in an Organized Health Care Education/Training Program

## 2022-04-21 VITALS — BP 155/75 | HR 66 | Temp 98.1°F | Ht 62.0 in | Wt 163.1 lb

## 2022-04-21 DIAGNOSIS — Z0000 Encounter for general adult medical examination without abnormal findings: Secondary | ICD-10-CM

## 2022-04-21 DIAGNOSIS — R2689 Other abnormalities of gait and mobility: Secondary | ICD-10-CM | POA: Diagnosis not present

## 2022-04-21 DIAGNOSIS — M19012 Primary osteoarthritis, left shoulder: Secondary | ICD-10-CM | POA: Insufficient documentation

## 2022-04-21 DIAGNOSIS — Z9181 History of falling: Secondary | ICD-10-CM

## 2022-04-21 DIAGNOSIS — R600 Localized edema: Secondary | ICD-10-CM | POA: Diagnosis not present

## 2022-04-21 DIAGNOSIS — Z23 Encounter for immunization: Secondary | ICD-10-CM | POA: Diagnosis not present

## 2022-04-21 DIAGNOSIS — W19XXXA Unspecified fall, initial encounter: Secondary | ICD-10-CM | POA: Insufficient documentation

## 2022-04-21 DIAGNOSIS — R0781 Pleurodynia: Secondary | ICD-10-CM | POA: Diagnosis not present

## 2022-04-21 DIAGNOSIS — M19072 Primary osteoarthritis, left ankle and foot: Secondary | ICD-10-CM | POA: Diagnosis not present

## 2022-04-21 DIAGNOSIS — M1612 Unilateral primary osteoarthritis, left hip: Secondary | ICD-10-CM | POA: Diagnosis not present

## 2022-04-21 DIAGNOSIS — M25512 Pain in left shoulder: Secondary | ICD-10-CM | POA: Diagnosis not present

## 2022-04-21 DIAGNOSIS — Z043 Encounter for examination and observation following other accident: Secondary | ICD-10-CM | POA: Diagnosis not present

## 2022-04-21 NOTE — Patient Instructions (Signed)
[name], it was a pleasure seeing you today!  Today we discussed: - Fall: I am so sorry you fell and are experiencing this pain. I am going to get some x-rays to see if anything is broken. In the meantime, continue taking the medication you have been taking, maximum 2 tablets daily. If your pain worsens or is not tolerable, please call the clinic.  Follow-up:  if symptoms do not improve    Please make sure to arrive 15 minutes prior to your next appointment. If you arrive late, you may be asked to reschedule.   We look forward to seeing you next time. Please call our clinic at [phone] if you have any questions or concerns. The best time to call is Monday-Friday from 9am-4pm, but there is someone available 24/7. If after hours or the weekend, call the main hospital number and ask for the Internal Medicine Resident On-Call. If you need medication refills, please notify your pharmacy one week in advance and they will send [place] a request.  Thank you for letting us take part in your care. Wishing you the best!  Thank you, Sanjuan Dame, MD

## 2022-04-25 DIAGNOSIS — Z9181 History of falling: Secondary | ICD-10-CM | POA: Insufficient documentation

## 2022-04-25 NOTE — Assessment & Plan Note (Signed)
Flu vaccine given today. 

## 2022-04-25 NOTE — Progress Notes (Signed)
CC: recent fall  HPI:  Alexis Kline is a 76 y.o. person with medical history as below presenting to [facility] for recent fall.  Please see problem-based list for further details, assessments, and plans.  Past Medical History:  Diagnosis Date   Acute pain of left shoulder 02/22/2017   APPENDECTOMY, HX OF 07/10/2006   Qualifier: Diagnosis of  By: Oretha Ellis     Arthritis    back, hands, legs , feet    CAD (coronary artery disease) 2007   nonobstructive, 30% LAD 02/2006   COPD (chronic obstructive pulmonary disease) ([place])    Diarrhea 04/12/2019   Fever 04/10/2020   Fibroadenoma 2008   right breast - based on [place] 09/2006   GERD (gastroesophageal reflux disease)    meds helps    Hemorrhoids    HLD (hyperlipidemia)    Hypertension    HYSTERECTOMY, HX OF 07/10/2006   Qualifier: Diagnosis of  By: Oretha Ellis     Left flank pain 07/04/2019   Myocardial infarct, old    Pleuritic chest pain 01/01/2014   01/01/14: 4-5 month history of "clawing" sensation in her right lung that has worsened over the last month without an obvious trigger 01/02/14: CTA negative for PE   Stable angina    chronic    Stroke [facility])    many years ago ~ 30 yrs per pt    Review of Systems:  As per HPI  Physical Exam:  Vitals:   04/21/22 1351  BP: (!) 155/75  Pulse: 66  Temp: 98.1 F (36.7 C)  TempSrc: Oral  SpO2: 94%  Weight: 163 lb 1.6 oz (74 kg)  Height: '5\' 2"'$  (1.575 m)   General: Appears uncomfortable, no acute distress CV: Regular rate, rhythm. Warm extremities. Pulm: Normal work of breathing on room air. MSK: Tenderness to palpation of L third toe with limited range of motion 2/2 pain. Exquisitely tender to palpation on L chest wall. L shoulder without crepitus. L shoulder with extremely limited range of motion, minimal abduction, adduction, flexion, extension. No tenderness on bicep insertion site or sub-acromial space. Gait unsteady, minimal swing with L leg. Skin: Ecchymoses  present on L lateral thigh. Neuro: Awake, alert, conversing appropriately.   Assessment & Plan:   History of recent fall Alexis Kline is presenting to clinic after a recent fall earlier this week. A few days ago she reports getting up in the middle of the night to go to the bathroom. As she was sitting down on the toilet, she realized she was starting to fall on the side of the commode. Thankfully, she was able to catch herself before she hit her head on the side of the tub. She reports hitting her hip on the floor and her side/shoulder by the tub. States her pain was so significant she could not move from the bathroom floor for almost an hour. She was eventually able to get her self up and go back to bed. She denies any dizziness, lightheadedness, loss of consciousness, chest pain, dyspnea, nausea, vomiting. Since the fall, she has been taking Bayer back & body twice daily, which has been controlling her symptoms.   On examination, Alexis Kline is extremely tender on the side of her chest and her gait is also more unsteady than typical. I am concerned of a possible fracture, we will plan for imaging today. I have discussed with Alexis Kline to not take her regular aspirin '81mg'$  daily when she is taking the over the  counter medication. She verbalized understanding.   - XR L shoulder, ribs, hip, foot - Continue aspirin-caffeine twice daily x1 week - Hold aspirin '81mg'$  daily while taking this  Healthcare maintenance - Flu vaccine given today   Patient discussed with Dr.  Garnet Koyanagi, MD Internal Medicine PGY-3 Pager: [phone]

## 2022-04-25 NOTE — Assessment & Plan Note (Addendum)
Alexis Kline is presenting to clinic after a recent fall earlier this week. A few days ago she reports getting up in the middle of the night to go to the bathroom. As she was sitting down on the toilet, she realized she was starting to fall on the side of the commode. Thankfully, she was able to catch herself before she hit her head on the side of the tub. She reports hitting her hip on the floor and her side/shoulder by the tub. States her pain was so significant she could not move from the bathroom floor for almost an hour. She was eventually able to get her self up and go back to bed. She denies any dizziness, lightheadedness, loss of consciousness, chest pain, dyspnea, nausea, vomiting. Since the fall, she has been taking Bayer back & body twice daily, which has been controlling her symptoms.   On examination, Alexis Kline is extremely tender on the side of her chest and her gait is also more unsteady than typical. I am concerned of a possible fracture, we will plan for imaging today. I have discussed with Alexis Kline to not take her regular aspirin '81mg'$  daily when she is taking the over the counter medication. She verbalized understanding.   - XR L shoulder, ribs, hip, foot - Continue aspirin-caffeine twice daily x1 week - Hold aspirin '81mg'$  daily while taking this

## 2022-04-28 NOTE — Progress Notes (Signed)
Internal Medicine Clinic Attending ? ?Case discussed with Dr. Braswell  At the time of the visit.  We reviewed the resident?s history and exam and pertinent patient test results.  I agree with the assessment, diagnosis, and plan of care documented in the resident?s note.  ?

## 2022-05-02 ENCOUNTER — Ambulatory Visit: Payer: Medicare Other

## 2022-05-15 NOTE — Progress Notes (Signed)
Cardiology Clinic Note   Patient Name: Alexis Kline Date of Encounter: 05/16/2022  Primary Care Provider:  Farrel Gordon, DO Primary Cardiologist:  Shelva Majestic, MD  Patient Profile    Alexis Kline 76 year old female presents to the clinic today for follow-up evaluation of her essential hypertension, stable angina, and hyperlipidemia.  Past Medical History    Past Medical History:  Diagnosis Date   Acute pain of left shoulder 02/22/2017   APPENDECTOMY, HX OF 07/10/2006   Qualifier: Diagnosis of  By: Oretha Ellis     Arthritis    back, hands, legs , feet    CAD (coronary artery disease) 2007   nonobstructive, 30% LAD 02/2006   COPD (chronic obstructive pulmonary disease) ([place])    Diarrhea 04/12/2019   Fever 04/10/2020   Fibroadenoma 2008   right breast - based on [place] 09/2006   GERD (gastroesophageal reflux disease)    meds helps    Hemorrhoids    HLD (hyperlipidemia)    Hypertension    HYSTERECTOMY, HX OF 07/10/2006   Qualifier: Diagnosis of  By: Jiles Crocker DO, Elizabeth     Left flank pain 07/04/2019   Myocardial infarct, old    Pleuritic chest pain 01/01/2014   01/01/14: 4-5 month history of "clawing" sensation in her right lung that has worsened over the last month without an obvious trigger 01/02/14: CTA negative for PE   Stable angina    chronic    Stroke [facility])    many years ago ~ 30 yrs per pt    Past Surgical History:  Procedure Laterality Date   [place]   BREAST EXCISIONAL BIOPSY Right 1988   BREAST LUMPECTOMY Left    right breat   [place]   3 operations in right hand and 2 operations in left hand. Dr. Redmond Pulling in [place]   COLONOSCOPY  02/04/2015   [facility]   COLONOSCOPY  08/17/2020   [facility]   POLYPECTOMY     PTCA  2007   rectal stapling  2008   Done by Dr. Marlou Starks   RECTAL SURGERY  01/07/2007   Dr. Marlou Starks in [place].    SHOULDER SURGERY  Bilateral 1991   Right shoulder surgery in 1991 and left shoulder surgery in 1992 by Dr. Redmond Pulling in [place].    Allergies  Allergies  Allergen Reactions   [name] Isothiocyanate] Shortness Of Breath   Iodine     Skin discoloration (turns orange)   Other     Aloe Vera and mustard "causes me to faint"   Tape     History of Present Illness    Alexis Kline is a PMH of HTN, HLD, CAD, prior tobacco abuse (55 years), and GERD.  She noted a bandlike chest discomfort that did not radiate occurred at rest lasting for 15-20 minutes with spontaneous resolution.  She reported a history of chronic stable angina underwent cardiac catheterization in 2012.  She was noted to have 30% segmental LAD disease.  Smoking cessation was advised at that time.  Medical management was recommended.  She also previously noted palpitations.  She had been drinking around 8 cups of coffee per day or more.  When she was initially seen by Dr. Claiborne Billings significant reduction of caffeine was reviewed.  Blood pressure was slightly elevated.  For her chest discomfort increasing her carvedilol to 18.75 mg twice daily was recommended.  Her echocardiogram 5/18 showed hyperdynamic LV function with an EF of 65-70%, G1 DD, mild mitral annular calcification.  She underwent nuclear stress testing which showed an EF of 81%.  No ischemic changes were noted.  She was seen in follow-up by Dr. Claiborne Billings 11/18.  She was noted to have significant stress related to her daughter who had been diagnosed with stage IV lung cancer 05-May-2023.  Her daughter passed away 2023/07/05.  She was seen in follow-up by Dr. Claiborne Billings 9/19.  During that time she reported labile blood pressures.  She had been on HCTZ 25 mg and carvedilol 25 mg twice daily.  During her evaluation blood pressure was noted to be 201/103.  Amlodipine 5 mg was added to her medication regimen.  She was seen in follow-up by Bunnie Domino, DNP 11/19.  Her blood pressure had significantly  improved and was noted to be 118/68.  She was last seen by Dr. Claiborne Billings on 12/22/2020.  She remained stable from a cardiac standpoint.  She continues to walk with a cane.  She denied chest pain.  She denied shortness of breath and palpitations.  She continued to refrain from tobacco use.  She underwent colonoscopy 2/22 and was found to have 3 polyps which were removed.  Her blood pressure was well-controlled at 125/61.  Her EKG showed sinus bradycardia 56 bpm with poor R wave progression V1 through V4 no ectopy was noted.  Follow-up was planned for 1 year.  She presents to the clinic today for follow-up evaluation and states she had a recent fall.  She got up in the middle of the night to use the restroom and woke up on the bathroom floor.  She presented in for follow-up and had multiple x-rays done.  She did not sustain fractures.  She continues to note occasional episodes of palpitations but is not sure that she has palpitations during her episode where she fell.  She notes that during her palpitations they resolve in 1 to 2 minutes but happen 3-4 times per month.  Her blood pressure is well-controlled today and 120s over 70s.  She does note some labile blood pressure at home.  I will.  A 14-day cardiac event monitor, order a magnesium, CBC, and BMP.  I have asked her to increase her p.o. hydration and change positions slowly allowing time to acclimate before she changes position.  She expressed understanding.  We will plan follow-up in 6 to 8 weeks.  Today she denies chest pain, shortness of breath, lower extremity edema, fatigue, palpitations, melena, hematuria, hemoptysis, diaphoresis, weakness, presyncope, syncope, orthopnea, and PND.   Home Medications    Prior to Admission medications   Medication Sig Start Date End Date Taking? Authorizing Provider  amLODipine (NORVASC) 5 MG tablet Take 1 tablet (5 mg total) by mouth daily. SCHEDULE OFFICE VISIT FOR FUTURE REFILLS. 3RD ATTEMPT. 04/12/22   Troy Sine, MD  aspirin 81 MG EC tablet Take 1 tablet (81 mg total) by mouth daily. 07/22/15   Burns, Arloa Koh, MD  Aspirin-Caffeine (BAYER BACK & BODY) 500-32.5 MG TABS Take by mouth as needed.    [provider]  atorvastatin (LIPITOR) 40 MG tablet TAKE ONE TABLET BY MOUTH DAILY 12/31/21   Farrel Gordon, DO  B Complex-Biotin-FA (SUPER B-COMPLEX PO) Take 1 tablet by mouth daily.    [provider]  Calcium Carbonate-Vit D-Min (CALTRATE 600+D PLUS MINERALS) 600-800 MG-UNIT CHEW Chew 1 tablet by mouth in the morning and at bedtime.  [provider]  carvedilol (COREG) 25 MG tablet Take 0.5 tablets (12.5 mg total) by mouth 2 (two) times daily with a meal. 08/10/21   Demaio, Alexa, MD  omeprazole (PRILOSEC) 40 MG capsule TAKE ONE CAPSULE BY MOUTH TWICE DAILY 03/01/22   Johny Blamer, DO  senna-docusate (SENNALAX-S) 8.6-50 MG tablet Take 2 tablets by mouth 2 (two) times daily. 10/23/20   Lyndal Pulley, MD    Family History    Family History  Problem Relation Age of Onset   Hyperlipidemia Mother    Hypertension Mother    Hyperlipidemia Father    Hypertension Father    Heart disease Sister    Diabetes Sister    Stomach cancer Sister    Leukemia Brother    Stroke Neg Hx    Colon cancer Neg Hx    Colon polyps Neg Hx    Esophageal cancer Neg Hx    Rectal cancer Neg Hx    She indicated that her mother is deceased. She indicated that her father is deceased. She indicated that both of her sisters are deceased. She indicated that only one of her four brothers is alive. She indicated that the status of her neg hx is unknown.  Social History    Social History   Socioeconomic History   Marital status: Widowed    Spouse name: Not on file   Number of children: Not on file   Years of education: Not on file   Highest education level: Not on file  Occupational History   Not on file  Tobacco Use   Smoking status: Former    Years: 54.00    Types: Cigarettes    Quit date:  06/20/2013    Years since quitting: 8.9   Smokeless tobacco: Never  Substance and Sexual Activity   Alcohol use: Yes    Alcohol/week: 0.0 standard drinks of alcohol    Comment: rarely "maybe a drink of New Year's"   Drug use: No   Sexual activity: Not Currently  Other Topics Concern   Not on file  Social History Narrative   Not on file   Social Determinants of Health   Financial Resource Strain: Low Risk  (10/21/2021)   Overall Financial Resource Strain (CARDIA)    Difficulty of Paying Living Expenses: Not hard at all  Food Insecurity: No Food Insecurity (10/21/2021)   Hunger Vital Sign    Worried About Running Out of Food in the Last Year: Never true    [place] in the Last Year: Never true  Transportation Needs: No Transportation Needs (10/21/2021)   PRAPARE - Hydrologist (Medical): No    Lack of Transportation (Non-Medical): No  Physical Activity: Sufficiently Active (10/21/2021)   Exercise Vital Sign    Days of Exercise per Week: 3 days    Minutes of Exercise per Session: 90 min  Stress: No Stress Concern Present (10/21/2021)   [place]    Feeling of Stress : Only a little  Social Connections: Socially Isolated (10/21/2021)   Social Connection and Isolation Panel [NHANES]    Frequency of Communication with Friends and Family: Once a week    Frequency of Social Gatherings with Friends and Family: Never    Attends Religious Services: Never    Marine scientist or Organizations: No    Attends Archivist Meetings: Never    Marital Status: Widowed  Intimate Partner Violence: Not  At Risk (10/21/2021)   Humiliation, Afraid, Rape, and Kick questionnaire    Fear of Current or Ex-Partner: No    Emotionally Abused: No    Physically Abused: No    Sexually Abused: No     Review of Systems    General:  No chills, fever, night sweats or weight changes.  Cardiovascular:  No  chest pain, dyspnea on exertion, edema, orthopnea, palpitations, paroxysmal nocturnal dyspnea. Dermatological: No rash, lesions/masses Respiratory: No cough, dyspnea Urologic: No hematuria, dysuria Abdominal:   No nausea, vomiting, diarrhea, bright red blood per rectum, melena, or hematemesis Neurologic:  No visual changes, wkns, changes in mental status. All other systems reviewed and are otherwise negative except as noted above.  Physical Exam    VS:  BP 126/74 (BP Location: Left Arm, Patient Position: Sitting, Cuff Size: Large)   Pulse 75   Ht '5\' 2"'$  (1.575 m)   Wt 161 lb 6.4 oz (73.2 kg)   LMP 06/20/1986   BMI 29.52 kg/m  , BMI Body mass index is 29.52 kg/m. GEN: Well nourished, well developed, in no acute distress. HEENT: normal. Neck: Supple, no JVD, carotid bruits, or masses. Cardiac: RRR, no murmurs, rubs, or gallops. No clubbing, cyanosis, edema.  Radials/DP/PT 2+ and equal bilaterally.  Respiratory:  Respirations regular and unlabored, clear to auscultation bilaterally. GI: Soft, nontender, nondistended, BS + x 4. MS: no deformity or atrophy. Skin: warm and dry, no rash. Neuro:  Strength and sensation are intact. Psych: Normal affect.  Accessory Clinical Findings    Recent Labs: 08/10/2021: ALT 19; BUN 18; Creatinine, Ser 0.74; Hemoglobin 12.7; Platelets 291; Potassium 4.5; Sodium 141   Recent Lipid Panel    Component Value Date/Time   CHOL 160 01/11/2021 1410   TRIG 211 (H) 01/11/2021 1410   HDL 45 01/11/2021 1410   CHOLHDL 3.6 01/11/2021 1410   CHOLHDL 3.8 01/01/2014 1433   VLDL 38 01/01/2014 1433   LDLCALC 80 01/11/2021 1410         ECG personally reviewed by me today-normal sinus rhythm low voltage QRS cannot rule out anterior infarct 68 bpm- No acute changes  Echocardiogram 11/09/2016  Study Conclusions   - Left ventricle: The cavity size was normal. Systolic function was    vigorous. The estimated ejection fraction was in the range of 65%    to  70%. Wall motion was normal; there were no regional wall    motion abnormalities. Doppler parameters are consistent with    abnormal left ventricular relaxation (grade 1 diastolic    dysfunction).  - Mitral valve: Calcified annulus.   -------------------------------------------------------------------  Study data:  Comparison was made to the study of 02/24/2006.  Study  status:  Routine.  Procedure:  Transthoracic echocardiography.  Image quality was adequate.  Study completion:  There were no  complications.          Transthoracic echocardiography.  M-mode,  complete 2D, spectral Doppler, and color Doppler.  Birthdate:  Patient birthdate: [birth year].  Age:  Patient is 76 yr old.  Sex:  Gender: female.    BMI: 29.7 kg/m^2.  Blood pressure:     124/81  Patient status:  Outpatient.  Study date:  Study date: 11/09/2016.  Study time: 02:16 PM.  Location:  [place] [facility]   -------------------------------------------------------------------   -------------------------------------------------------------------  Left ventricle:  The cavity size was normal. Systolic function was  vigorous. The estimated ejection fraction was in the range of 65%  to 70%. Wall motion was normal; there were  no regional wall motion  abnormalities. Doppler parameters are consistent with abnormal left  ventricular relaxation (grade 1 diastolic dysfunction).   -------------------------------------------------------------------  Aortic valve:   Trileaflet; normal thickness leaflets. Mobility was  not restricted.  Doppler:  Transvalvular velocity was within the  normal range. There was no stenosis. There was no regurgitation.    -------------------------------------------------------------------  Aorta: Aortic root: The aortic root was normal in size.   -------------------------------------------------------------------  Mitral valve:   Calcified annulus. Mobility was not restricted.  Doppler:  Transvalvular  velocity was within the normal range. There  was no evidence for stenosis. There was no regurgitation.    Peak  gradient (D): 3 mm Hg.   -------------------------------------------------------------------  Left atrium:  The atrium was normal in size.   -------------------------------------------------------------------  Right ventricle:  The cavity size was normal. Wall thickness was  normal. Systolic function was normal.   -------------------------------------------------------------------  Pulmonic valve:   Poorly visualized.  Structurally normal valve.  Cusp separation was normal.  Doppler:  Transvalvular velocity was  within the normal range. There was no evidence for stenosis. There  was no regurgitation.   -------------------------------------------------------------------  Tricuspid valve:   Structurally normal valve.    Doppler:  Transvalvular velocity was within the normal range. There was no  regurgitation.   -------------------------------------------------------------------  Pulmonary artery:   The main pulmonary artery was normal-sized.  Systolic pressure was within the normal range.   -------------------------------------------------------------------  Right atrium:  The atrium was normal in size.   -------------------------------------------------------------------  Pericardium: There was no pericardial effusion.   -------------------------------------------------------------------  Systemic veins:  Inferior vena cava: The vessel was normal in size.   Assessment & Plan   1.  Palpitations-notices intermittent episodes of palpitations.  Notices 3-4 episodes per month.  Had recent follow-up.  Will evaluate for arrhythmia. CBC, BMP, magnesium Cardiac event monitor-14-day  Essential hypertension-BP today 126/74.  Well-controlled at home. Continue carvedilol, HCTZ, amlodipine Heart healthy low-sodium diet-salty 6 given Increase physical activity as  tolerated Order BMP, CBC  Hyperlipidemia-LDL 80 on 01/11/2021.  Compliant with atorvastatin.  LDL goal less than 70. Continue atorvastatin Heart healthy low-sodium high-fiber diet.   Increase physical activity as tolerated  Coronary artery disease-no chest pain today.  Denies recent episodes of arm neck back or chest discomfort.  Underwent cardiac catheterization in 2012 which showed mild nonobstructive coronary disease.  Underwent subsequent stress testing 5/18 which showed normal perfusion. Continue atorvastatin and amlodipine, carvedilol Heart healthy low-sodium diet-salty 6 given Increase physical activity as tolerated  GERD-compliant with omeprazole.  Asymptomatic. Follows with PCP  History of tobacco abuse-continues tobacco cessation. Congratulated on continued cessation.  Disposition: Follow-up with Dr. Claiborne Billings or me in 6 to 8 weeks   Jossie Ng. Christee Mervine NP-C     05/16/2022, 4:22 PM [place] [address] Office [phone] Fax [phone]    I spent 14 minutes examining this patient, reviewing medications, and using patient centered shared decision making involving her cardiac care.  Prior to her visit I spent greater than 20 minutes reviewing her past medical history,  medications, and prior cardiac tests.

## 2022-05-16 ENCOUNTER — Encounter: Payer: Self-pay | Admitting: General Practice

## 2022-05-16 ENCOUNTER — Ambulatory Visit: Payer: Medicare Other | Attending: General Practice | Admitting: General Practice

## 2022-05-16 ENCOUNTER — Ambulatory Visit: Payer: Medicare Other | Attending: General Practice

## 2022-05-16 VITALS — BP 126/74 | HR 75 | Ht 62.0 in | Wt 161.4 lb

## 2022-05-16 DIAGNOSIS — E782 Mixed hyperlipidemia: Secondary | ICD-10-CM | POA: Diagnosis not present

## 2022-05-16 DIAGNOSIS — R002 Palpitations: Secondary | ICD-10-CM | POA: Diagnosis not present

## 2022-05-16 DIAGNOSIS — I2510 Atherosclerotic heart disease of native coronary artery without angina pectoris: Secondary | ICD-10-CM

## 2022-05-16 DIAGNOSIS — I10 Essential (primary) hypertension: Secondary | ICD-10-CM | POA: Diagnosis not present

## 2022-05-16 DIAGNOSIS — Z87891 Personal history of nicotine dependence: Secondary | ICD-10-CM | POA: Diagnosis not present

## 2022-05-16 DIAGNOSIS — K219 Gastro-esophageal reflux disease without esophagitis: Secondary | ICD-10-CM | POA: Diagnosis not present

## 2022-05-16 NOTE — Patient Instructions (Signed)
Medication Instructions:  The current medical regimen is effective;  continue present plan and medications as directed. Please refer to the Current Medication list given to you today.  *If you need a refill on your cardiac medications before your next appointment, please call your pharmacy*  Lab Work: BMET, CBC AND [place] If you have labs (blood work) drawn today and your tests are completely normal, you will receive your results only by:  [place] [facility] (if you have MyChart) OR  A paper copy in the mail If you have any lab test that is abnormal or we need to change your treatment, we will call you to review the results.  Testing/Procedures: NONE  Other Instructions Your physician has requested you wear a ZIO patch monitor for 14 days.   MAINTAIN HYDRATION  ALLOW 1-2 MINUTES UPON STANDING BEFORE AMBULATION  Follow-Up: At [facility], you and your health needs are our priority.  As part of our continuing mission to provide you with exceptional heart care, we have created designated Provider Care Teams.  These Care Teams include your primary Cardiologist (physician) and Advanced Practice Providers (APPs -  Physician Assistants and Nurse Practitioners) who all work together to provide you with the care you need, when you need it.  Your next appointment:   6-8 week(s)  The format for your next appointment:   In Person  Provider:   Coletta Memos, FNP-C   Important Information About Alexis Kline Monitor Instructions  Your physician has requested you wear a ZIO patch monitor for 14 days.  This is a single patch monitor. [employer] supplies one patch monitor per enrollment. Additional stickers are not available. Please do not apply patch if you will be having a Nuclear Stress Test,  Echocardiogram, Cardiac CT, MRI, or Chest Xray during the period you would be wearing the  monitor. The patch cannot be worn during these tests. You cannot remove and re-apply  the  ZIO XT patch monitor.  Your ZIO patch monitor will be mailed 3 day USPS to your address on file. It may take 3-5 days  to receive your monitor after you have been enrolled.  Once you have received your monitor, please review the enclosed instructions. Your monitor  has already been registered assigning a specific monitor serial # to you.  Billing and Patient Assistance Program Information  We have supplied [employer] with any of your insurance information on file for billing purposes. [employer] offers a sliding scale Patient Assistance Program for patients that do not have  insurance, or whose insurance does not completely cover the cost of the ZIO monitor.  You must apply for the Patient Assistance Program to qualify for this discounted rate.  To apply, please call [employer] at [phone], select option 4, select option 2, ask to apply for  Patient Assistance Program. Theodore Demark will ask your household income, and how many people  are in your household. They will quote your out-of-pocket cost based on that information.  [employer] will also be able to set up a 29-month interest-free payment plan if needed.  Applying the monitor   Shave hair from upper left chest.  Hold abrader disc by orange tab. Rub abrader in 40 strokes over the upper left chest as  indicated in your monitor instructions.  Clean area with 4 enclosed alcohol pads. Let dry.  Apply patch as indicated in monitor instructions. Patch will be placed under collarbone on left  side of chest  with arrow pointing upward.  Rub patch adhesive wings for 2 minutes. Remove white label marked "1". Remove the white  label marked "2". Rub patch adhesive wings for 2 additional minutes.  While looking in a mirror, press and release button in K[place] of patch. A small green light will  flash 3-4 times. This will be your only indicator that the monitor has been turned on.  Do not shower for the first 24 hours. You may shower after the first 24  hours.  Press the button if you feel a symptom. You will hear a small click. Record Date, Time and  Symptom in the Patient Logbook.  When you are ready to remove the patch, follow instructions on the last 2 pages of Patient  Logbook. Stick patch monitor onto the last page of Patient Logbook.  Place Patient Logbook in the blue and white box. Use locking tab on box and tape box closed  securely. The blue and white box has prepaid postage on it. Please place it in the mailbox as  soon as possible. Your physician should have your test results approximately 7 days after the  monitor has been mailed back to Alexis Kline.  Call [facility] at [phone] if you have questions regarding  your ZIO XT patch monitor. Call them immediately if you see an orange light blinking on your  monitor.  If your monitor falls off in less than 4 days, contact our Monitor department at [phone].  If your monitor becomes loose or falls off after 4 days call [employer] at [phone] for  suggestions on securing your monitor

## 2022-05-16 NOTE — Progress Notes (Unsigned)
Enrolled for [employer] to mail a ZIO XT long term holter monitor to the patients address on file.   Dr. Claiborne Billings to read.

## 2022-05-17 ENCOUNTER — Other Ambulatory Visit: Payer: Self-pay

## 2022-05-17 LAB — CBC
Hematocrit: 38.7 % (ref 34.0–46.6)
Hemoglobin: 12.4 g/dL (ref 11.1–15.9)
MCH: 26.1 pg — ABNORMAL LOW (ref 26.6–33.0)
MCHC: 32 g/dL (ref 31.5–35.7)
MCV: 82 fL (ref 79–97)
Platelets: 236 10*3/uL (ref 150–450)
RBC: 4.75 x10E6/uL (ref 3.77–5.28)
RDW: 14.1 % (ref 11.7–15.4)
WBC: 6.3 10*3/uL (ref 3.4–10.8)

## 2022-05-17 LAB — BASIC METABOLIC PANEL
BUN/Creatinine Ratio: 16 (ref 12–28)
BUN: 12 mg/dL (ref 8–27)
CO2: 24 mmol/L (ref 20–29)
Calcium: 9.9 mg/dL (ref 8.7–10.3)
Chloride: 109 mmol/L — ABNORMAL HIGH (ref 96–106)
Creatinine, Ser: 0.73 mg/dL (ref 0.57–1.00)
Glucose: 98 mg/dL (ref 70–99)
Potassium: 4.4 mmol/L (ref 3.5–5.2)
Sodium: 145 mmol/L — ABNORMAL HIGH (ref 134–144)
eGFR: 86 mL/min/{1.73_m2} (ref >59)

## 2022-05-17 LAB — MAGNESIUM: Magnesium: 1.7 mg/dL (ref 1.6–2.3)

## 2022-05-17 MED ORDER — AMLODIPINE BESYLATE 5 MG PO TABS: 5.0000 mg | ORAL_TABLET | Freq: Every day | ORAL | 2 refills | Status: DC

## 2022-05-20 ENCOUNTER — Telehealth: Payer: Self-pay | Admitting: General Practice

## 2022-05-20 NOTE — Telephone Encounter (Signed)
Pt informed of providers message. Pt verbalized understanding. She will put it on after. She will call if she needs anything futher

## 2022-05-20 NOTE — Telephone Encounter (Signed)
Pt states she just received her zio monitor. Pt has a mammogram on the 05/26/22. They told her she should wait until after to put monitor, pt wants to know if this is okay .

## 2022-05-24 ENCOUNTER — Other Ambulatory Visit: Payer: Self-pay

## 2022-05-24 MED ORDER — OMEPRAZOLE 40 MG PO CPDR: 40.0000 mg | DELAYED_RELEASE_CAPSULE | Freq: Two times a day (BID) | ORAL | 0 refills | Status: DC

## 2022-05-24 NOTE — Telephone Encounter (Signed)
  omeprazole (PRILOSEC) 40 MG capsule, refill request for 90 days supply @  [place], [place]

## 2022-05-26 ENCOUNTER — Ambulatory Visit
Admission: RE | Admit: 2022-05-26 | Discharge: 2022-05-26 | Disposition: A | Discharge status: Home / Self Care | Payer: Medicare Other | Source: Ambulatory Visit | Attending: Internal Medicine | Admitting: Internal Medicine

## 2022-05-26 DIAGNOSIS — Z1231 Encounter for screening mammogram for malignant neoplasm of breast: Secondary | ICD-10-CM

## 2022-05-26 DIAGNOSIS — Z0000 Encounter for general adult medical examination without abnormal findings: Secondary | ICD-10-CM

## 2022-05-26 DIAGNOSIS — R002 Palpitations: Secondary | ICD-10-CM | POA: Diagnosis not present

## 2022-06-14 DIAGNOSIS — R002 Palpitations: Secondary | ICD-10-CM | POA: Diagnosis not present

## 2022-06-21 ENCOUNTER — Other Ambulatory Visit: Payer: Self-pay

## 2022-06-21 DIAGNOSIS — R011 Cardiac murmur, unspecified: Secondary | ICD-10-CM

## 2022-06-21 MED ORDER — APIXABAN 5 MG PO TABS: 5.0000 mg | ORAL_TABLET | Freq: Two times a day (BID) | ORAL | 3 refills | Status: DC

## 2022-06-27 NOTE — Progress Notes (Unsigned)
Cardiology Clinic Note   Patient Name: Alexis Kline Date of Encounter: 06/27/2022  Primary Care Provider:  Farrel Gordon, DO Primary Cardiologist:  Shelva Majestic, MD  Patient Profile    Alexis Kline 77 year old female presents to the clinic today for follow-up evaluation of her essential hypertension, stable angina, and hyperlipidemia.  Past Medical History    Past Medical History:  Diagnosis Date   Acute pain of left shoulder 02/22/2017   APPENDECTOMY, HX OF 07/10/2006   Qualifier: Diagnosis of  By: Oretha Ellis     Arthritis    back, hands, legs , feet    CAD (coronary artery disease) 2007   nonobstructive, 30% LAD 02/2006   COPD (chronic obstructive pulmonary disease) ([place])    Diarrhea 04/12/2019   Fever 04/10/2020   Fibroadenoma 2008   right breast - based on [place] 09/2006   GERD (gastroesophageal reflux disease)    meds helps    Hemorrhoids    HLD (hyperlipidemia)    Hypertension    HYSTERECTOMY, HX OF 07/10/2006   Qualifier: Diagnosis of  By: Jiles Crocker DO, Elizabeth     Left flank pain 07/04/2019   Myocardial infarct, old    Pleuritic chest pain 01/01/2014   01/01/14: 4-5 month history of "clawing" sensation in her right lung that has worsened over the last month without an obvious trigger 01/02/14: CTA negative for PE   Stable angina    chronic    Stroke [facility])    many years ago ~ 30 yrs per pt    Past Surgical History:  Procedure Laterality Date   Terry   BREAST EXCISIONAL BIOPSY Right 1988   BREAST LUMPECTOMY Left    right breat   Jette   3 operations in right hand and 2 operations in left hand. Dr. Redmond Pulling in [place]   COLONOSCOPY  02/04/2015   [facility]   COLONOSCOPY  08/17/2020   DJ   POLYPECTOMY     PTCA  2007   rectal stapling  2008   Done by Dr. Marlou Starks   RECTAL SURGERY  01/07/2007   Dr. Marlou Starks in [place].    SHOULDER SURGERY  Bilateral 1991   Right shoulder surgery in 1991 and left shoulder surgery in 1992 by Dr. Redmond Pulling in [place].    Allergies  Allergies  Allergen Reactions   [name] Isothiocyanate] Shortness Of Breath   Iodine     Skin discoloration (turns orange)   Other     Aloe Vera and mustard "causes me to faint"   Tape     History of Present Illness    Alexis Kline is a PMH of HTN, HLD, CAD, prior tobacco abuse (55 years), and GERD.  She noted a bandlike chest discomfort that did not radiate occurred at rest lasting for 15-20 minutes with spontaneous resolution.  She reported a history of chronic stable angina underwent cardiac catheterization in 2012.  She was noted to have 30% segmental LAD disease.  Smoking cessation was advised at that time.  Medical management was recommended.  She also previously noted palpitations.  She had been drinking around 8 cups of coffee per day or more.  When she was initially seen by Dr. Claiborne Billings significant reduction of caffeine was reviewed.  Blood pressure was slightly elevated.  For her chest discomfort increasing her carvedilol to 18.75 mg twice daily was recommended.  Her echocardiogram 5/18 showed hyperdynamic LV function with an EF of 65-70%, G1 DD, mild mitral annular calcification.  She underwent nuclear stress testing which showed an EF of 81%.  No ischemic changes were noted.  She was seen in follow-up by Dr. Claiborne Billings 11/18.  She was noted to have significant stress related to her daughter who had been diagnosed with stage IV lung cancer 2023/04/23.  Her daughter passed away 23-Jun-2023.  She was seen in follow-up by Dr. Claiborne Billings 9/19.  During that time she reported labile blood pressures.  She had been on HCTZ 25 mg and carvedilol 25 mg twice daily.  During her evaluation blood pressure was noted to be 201/103.  Amlodipine 5 mg was added to her medication regimen.  She was seen in follow-up by Bunnie Domino, DNP 11/19.  Her blood pressure had significantly  improved and was noted to be 118/68.  She was last seen by Dr. Claiborne Billings on 12/22/2020.  She remained stable from a cardiac standpoint.  She continues to walk with a cane.  She denied chest pain.  She denied shortness of breath and palpitations.  She continued to refrain from tobacco use.  She underwent colonoscopy 2/22 and was found to have 3 polyps which were removed.  Her blood pressure was well-controlled at 125/61.  Her EKG showed sinus bradycardia 56 bpm with poor R wave progression V1 through V4 no ectopy was noted.  Follow-up was planned for 1 year.  She presented to the clinic 05/16/22 for follow-up evaluation and stated she had a recent fall.  She got up in the middle of the night to use the restroom and woke up on the bathroom floor.  She presented  for follow-up and had multiple x-rays done.  She did not sustain fractures.  She continued to note occasional episodes of palpitations but was not sure that she has palpitations during her episode when she fell.  She noted that during her palpitations they resolve in 1 to 2 minutes but happened 3-4 times per month.  Her blood pressure was well-controlled .  She did note some labile blood pressure at home.    A 14-day cardiac event monitor,  magnesium, CBC, and BMP were ordered.  I  asked her to increase her p.o. hydration and change positions slowly.  Follow-up was planned for 6 to 8 weeks.  Her cardiac event monitor showed 73 episodes of SVT, 6% A-fib burden, minimum heart rate of 48, maximum heart rate of 169 and a average heart rate of 73 with predominantly sinus rhythm.  I started her on apixaban and referred her to EP.  Her lab work from 05/16/2022 showed slightly elevated sodium and  stable lab work.  She presents to the clinic today for follow-up evaluation and states***  Today she denies chest pain, shortness of breath, lower extremity edema, fatigue, palpitations, melena, hematuria, hemoptysis, diaphoresis, weakness, presyncope, syncope,  orthopnea, and PND.   Home Medications    Prior to Admission medications   Medication Sig Start Date End Date Taking? Authorizing Provider  amLODipine (NORVASC) 5 MG tablet Take 1 tablet (5 mg total) by mouth daily. SCHEDULE OFFICE VISIT FOR FUTURE REFILLS. 3RD ATTEMPT. 04/12/22   Troy Sine, MD  aspirin 81 MG EC tablet Take 1 tablet (81 mg total) by mouth daily. 07/22/15   Burns, Arloa Koh, MD  Aspirin-Caffeine (BAYER BACK & BODY) 500-32.5 MG TABS Take by mouth as needed.    [provider]  atorvastatin (LIPITOR) 40 MG tablet  TAKE ONE TABLET BY MOUTH DAILY 12/31/21   Farrel Gordon, DO  B Complex-Biotin-FA (SUPER B-COMPLEX PO) Take 1 tablet by mouth daily.    [provider]  Calcium Carbonate-Vit D-Min (CALTRATE 600+D PLUS MINERALS) 600-800 MG-UNIT CHEW Chew 1 tablet by mouth in the morning and at bedtime.    [provider]  carvedilol (COREG) 25 MG tablet Take 0.5 tablets (12.5 mg total) by mouth 2 (two) times daily with a meal. 08/10/21   Demaio, Alexa, MD  omeprazole (PRILOSEC) 40 MG capsule TAKE ONE CAPSULE BY MOUTH TWICE DAILY 03/01/22   Johny Blamer, DO  senna-docusate (SENNALAX-S) 8.6-50 MG tablet Take 2 tablets by mouth 2 (two) times daily. 10/23/20   Lyndal Pulley, MD    Family History    Family History  Problem Relation Age of Onset   Hyperlipidemia Mother    Hypertension Mother    Hyperlipidemia Father    Hypertension Father    Heart disease Sister    Diabetes Sister    Stomach cancer Sister    Leukemia Brother    Stroke Neg Hx    Colon cancer Neg Hx    Colon polyps Neg Hx    Esophageal cancer Neg Hx    Rectal cancer Neg Hx    She indicated that her mother is deceased. She indicated that her father is deceased. She indicated that both of her sisters are deceased. She indicated that only one of her four brothers is alive. She indicated that the status of her neg hx is unknown.  Social History    Social History   Socioeconomic History    Marital status: Widowed    Spouse name: Not on file   Number of children: Not on file   Years of education: Not on file   Highest education level: Not on file  Occupational History   Not on file  Tobacco Use   Smoking status: Former    Years: 54.00    Types: Cigarettes    Quit date: 06/20/2013    Years since quitting: 9.0   Smokeless tobacco: Never  Substance and Sexual Activity   Alcohol use: Yes    Alcohol/week: 0.0 standard drinks of alcohol    Comment: rarely "maybe a drink of New Year's"   Drug use: No   Sexual activity: Not Currently  Other Topics Concern   Not on file  Social History Narrative   Not on file   Social Determinants of Health   Financial Resource Strain: Low Risk  (10/21/2021)   Overall Financial Resource Strain (CARDIA)    Difficulty of Paying Living Expenses: Not hard at all  Food Insecurity: No Food Insecurity (10/21/2021)   Hunger Vital Sign    Worried About Running Out of Food in the Last Year: Never true    [place] in the Last Year: Never true  Transportation Needs: No Transportation Needs (10/21/2021)   PRAPARE - Hydrologist (Medical): No    Lack of Transportation (Non-Medical): No  Physical Activity: Sufficiently Active (10/21/2021)   Exercise Vital Sign    Days of Exercise per Week: 3 days    Minutes of Exercise per Session: 90 min  Stress: No Stress Concern Present (10/21/2021)   [place]    Feeling of Stress : Only a little  Social Connections: Socially Isolated (10/21/2021)   Social Connection and Isolation Panel [NHANES]    Frequency of Communication with  Friends and Family: Once a week    Frequency of Social Gatherings with Friends and Family: Never    Attends Religious Services: Never    Marine scientist or Organizations: No    Attends Archivist Meetings: Never    Marital Status: Widowed  Intimate Partner Violence: Not At  Risk (10/21/2021)   Humiliation, Afraid, Rape, and Kick questionnaire    Fear of Current or Ex-Partner: No    Emotionally Abused: No    Physically Abused: No    Sexually Abused: No     Review of Systems    General:  No chills, fever, night sweats or weight changes.  Cardiovascular:  No chest pain, dyspnea on exertion, edema, orthopnea, palpitations, paroxysmal nocturnal dyspnea. Dermatological: No rash, lesions/masses Respiratory: No cough, dyspnea Urologic: No hematuria, dysuria Abdominal:   No nausea, vomiting, diarrhea, bright red blood per rectum, melena, or hematemesis Neurologic:  No visual changes, wkns, changes in mental status. All other systems reviewed and are otherwise negative except as noted above.  Physical Exam    VS:  LMP 06/20/1986  , BMI There is no height or weight on file to calculate BMI. GEN: Well nourished, well developed, in no acute distress. HEENT: normal. Neck: Supple, no JVD, carotid bruits, or masses. Cardiac: RRR, no murmurs, rubs, or gallops. No clubbing, cyanosis, edema.  Radials/DP/PT 2+ and equal bilaterally.  Respiratory:  Respirations regular and unlabored, clear to auscultation bilaterally. GI: Soft, nontender, nondistended, BS + x 4. MS: no deformity or atrophy. Skin: warm and dry, no rash. Neuro:  Strength and sensation are intact. Psych: Normal affect.  Accessory Clinical Findings    Recent Labs: 08/10/2021: ALT 19 05/16/2022: BUN 12; Creatinine, Ser 0.73; Hemoglobin 12.4; Magnesium 1.7; Platelets 236; Potassium 4.4; Sodium 145   Recent Lipid Panel    Component Value Date/Time   CHOL 160 01/11/2021 1410   TRIG 211 (H) 01/11/2021 1410   HDL 45 01/11/2021 1410   CHOLHDL 3.6 01/11/2021 1410   CHOLHDL 3.8 01/01/2014 1433   VLDL 38 01/01/2014 1433   LDLCALC 80 01/11/2021 1410    No BP recorded.  {Refresh Note OR Click here to enter BP  :1}***    ECG personally reviewed by me today-none today.  EKG 05/16/2022 normal sinus rhythm  low voltage QRS cannot rule out anterior infarct 68 bpm- No acute changes  Echocardiogram 11/09/2016  Study Conclusions   - Left ventricle: The cavity size was normal. Systolic function was    vigorous. The estimated ejection fraction was in the range of 65%    to 70%. Wall motion was normal; there were no regional wall    motion abnormalities. Doppler parameters are consistent with    abnormal left ventricular relaxation (grade 1 diastolic    dysfunction).  - Mitral valve: Calcified annulus.   -------------------------------------------------------------------  Study data:  Comparison was made to the study of 02/24/2006.  Study  status:  Routine.  Procedure:  Transthoracic echocardiography.  Image quality was adequate.  Study completion:  There were no  complications.          Transthoracic echocardiography.  M-mode,  complete 2D, spectral Doppler, and color Doppler.  Birthdate:  Patient birthdate: 08-16-1945.  Age:  Patient is 77 yr old.  Sex:  Gender: female.    BMI: 29.7 kg/m^2.  Blood pressure:     124/81  Patient status:  Outpatient.  Study date:  Study date: 11/09/2016.  Study time: 02:16 PM.  Location:  [facility]   -------------------------------------------------------------------   -------------------------------------------------------------------  Left ventricle:  The cavity size was normal. Systolic function was  vigorous. The estimated ejection fraction was in the range of 65%  to 70%. Wall motion was normal; there were no regional wall motion  abnormalities. Doppler parameters are consistent with abnormal left  ventricular relaxation (grade 1 diastolic dysfunction).   -------------------------------------------------------------------  Aortic valve:   Trileaflet; normal thickness leaflets. Mobility was  not restricted.  Doppler:  Transvalvular velocity was within the  normal range. There was no stenosis. There was no regurgitation.     -------------------------------------------------------------------  Aorta: Aortic root: The aortic root was normal in size.   -------------------------------------------------------------------  Mitral valve:   Calcified annulus. Mobility was not restricted.  Doppler:  Transvalvular velocity was within the normal range. There  was no evidence for stenosis. There was no regurgitation.    Peak  gradient (D): 3 mm Hg.   -------------------------------------------------------------------  Left atrium:  The atrium was normal in size.   -------------------------------------------------------------------  Right ventricle:  The cavity size was normal. Wall thickness was  normal. Systolic function was normal.   -------------------------------------------------------------------  Pulmonic valve:   Poorly visualized.  Structurally normal valve.  Cusp separation was normal.  Doppler:  Transvalvular velocity was  within the normal range. There was no evidence for stenosis. There  was no regurgitation.   -------------------------------------------------------------------  Tricuspid valve:   Structurally normal valve.    Doppler:  Transvalvular velocity was within the normal range. There was no  regurgitation.   -------------------------------------------------------------------  Pulmonary artery:   The main pulmonary artery was normal-sized.  Systolic pressure was within the normal range.   -------------------------------------------------------------------  Right atrium:  The atrium was normal in size.   -------------------------------------------------------------------  Pericardium: There was no pericardial effusion.   -------------------------------------------------------------------  Systemic veins:  Inferior vena cava: The vessel was normal in size.   Cardiac event monitor 06/15/2022  Patch Wear Time:  13 days and 6 hours (2023-12-07T19:11:05-499 to 2023-12-21T01:36:48-0500)    Patient had a min HR of 48 bpm, max HR of 169 bpm, and avg HR of 73 bpm. Predominant underlying rhythm was Sinus Rhythm. QRS morphology changes were present throughout recording. 73 Supraventricular Tachycardia runs occurred, the run with the fastest  interval lasting 5 beats with a max rate of 164 bpm, the longest lasting 15 beats with an avg rate of 94 bpm. Atrial Fibrillation occurred (6% burden), ranging from 74-169 bpm (avg of 115 bpm), the longest lasting 7 hours 51 mins with an avg rate of 115  bpm. 4 Pauses occurred, the longest lasting 5.3 secs (11 bpm). Idioventricular Rhythm was present. Atrial Fibrillation was detected within +/- 45 seconds of symptomatic patient event(s). Isolated SVEs were rare (<1.0%), SVE Couplets were rare (<1.0%),  and SVE Triplets were rare (<1.0%). Isolated VEs were rare (<1.0%, 470), VE Couplets were rare (<1.0%, 1), and VE Triplets were rare (<1.0%, 2).   The predominant rhythm was sinus rhythm at an average rate at 70 bpm.  Slowest sinus rhythm was 48 bpm which occurred at 11:48 PM on December 7 and the maximum sinus rhythm was 108 bpm which occurred at 8:12 PM on December 20.  73 SVT episodes heard with the fastest interval lasting 5 beats at a rate of 164 and the longest lasting 15 beats at an average rate at 94 bpm.  There was a 6% atrial fibrillation burden.  4 sinus pauses were were noted longest lasting 5.3 seconds which occurred on December 13  at 11:59 AM.  Several shorter duration pauses were noted during an episode of atrial fibrillation on December 13 at 11:51 AM.  Isolated PACs and PVCs were rare.  Assessment & Plan   1.  Palpitations-continues with  intermittent episodes of palpitations.  Cardiac event monitor showed 73 episodes of SVT.  She was also noted to have 6% atrial fibrillation burden, 4 sinus pauses with the longest lasting 5.3 seconds, and isolated PACs and PVCs.  Lab work was stable. This patients CHA2DS2-VASc Score and unadjusted Ischemic  Stroke Rate (% per year) is equal to 9.7 % stroke rate/year from a score of 6 [CHF, HTN, Vascular=MI/PAD/Aortic Plaque,  Female, 2,Age > 75 Continue apixaban Follow-up with the EP Avoid triggers caffeine, chocolate, EtOH, dehydration etc.  Essential hypertension-BP today 126/74***.  Well-controlled at home. Continue carvedilol, HCTZ, amlodipine Heart healthy low-sodium diet-salty 6 reviewed Increase physical activity as tolerated Order BMP, CBC  Hyperlipidemia-LDL 80 on 01/11/2021.   LDL goal less than 70. Continue atorvastatin Heart healthy low-sodium high-fiber diet.   Increase physical activity as tolerated  Coronary artery disease-denies recent chest discomfort or tightness.  Cardiac catheterization in 2012 which showed mild nonobstructive coronary disease.  Underwent subsequent stress testing 5/18 which showed normal perfusion. Continue atorvastatin and amlodipine, carvedilol Increase physical activity as tolerated  GERD- Asymptomatic. Continue omeprazole Follows with PCP   Disposition: Follow-up with Dr. Claiborne Billings or me in 4-6 months.   Jossie Ng. Everest Brod NP-C     06/27/2022, 7:10 AM [place] [address] Office [phone] Fax [phone]    I spent 14*** minutes examining this patient, reviewing medications, and using patient centered shared decision making involving her cardiac care.  Prior to her visit I spent greater than 20 minutes reviewing her past medical history,  medications, and prior cardiac tests.

## 2022-06-29 ENCOUNTER — Ambulatory Visit: Payer: Medicare Other | Attending: General Practice | Admitting: General Practice

## 2022-06-29 ENCOUNTER — Encounter: Payer: Self-pay | Admitting: General Practice

## 2022-06-29 VITALS — BP 124/86 | HR 84 | Ht 62.0 in | Wt 159.4 lb

## 2022-06-29 DIAGNOSIS — K219 Gastro-esophageal reflux disease without esophagitis: Secondary | ICD-10-CM | POA: Diagnosis not present

## 2022-06-29 DIAGNOSIS — I10 Essential (primary) hypertension: Secondary | ICD-10-CM

## 2022-06-29 DIAGNOSIS — R002 Palpitations: Secondary | ICD-10-CM

## 2022-06-29 DIAGNOSIS — E782 Mixed hyperlipidemia: Secondary | ICD-10-CM

## 2022-06-29 DIAGNOSIS — I2510 Atherosclerotic heart disease of native coronary artery without angina pectoris: Secondary | ICD-10-CM

## 2022-06-29 DIAGNOSIS — R0689 Other abnormalities of breathing: Secondary | ICD-10-CM

## 2022-06-29 NOTE — Patient Instructions (Signed)
Medication Instructions:  The current medical regimen is effective;  continue present plan and medications as directed. Please refer to the Current Medication list given to you today.   *If you need a refill on your cardiac medications before your next appointment, please call your pharmacy*  Lab Work: NONE If you have labs (blood work) drawn today and your tests are completely normal, you will receive your results only by: Peter (if you have MyChart) OR A paper copy in the mail If you have any lab test that is abnormal or we need to change your treatment, we will call you to review the results.  Testing/Procedures: NONE  Follow-Up: At [facility], you and your health needs are our priority.  As part of our continuing mission to provide you with exceptional heart care, we have created designated Provider Care Teams.  These Care Teams include your primary Cardiologist (physician) and Advanced Practice Providers (APPs -  Physician Assistants and Nurse Practitioners) who all work together to provide you with the care you need, when you need it.  Your next appointment:   6 month(s)  Provider:   Shelva Majestic, MD    KEEP SCHEDULED EP/[facility] APPOINTMENT  Other Instructions Please try to avoid these triggers: Do not use any products that have nicotine or tobacco in them. These include cigarettes, e-cigarettes, and chewing tobacco. If you need help quitting, ask your doctor. Eat heart-healthy foods. Talk with your doctor about the right eating plan for you. Exercise regularly as told by your doctor. Stay hydrated Do not drink alcohol, Caffeine or chocolate. Lose weight if you are overweight. Do not use drugs, including cannabis

## 2022-08-01 ENCOUNTER — Encounter: Payer: Self-pay | Admitting: Cardiology

## 2022-08-01 ENCOUNTER — Ambulatory Visit: Payer: 59 | Attending: Cardiology | Admitting: Cardiology

## 2022-08-01 VITALS — BP 120/66 | HR 62 | Ht 62.0 in | Wt 152.0 lb

## 2022-08-01 DIAGNOSIS — I480 Paroxysmal atrial fibrillation: Secondary | ICD-10-CM | POA: Diagnosis not present

## 2022-08-01 DIAGNOSIS — D6869 Other thrombophilia: Secondary | ICD-10-CM | POA: Diagnosis not present

## 2022-08-01 MED ORDER — AMIODARONE HCL 200 MG PO TABS: ORAL_TABLET | ORAL | 3 refills | Status: DC

## 2022-08-01 NOTE — Patient Instructions (Addendum)
Medication Instructions:  Your physician has recommended you make the following change in your medication:  START Amiodarone  - take 1 tablet TWICE daily for one month, then  - take 1 tablet ONCE daily   2. STOP Aspirin  *If you need a refill on your cardiac medications before your next appointment, please call your pharmacy*   Lab Work: Pre procedure labs -- see procedure instruction letter:  BMP & CBC  If you have labs (blood work) drawn today and your tests are completely normal, you will receive your results only by: [place] (if you have MyChart) OR A paper copy in the mail If you have any lab test that is abnormal or we need to change your treatment, we will call you to review the results.   Testing/Procedures: Your physician has requested that you have cardiac CT within 7 days PRIOR to your ablation. Cardiac computed tomography (CT) is a painless test that uses an x-ray machine to take clear, detailed pictures of your heart.   You will get a call from our office to schedule the date for this test.  Your physician has recommended that you have an ablation. Catheter ablation is a medical procedure used to treat some cardiac arrhythmias (irregular heartbeats). During catheter ablation, a long, thin, flexible tube is put into a blood vessel in your groin (upper thigh), or neck. This tube is called an ablation catheter. It is then guided to your heart through the blood vessel. Radio frequency waves destroy small areas of heart tissue where abnormal heartbeats may cause an arrhythmia to start.   Our scheduler will contact you to arrange this procedure.   Follow-Up: At [facility], you and your health needs are our priority.  As part of our continuing mission to provide you with exceptional heart care, we have created designated Provider Care Teams.  These Care Teams include your primary Cardiologist (physician) and Advanced Practice Providers (APPs -  Physician Assistants and  Nurse Practitioners) who all work together to provide you with the care you need, when you need it.   Your next appointment:   1 month(s) after your ablation  The format for your next appointment:   In Person  Provider:   AFib clinic   Thank you for choosing [facility]!!   Trinidad Curet, RN [phone]    Other Instructions   Cardiac Ablation Cardiac ablation is a procedure to destroy (ablate) some heart tissue that is sending bad signals. These bad signals cause problems in heart rhythm. The heart has many areas that make these signals. If there are problems in these areas, they can make the heart beat in a way that is not normal. Destroying some tissues can help make the heart rhythm normal. Tell your doctor about: Any allergies you have. All medicines you are taking. These include vitamins, herbs, eye drops, creams, and over-the-counter medicines. Any problems you or family members have had with medicines that make you fall asleep (anesthetics). Any blood disorders you have. Any surgeries you have had. Any medical conditions you have, such as kidney failure. Whether you are pregnant or may be pregnant. What are the risks? This is a safe procedure. But problems may occur, including: Infection. Bruising and bleeding. Bleeding into the chest. Stroke or blood clots. Damage to nearby areas of your body. Allergies to medicines or dyes. The need for a pacemaker if the normal system is damaged. Failure of the procedure to treat the problem. What happens before the procedure?  Medicines Ask your doctor about: Changing or stopping your normal medicines. This is important. Taking aspirin and ibuprofen. Do not take these medicines unless your doctor tells you to take them. Taking other medicines, vitamins, herbs, and supplements. General instructions Follow instructions from your doctor about what you cannot eat or drink. Plan to have someone take you home from the  hospital or clinic. If you will be going home right after the procedure, plan to have someone with you for 24 hours. Ask your doctor what steps will be taken to prevent infection. What happens during the procedure?  An IV tube will be put into one of your veins. You will be given a medicine to help you relax. The skin on your neck or groin will be numbed. A cut (incision) will be made in your neck or groin. A needle will be put through your cut and into a large vein. A tube (catheter) will be put into the needle. The tube will be moved to your heart. Dye may be put through the tube. This helps your doctor see your heart. Small devices (electrodes) on the tube will send out signals. A type of energy will be used to destroy some heart tissue. The tube will be taken out. Pressure will be held on your cut. This helps stop bleeding. A bandage will be put over your cut. The exact procedure may vary among doctors and hospitals. What happens after the procedure? You will be watched until you leave the hospital or clinic. This includes checking your heart rate, breathing rate, oxygen, and blood pressure. Your cut will be watched for bleeding. You will need to lie still for a few hours. Do not drive for 24 hours or as long as your doctor tells you. Summary Cardiac ablation is a procedure to destroy some heart tissue. This is done to treat heart rhythm problems. Tell your doctor about any medical conditions you may have. Tell him or her about all medicines you are taking to treat them. This is a safe procedure. But problems may occur. These include infection, bruising, bleeding, and damage to nearby areas of your body. Follow what your doctor tells you about food and drink. You may also be told to change or stop some of your medicines. After the procedure, do not drive for 24 hours or as long as your doctor tells you. This information is not intended to replace advice given to you by your health care  provider. Make sure you discuss any questions you have with your health care provider. Document Revised: 08/27/2021 Document Reviewed: 05/09/2019 Elsevier Patient Education  [place].

## 2022-08-01 NOTE — Progress Notes (Signed)
Electrophysiology Office Note   Date:  08/01/2022   ID:  AARTI Kline, DOB July 29, 1945, MRN [id number]  PCP:  Farrel Gordon, DO  Cardiologist:  Claiborne Billings Primary Electrophysiologist:  [name], MD    Chief Complaint: AF   History of Present Illness: Alexis Kline is a 77 y.o. female who is being seen today for the evaluation of AF at the request of Alexis Kline Alexis Ng, NP. Presenting today for electrophysiology evaluation.  She has a history stated for hypertension, hyperlipidemia, coronary artery disease, prior tobacco abuse, GERD.  She had been feeling palpitations.  She wore a cardiac monitor that showed a 6% atrial fibrillation burden.  She did have a 5-second posttermination pause.  She was potentially aware of her pauses.  She has had an episode of syncope.  She was walking to the bathroom in the middle of the night and had a fall.  She thinks that she may have passed out at that time.  She feels that she was on the ground for 2 hours.  She is only had the 1 episode.  She currently feels well and without major complaint.  She does have intermittent palpitations.  Today, she denies symptoms of palpitations, chest pain, shortness of breath, orthopnea, PND, lower extremity edema, claudication, dizziness, presyncope, syncope, bleeding, or neurologic sequela. The patient is tolerating medications without difficulties.    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 - based on [place] 09/2006   GERD (gastroesophageal reflux disease)    meds helps    Hemorrhoids    HLD (hyperlipidemia)    Hypertension     HYSTERECTOMY, HX OF 07/10/2006   Qualifier: Diagnosis of  By: Jiles Crocker DO, Elizabeth     Left flank pain 07/04/2019   Myocardial infarct, old    Pleuritic chest pain 01/01/2014   01/01/14: 4-5 month history of "clawing" sensation in her right lung that has worsened over the last month without an obvious trigger 01/02/14: CTA negative for PE   Stable angina    chronic    Stroke [facility])    many years ago ~ 30 yrs per pt    Past Surgical History:  Procedure Laterality Date   [place]   BREAST EXCISIONAL BIOPSY Right 1988   BREAST LUMPECTOMY Left    right breat   [place]   3 operations in right hand and 2 operations in left hand. Dr. Redmond Pulling in [place]   COLONOSCOPY  02/04/2015   [facility]   COLONOSCOPY  08/17/2020   [facility]   POLYPECTOMY     PTCA  2007   rectal stapling  2008   Done by Dr. Marlou Starks   RECTAL SURGERY  01/07/2007   Dr. Marlou Starks in [place].    SHOULDER SURGERY Bilateral 1991   Right shoulder surgery in 1991 and left shoulder surgery in 1992 by Dr. Redmond Pulling in [place].     Current Outpatient Medications  Medication Sig Dispense Refill  amiodarone (PACERONE) 200 MG tablet Take 1 tablet TWICE daily for one month, then take 1 tablet ONCE daily thereafter 60 tablet 3   amLODipine (NORVASC) 5 MG tablet Take 1 tablet (5 mg total) by mouth daily. SCHEDULE OFFICE VISIT FOR FUTURE REFILLS. 3RD ATTEMPT. 30 tablet 2   apixaban (ELIQUIS) 5 MG TABS tablet Take 1 tablet (5 mg total) by mouth 2 (two) times daily. 60 tablet 3   Aspirin-Caffeine (BAYER BACK & BODY) 500-32.5 MG TABS Take by mouth as needed.     atorvastatin (LIPITOR) 40 MG tablet TAKE ONE TABLET BY MOUTH DAILY 90 tablet 3   B Complex-Biotin-FA (SUPER B-COMPLEX PO) Take 1 tablet by mouth daily.     Calcium Carbonate-Vit D-Min (CALTRATE 600+D PLUS MINERALS) 600-800 MG-UNIT CHEW Chew 1 tablet by mouth in the morning and at bedtime.      carvedilol (COREG) 25 MG tablet Take 0.5 tablets (12.5 mg total) by mouth 2 (two) times daily with a meal. 180 tablet 2   omeprazole (PRILOSEC) 40 MG capsule Take 1 capsule (40 mg total) by mouth 2 (two) times daily. 180 capsule 0   senna-docusate (SENNALAX-S) 8.6-50 MG tablet Take 2 tablets by mouth 2 (two) times daily. 120 tablet 2   No current facility-administered medications for this visit.    Allergies:   [name] isothiocyanate], Iodine, Other, and Tape   Social History:  The patient  reports that she quit smoking about 9 years ago. Her smoking use included cigarettes. She has never used smokeless tobacco. She reports current alcohol use. She reports that she does not use drugs.   Family History:  The patient's family history includes Diabetes in her sister; Heart disease in her sister; Hyperlipidemia in her father and mother; Hypertension in her father and mother; Leukemia in her brother; Stomach cancer in her sister.    ROS:  Please see the history of present illness.   Otherwise, review of systems is positive for none.   All other systems are reviewed and negative.    PHYSICAL EXAM: VS:  BP 120/66   Pulse 62   Ht 5' 2"$  (1.575 m)   Wt 152 lb (68.9 kg)   LMP 06/20/1986   SpO2 98%   BMI 27.80 kg/m  , BMI Body mass index is 27.8 kg/m. GEN: Well nourished, well developed, in no acute distress  HEENT: normal  Neck: no JVD, carotid bruits, or masses Cardiac: RRR; no murmurs, rubs, or gallops,no edema  Respiratory:  clear to auscultation bilaterally, normal work of breathing GI: soft, nontender, nondistended, + BS MS: no deformity or atrophy  Skin: warm and dry Neuro:  Strength and sensation are intact Psych: euthymic mood, full affect  EKG:  EKG is ordered today. Personal review of the ekg ordered shows sinus rhythm, rate 62   Recent Labs: 08/10/2021: ALT 19 05/16/2022: BUN 12; Creatinine, Ser 0.73; Hemoglobin 12.4; Magnesium 1.7; Platelets 236; Potassium 4.4;  Sodium 145    Lipid Panel     Component Value Date/Time   CHOL 160 01/11/2021 1410   TRIG 211 (H) 01/11/2021 1410   HDL 45 01/11/2021 1410   CHOLHDL 3.6 01/11/2021 1410   CHOLHDL 3.8 01/01/2014 1433   VLDL 38 01/01/2014 1433   LDLCALC 80 01/11/2021 1410     Wt Readings from Last 3 Encounters:  08/01/22 152 lb (68.9 kg)  06/29/22 159 lb 6.4 oz (72.3 kg)  05/16/22 161 lb 6.4 oz (73.2 kg)      Other studies Reviewed:  Additional studies/ records that were reviewed today include: TTE 2018  Review of the above records today demonstrates:  Left ventricle: The cavity size was normal. Systolic function was    vigorous. The estimated ejection fraction was in the range of 65%    to 70%. Wall motion was normal; there were no regional wall    motion abnormalities. Doppler parameters are consistent with    abnormal left ventricular relaxation (grade 1 diastolic    dysfunction).  - Mitral valve: Calcified annulus.   Cardiac monitor 06/20/2022 personally reviewed The predominant rhythm was sinus rhythm at an average rate at 70 bpm. Slowest sinus rhythm was 48 bpm which occurred at 11:48 PM on December 7 and the maximum sinus rhythm was 108 bpm which occurred at 8:12 PM on December 20. 73 SVT episodes heard with the fastest interval lasting 5 beats at a rate of 164 and the longest lasting 15 beats at an average rate at 94 bpm. There was a 6% atrial fibrillation burden. 4 sinus pauses were were noted longest lasting 5.3 seconds which occurred on December 13 at 11:59 AM. Several shorter duration pauses were noted during an episode of atrial fibrillation on December 13 at 11:51 AM. Isolated PACs and PVCs were rare.   ASSESSMENT AND PLAN:  1.  Paroxysmal atrial fibrillation: 6% burden noted on cardiac monitor.  She feels quite poorly in atrial fibrillation with weakness, fatigue, shortness of breath.  Prefer a rhythm control strategy.  Additionally, she is having posttermination pauses.  Maintenance  of sinus rhythm may allow her to prevent pacemaker implant  She would like to avoid long-term medications.  Due to that, we Eris Breck plan for ablation.  Risk and benefits have been discussed.  She understands the risks and is agreed to the procedure.  Prior to ablation, Sonnet Rizor start her on amiodarone to keep in sinus rhythm.  200 mg twice daily for a month followed by 200 mg daily.  Risk, benefits, and alternatives to EP study and radiofrequency ablation for afib were also discussed in detail today. These risks include but are not limited to stroke, bleeding, vascular damage, tamponade, perforation, damage to the esophagus, lungs, and other structures, pulmonary vein stenosis, worsening renal function, and death. The patient understands these risk and wishes to proceed.  We Kabe Mckoy therefore proceed with catheter ablation at the next available time.  Carto, ICE, anesthesia are requested for the procedure.  Adama Ivins also obtain CT PV protocol prior to the procedure to exclude LAA thrombus and further evaluate atrial anatomy.   2.  Secondary to coagula state: Currently on Eliquis for atrial fibrillation  3.  Coronary artery disease: Mild and nonobstructive.  No current chest pain.  Plan per primary cardiology.  4.  Hypertension: Currently well-controlled    Current medicines are reviewed at length with the patient today.   The patient does not have concerns regarding her medicines.  The following changes were made today:  start amiodarone  Labs/ tests ordered today include:  Orders Placed This Encounter  Procedures   EKG 12-Lead     Disposition:   FU with [place] Nase 3 months  Signed, Nichola Cieslinski Meredith Leeds, MD  08/01/2022 1:28 PM     [place] [address] [place] [address] [phone] (office) [phone] (fax)

## 2022-08-03 ENCOUNTER — Encounter: Payer: Self-pay | Admitting: Cardiology

## 2022-08-03 ENCOUNTER — Other Ambulatory Visit: Payer: Self-pay

## 2022-08-03 ENCOUNTER — Telehealth: Payer: Self-pay

## 2022-08-03 DIAGNOSIS — I480 Paroxysmal atrial fibrillation: Secondary | ICD-10-CM

## 2022-08-03 NOTE — Telephone Encounter (Signed)
Called pt to schedule Afib Ablation. She will talk to her Yolanda Bonine and call me back with a date.

## 2022-08-03 NOTE — Telephone Encounter (Signed)
Error

## 2022-08-04 ENCOUNTER — Telehealth: Payer: Self-pay | Admitting: Cardiology

## 2022-08-04 NOTE — Telephone Encounter (Signed)
Unable to leave message

## 2022-08-04 NOTE — Telephone Encounter (Signed)
Pt reports hypotension, HA, nausea and unable to lay down/sleep. If she lays down she feels like she cannot breath and her heart is racing.  She has to sit in the recliner. Pt aware I will discuss this w/ MD and let her tomorrow/beginning of next week. Pt will hold off on anymore Amiodarone until hearing back from our office.  She is agreeable to plan.

## 2022-08-04 NOTE — Telephone Encounter (Signed)
Pt c/o medication issue:  1. Name of Medication:  Amiodarone HCl 200 MG   2. How are you currently taking this medication (dosage and times per day)? Yes   3. Are you having a reaction (difficulty breathing--STAT)? Yes  4. What is your medication issue? Patient states this medication is causing her bp to drop and HR to go up. Also causing her to headaches and nausea.   Today:  2:00 P.M. - 123/79 HR 59 5:00 A.M. - 89/58  HR 102

## 2022-08-05 NOTE — Telephone Encounter (Signed)
Left message to call back   (Decrease to once daily)

## 2022-08-08 NOTE — Telephone Encounter (Signed)
Pt returning call

## 2022-08-09 NOTE — Telephone Encounter (Signed)
Pt reports she is currently taking Amiodarone 1/2 tablet BID and doing ok on this regimen. Pt advised to start taking 1 whole tablet once daily going forward to see how she does. Pt agreeable to plan. She will call if SE begin again.

## 2022-08-17 ENCOUNTER — Other Ambulatory Visit: Payer: Self-pay | Admitting: Internal Medicine

## 2022-08-17 ENCOUNTER — Telehealth: Payer: Self-pay | Admitting: Cardiovascular Disease

## 2022-08-17 MED ORDER — AMLODIPINE BESYLATE 5 MG PO TABS: 5.0000 mg | ORAL_TABLET | Freq: Every day | ORAL | 1 refills | Status: DC

## 2022-08-17 NOTE — Telephone Encounter (Signed)
Pt c/o medication issue:  1. Name of Medication:   amLODipine (NORVASC) 5 MG tablet    2. How are you currently taking this medication (dosage and times per day)?   3. Are you having a reaction (difficulty breathing--STAT)?   4. What is your medication issue? [facility] calling to state they are only receiving 15 pills of this each month and are requesting more be sent in.

## 2022-08-17 NOTE — Telephone Encounter (Signed)
Sent in updated Rx for 90 day w/ refill.

## 2022-08-25 ENCOUNTER — Encounter: Payer: Self-pay | Admitting: Internal Medicine

## 2022-09-01 ENCOUNTER — Other Ambulatory Visit: Payer: Self-pay | Admitting: Internal Medicine

## 2022-09-01 DIAGNOSIS — I10 Essential (primary) hypertension: Secondary | ICD-10-CM

## 2022-09-29 ENCOUNTER — Ambulatory Visit
Admission: RE | Admit: 2022-09-29 | Discharge: 2022-09-29 | Disposition: A | Discharge status: Home / Self Care | Payer: 59 | Source: Ambulatory Visit | Attending: Internal Medicine | Admitting: Internal Medicine

## 2022-09-29 DIAGNOSIS — M810 Age-related osteoporosis without current pathological fracture: Secondary | ICD-10-CM | POA: Diagnosis not present

## 2022-09-29 DIAGNOSIS — Z0000 Encounter for general adult medical examination without abnormal findings: Secondary | ICD-10-CM

## 2022-09-29 DIAGNOSIS — Z780 Asymptomatic menopausal state: Secondary | ICD-10-CM | POA: Diagnosis not present

## 2022-11-04 ENCOUNTER — Encounter: Payer: Self-pay | Admitting: *Deleted

## 2022-11-15 ENCOUNTER — Other Ambulatory Visit: Payer: Self-pay | Admitting: Internal Medicine

## 2022-11-22 ENCOUNTER — Ambulatory Visit: Payer: 59 | Attending: Cardiology

## 2022-11-22 DIAGNOSIS — I480 Paroxysmal atrial fibrillation: Secondary | ICD-10-CM | POA: Diagnosis not present

## 2022-11-23 LAB — CBC
Hematocrit: 38.7 % (ref 34.0–46.6)
Hemoglobin: 11.9 g/dL (ref 11.1–15.9)
MCH: 24.7 pg — ABNORMAL LOW (ref 26.6–33.0)
MCHC: 30.7 g/dL — ABNORMAL LOW (ref 31.5–35.7)
MCV: 80 fL (ref 79–97)
Platelets: 277 10*3/uL (ref 150–450)
RBC: 4.82 x10E6/uL (ref 3.77–5.28)
RDW: 14.4 % (ref 11.7–15.4)
WBC: 6.2 10*3/uL (ref 3.4–10.8)

## 2022-11-23 LAB — BASIC METABOLIC PANEL
BUN/Creatinine Ratio: 13 (ref 12–28)
BUN: 10 mg/dL (ref 8–27)
CO2: 25 mmol/L (ref 20–29)
Calcium: 9.9 mg/dL (ref 8.7–10.3)
Chloride: 106 mmol/L (ref 96–106)
Creatinine, Ser: 0.78 mg/dL (ref 0.57–1.00)
Glucose: 93 mg/dL (ref 70–99)
Potassium: 4.1 mmol/L (ref 3.5–5.2)
Sodium: 143 mmol/L (ref 134–144)
eGFR: 79 mL/min/{1.73_m2} (ref >59)

## 2022-11-29 ENCOUNTER — Telehealth (HOSPITAL_COMMUNITY): Payer: Self-pay | Admitting: Emergency Medicine

## 2022-11-29 NOTE — Telephone Encounter (Signed)
Reaching out to patient to offer assistance regarding upcoming cardiac imaging study; pt verbalizes understanding of appt date/time, parking situation and where to check in, pre-test NPO status and medications ordered, and verified current allergies; name and call back number provided for further questions should they arise Fabion Gatson RN Navigator Cardiac Imaging [facility] [phone] office [phone] cell 

## 2022-11-30 ENCOUNTER — Ambulatory Visit (HOSPITAL_COMMUNITY)
Admission: RE | Admit: 2022-11-30 | Discharge: 2022-11-30 | Disposition: A | Discharge status: Home / Self Care | Payer: 59 | Source: Ambulatory Visit | Attending: Cardiology | Admitting: Cardiology

## 2022-11-30 DIAGNOSIS — I480 Paroxysmal atrial fibrillation: Secondary | ICD-10-CM | POA: Diagnosis not present

## 2022-11-30 MED ORDER — IOHEXOL 350 MG/ML SOLN
95.0000 mL | Freq: Once | INTRAVENOUS | Status: AC | PRN
  Administered 2022-11-30: 95 mL via INTRAVENOUS

## 2022-12-06 NOTE — Pre-Procedure Instructions (Signed)
Instructed patient on the following items: Arrival time 1030 Nothing to eat or drink after midnight No meds AM of procedure Responsible person to drive you home and stay with you for 24 hrs  Have you missed any doses of anti-coagulant Eliquis- takes twice a day, hasn't missed any doses.  Don't take dose this am.

## 2022-12-07 ENCOUNTER — Ambulatory Visit (HOSPITAL_BASED_OUTPATIENT_CLINIC_OR_DEPARTMENT_OTHER): Payer: 59 | Admitting: Anesthesiology

## 2022-12-07 ENCOUNTER — Ambulatory Visit (HOSPITAL_COMMUNITY): Payer: 59 | Admitting: Anesthesiology

## 2022-12-07 ENCOUNTER — Encounter (HOSPITAL_COMMUNITY)
Admission: RE | Disposition: A | Discharge status: Home / Self Care | Payer: Self-pay | Source: Home / Self Care | Attending: Cardiology

## 2022-12-07 ENCOUNTER — Ambulatory Visit (HOSPITAL_COMMUNITY)
Admission: RE | Admit: 2022-12-07 | Discharge: 2022-12-07 | Disposition: A | Discharge status: Home / Self Care | Payer: 59 | Attending: Cardiology | Admitting: Cardiology

## 2022-12-07 DIAGNOSIS — I10 Essential (primary) hypertension: Secondary | ICD-10-CM | POA: Diagnosis not present

## 2022-12-07 DIAGNOSIS — I4891 Unspecified atrial fibrillation: Secondary | ICD-10-CM

## 2022-12-07 DIAGNOSIS — I2510 Atherosclerotic heart disease of native coronary artery without angina pectoris: Secondary | ICD-10-CM | POA: Diagnosis not present

## 2022-12-07 DIAGNOSIS — E785 Hyperlipidemia, unspecified: Secondary | ICD-10-CM | POA: Insufficient documentation

## 2022-12-07 DIAGNOSIS — Z87891 Personal history of nicotine dependence: Secondary | ICD-10-CM

## 2022-12-07 DIAGNOSIS — I480 Paroxysmal atrial fibrillation: Secondary | ICD-10-CM | POA: Diagnosis not present

## 2022-12-07 DIAGNOSIS — K219 Gastro-esophageal reflux disease without esophagitis: Secondary | ICD-10-CM | POA: Diagnosis not present

## 2022-12-07 DIAGNOSIS — J449 Chronic obstructive pulmonary disease, unspecified: Secondary | ICD-10-CM

## 2022-12-07 HISTORY — PX: ATRIAL FIBRILLATION ABLATION: EP1191

## 2022-12-07 LAB — POCT ACTIVATED CLOTTING TIME: Activated Clotting Time: 299 seconds

## 2022-12-07 SURGERY — ATRIAL FIBRILLATION ABLATION
Anesthesia: General

## 2022-12-07 MED ORDER — ONDANSETRON HCL 4 MG/2ML IJ SOLN
INTRAMUSCULAR | Status: DC | PRN
  Administered 2022-12-07: 4 mg via INTRAVENOUS

## 2022-12-07 MED ORDER — PROPOFOL 10 MG/ML IV BOLUS
INTRAVENOUS | Status: DC | PRN
  Administered 2022-12-07: 100 mg via INTRAVENOUS

## 2022-12-07 MED ORDER — DOBUTAMINE INFUSION FOR EP/ECHO/NUC (1000 MCG/ML)
INTRAVENOUS | Status: DC | PRN
  Administered 2022-12-07: 20 ug/kg/min via INTRAVENOUS

## 2022-12-07 MED ORDER — SUGAMMADEX SODIUM 200 MG/2ML IV SOLN
INTRAVENOUS | Status: DC | PRN
  Administered 2022-12-07: 200 mg via INTRAVENOUS

## 2022-12-07 MED ORDER — HEPARIN (PORCINE) IN NACL 1000-0.9 UT/500ML-% IV SOLN
INTRAVENOUS | Status: DC | PRN
  Administered 2022-12-07 (×4): 500 mL

## 2022-12-07 MED ORDER — PROTAMINE SULFATE 10 MG/ML IV SOLN
INTRAVENOUS | Status: DC | PRN
  Administered 2022-12-07: 40 mg via INTRAVENOUS

## 2022-12-07 MED ORDER — PHENYLEPHRINE HCL-NACL 20-0.9 MG/250ML-% IV SOLN
INTRAVENOUS | Status: DC | PRN
  Administered 2022-12-07: 40 ug/min via INTRAVENOUS

## 2022-12-07 MED ORDER — ACETAMINOPHEN 325 MG PO TABS: 650.0000 mg | ORAL_TABLET | ORAL | Status: DC | PRN

## 2022-12-07 MED ORDER — ROCURONIUM BROMIDE 10 MG/ML (PF) SYRINGE
PREFILLED_SYRINGE | INTRAVENOUS | Status: DC | PRN
  Administered 2022-12-07: 50 mg via INTRAVENOUS

## 2022-12-07 MED ORDER — HEPARIN SODIUM (PORCINE) 1000 UNIT/ML IJ SOLN
INTRAMUSCULAR | Status: DC | PRN
  Administered 2022-12-07: 14000 [IU] via INTRAVENOUS
  Administered 2022-12-07: 3000 [IU] via INTRAVENOUS

## 2022-12-07 MED ORDER — SODIUM CHLORIDE 0.9% FLUSH: 3.0000 mL | INTRAVENOUS | Status: DC | PRN

## 2022-12-07 MED ORDER — ACETAMINOPHEN 500 MG PO TABS
1000.0000 mg | ORAL_TABLET | Freq: Once | ORAL | Status: AC
  Administered 2022-12-07: 1000 mg via ORAL
  Filled 2022-12-07: qty 2

## 2022-12-07 MED ORDER — FENTANYL CITRATE (PF) 250 MCG/5ML IJ SOLN
INTRAMUSCULAR | Status: DC | PRN
  Administered 2022-12-07: 50 ug via INTRAVENOUS
  Administered 2022-12-07: 25 ug via INTRAVENOUS

## 2022-12-07 MED ORDER — ONDANSETRON HCL 4 MG/2ML IJ SOLN: 4.0000 mg | Freq: Four times a day (QID) | INTRAMUSCULAR | Status: DC | PRN

## 2022-12-07 MED ORDER — SODIUM CHLORIDE 0.9 % IV SOLN: 250.0000 mL | INTRAVENOUS | Status: DC | PRN

## 2022-12-07 MED ORDER — DEXAMETHASONE SODIUM PHOSPHATE 10 MG/ML IJ SOLN
INTRAMUSCULAR | Status: DC | PRN
  Administered 2022-12-07: 5 mg via INTRAVENOUS

## 2022-12-07 MED ORDER — HEPARIN SODIUM (PORCINE) 1000 UNIT/ML IJ SOLN
INTRAMUSCULAR | Status: DC | PRN
  Administered 2022-12-07: 1000 [IU] via INTRAVENOUS

## 2022-12-07 MED ORDER — LIDOCAINE 2% (20 MG/ML) 5 ML SYRINGE
INTRAMUSCULAR | Status: DC | PRN
  Administered 2022-12-07: 80 mg via INTRAVENOUS

## 2022-12-07 MED ORDER — SODIUM CHLORIDE 0.9 % IV SOLN: INTRAVENOUS | Status: DC

## 2022-12-07 SURGICAL SUPPLY — 23 items
BAG SNAP BAND KOVER 36X36 (MISCELLANEOUS) IMPLANT
BLANKET WARM UNDERBOD FULL ACC (MISCELLANEOUS) ×1 IMPLANT
CATH ABLAT QDOT MICRO BI TC DF (CATHETERS) IMPLANT
CATH OCTARAY 2.0 F 3-3-3-3-3 (CATHETERS) IMPLANT
CATH S-M CIRCA TEMP PROBE (CATHETERS) IMPLANT
CATH SOUNDSTAR ECO 8FR (CATHETERS) IMPLANT
CATH WEB BI DIR CSDF CRV REPRO (CATHETERS) IMPLANT
CLOSURE MYNX CONTROL 6F/7F (Vascular Products) IMPLANT
CLOSURE PERCLOSE PROSTYLE (VASCULAR PRODUCTS) IMPLANT
COVER SWIFTLINK CONNECTOR (BAG) ×1 IMPLANT
NDL BAYLIS TRANSSEPTAL 71CM (NEEDLE) IMPLANT
NEEDLE BAYLIS TRANSSEPTAL 71CM (NEEDLE) ×1 IMPLANT
PACK EP LATEX FREE (CUSTOM PROCEDURE TRAY) ×1
PACK EP LF (CUSTOM PROCEDURE TRAY) ×1 IMPLANT
PAD DEFIB RADIO PHYSIO CONN (PAD) ×1 IMPLANT
PATCH CARTO3 (PAD) IMPLANT
SHEATH CARTO VIZIGO SM CVD (SHEATH) IMPLANT
SHEATH PINNACLE 7F 10CM (SHEATH) IMPLANT
SHEATH PINNACLE 8F 10CM (SHEATH) IMPLANT
SHEATH PINNACLE 9F 10CM (SHEATH) IMPLANT
SHEATH PROBE COVER 6X72 (BAG) IMPLANT
SHEATH SWARTZ TS SL2 63CM 8.5F (SHEATH) IMPLANT
TUBING SMART ABLATE COOLFLOW (TUBING) IMPLANT

## 2022-12-07 NOTE — Transfer of Care (Signed)
Immediate Anesthesia Transfer of Care Note  Patient: Alexis Kline  Procedure(s) Performed: ATRIAL FIBRILLATION ABLATION  Patient Location: PACU and Cath Lab  Anesthesia Type:General  Level of Consciousness: awake, alert , and oriented  Airway & Oxygen Therapy: Patient connected to nasal cannula oxygen  Post-op Assessment: Post -op Vital signs reviewed and stable  Post vital signs: stable  Last Vitals:  Vitals Value Taken Time  BP    Temp    Pulse 68 12/07/22 1549  Resp    SpO2 89 % 12/07/22 1549  Vitals shown include unvalidated device data.  Last Pain:  Vitals:   12/07/22 1037  TempSrc: Temporal  PainSc:       Patients Stated Pain Goal: 4 (12/07/22 1033)  Complications: There were no known notable events for this encounter.

## 2022-12-07 NOTE — Discharge Instructions (Signed)

## 2022-12-07 NOTE — Anesthesia Procedure Notes (Signed)
Procedure Name: Intubation Date/Time: 12/07/2022 2:22 PM  Performed by: Collene Schlichter, MDPre-anesthesia Checklist: Patient identified, Emergency Drugs available, Suction available and Patient being monitored Patient Re-evaluated:Patient Re-evaluated prior to induction Oxygen Delivery Method: Circle system utilized Preoxygenation: Pre-oxygenation with 100% oxygen Induction Type: IV induction Ventilation: Mask ventilation without difficulty Laryngoscope Size: Mac and 3 Grade View: Grade I Tube type: Oral Tube size: 7.0 mm Number of attempts: 1 Airway Equipment and Method: Stylet and Oral airway Placement Confirmation: ETT inserted through vocal cords under direct vision, positive ETCO2 and breath sounds checked- equal and bilateral Tube secured with: Tape Dental Injury: Teeth and Oropharynx as per pre-operative assessment

## 2022-12-07 NOTE — Anesthesia Preprocedure Evaluation (Addendum)
Anesthesia Evaluation  Patient identified by MRN, date of birth, ID band Patient awake    Reviewed: Allergy & Precautions, NPO status , Patient's Chart, lab work & pertinent test results, reviewed documented beta blocker date and time   Airway Mallampati: II  TM Distance: >3 FB Neck ROM: Full    Dental  (+) Edentulous Upper, Edentulous Lower   Pulmonary COPD, former smoker   Pulmonary exam normal breath sounds clear to auscultation       Cardiovascular hypertension, Pt. on medications and Pt. on home beta blockers + CAD and + Past MI  Normal cardiovascular exam+ dysrhythmias Atrial Fibrillation  Rhythm:Regular Rate:Normal     Neuro/Psych CVA  negative psych ROS   GI/Hepatic Neg liver ROS,GERD  Medicated,,  Endo/Other  negative endocrine ROS    Renal/GU negative Renal ROS     Musculoskeletal  (+) Arthritis ,    Abdominal   Peds  Hematology negative hematology ROS (+)   Anesthesia Other Findings Day of surgery medications reviewed with the patient.  Reproductive/Obstetrics                             Anesthesia Physical Anesthesia Plan  ASA: 3  Anesthesia Plan: General   Post-op Pain Management: Tylenol PO (pre-op)*   Induction: Intravenous  PONV Risk Score and Plan: 3 and Dexamethasone and Ondansetron  Airway Management Planned: Oral ETT  Additional Equipment:   Intra-op Plan:   Post-operative Plan:   Informed Consent: I have reviewed the patients History and Physical, chart, labs and discussed the procedure including the risks, benefits and alternatives for the proposed anesthesia with the patient or authorized representative who has indicated his/her understanding and acceptance.     Dental advisory given  Plan Discussed with: CRNA  Anesthesia Plan Comments:        Anesthesia Quick Evaluation

## 2022-12-07 NOTE — H&P (Signed)
Electrophysiology Office Note   Date:  12/07/2022   ID:  Alexis Kline, DOB Oct 21, 1945, MRN [id number]  PCP:  Champ Mungo, DO  Cardiologist:  Tresa Endo Primary Electrophysiologist:  Juleah Paradise Jorja Loa, MD    Chief Complaint: AF   History of Present Illness: Alexis Kline is a 77 y.o. female who is being seen today for the evaluation of AF at the request of Regan Lemming, MD. Presenting today for electrophysiology evaluation.  She has a history stated for hypertension, hyperlipidemia, coronary artery disease, prior tobacco abuse, GERD.  She had been feeling palpitations.  She wore a cardiac monitor that showed a 6% atrial fibrillation burden.  She did have a 5-second posttermination pause.  She was potentially aware of her pauses.  She has had an episode of syncope.  She was walking to the bathroom in the middle of the night and had a fall.  She thinks that she may have passed out at that time.  She feels that she was on the ground for 2 hours.  She is only had the 1 episode.  She currently feels well and without major complaint.  She does have intermittent palpitations.  Today, denies symptoms of palpitations, chest pain, shortness of breath, orthopnea, PND, lower extremity edema, claudication, dizziness, presyncope, syncope, bleeding, or neurologic sequela. The patient is tolerating medications without difficulties. Plan ablation today.    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 - based on [place] 09/2006   GERD (gastroesophageal reflux disease)    meds helps    Hemorrhoids    HLD (hyperlipidemia)     Hypertension    HYSTERECTOMY, HX OF 07/10/2006   Qualifier: Diagnosis of  By: Landis Martins DO, Elizabeth     Left flank pain 07/04/2019   Myocardial infarct, old    Pleuritic chest pain 01/01/2014   01/01/14: 4-5 month history of "clawing" sensation in her right lung that has worsened over the last month without an obvious trigger 01/02/14: CTA negative for PE   Stable angina    chronic    Stroke [facility])    many years ago ~ 30 yrs per pt    Past Surgical History:  Procedure Laterality Date   ABDOMINAL HYSTERECTOMY  1988   APPENDECTOMY  1966   BLADDER SUSPENSION  1997   BREAST EXCISIONAL BIOPSY Right 1988   BREAST LUMPECTOMY Left    right breat   CARPAL TUNNEL RELEASE  1991, 1993   3 operations in right hand and 2 operations in left hand. Dr. Andrey Campanile in [place]   COLONOSCOPY  02/04/2015   Rob Bunting   COLONOSCOPY  08/17/2020   DJ   POLYPECTOMY     PTCA  2007   rectal stapling  2008   Done by Dr. Carolynne Edouard   RECTAL SURGERY  01/07/2007   Dr. Carolynne Edouard in [place].    SHOULDER SURGERY Bilateral 1991   Right shoulder surgery in 1991 and left shoulder surgery in 1992 by Dr. Andrey Campanile in [place].     Current Facility-Administered Medications  Medication Dose Route Frequency  Provider Last Rate Last Admin   0.9 %  sodium chloride infusion   Intravenous Continuous Regan Lemming, MD 50 mL/hr at 12/07/22 1037 New Bag at 12/07/22 1037    Allergies:   Arlice Colt isothiocyanate], Iodine, Other, and Tape   Social History:  The patient  reports that she quit smoking about 9 years ago. Her smoking use included cigarettes. She has never used smokeless tobacco. She reports current alcohol use. She reports that she does not use drugs.   Family History:  The patient's family history includes Diabetes in her sister; Heart disease in her sister; Hyperlipidemia in her father and mother; Hypertension in her father and mother; Leukemia in her brother; Stomach cancer in her sister.    ROS:  Please see the  history of present illness.   Otherwise, review of systems is positive for none.   All other systems are reviewed and negative.   PHYSICAL EXAM: VS:  BP (!) 162/84   Pulse 66   Temp 98.5 F (36.9 C) (Temporal)   Resp 18   Ht 5\' 2"  (1.575 m)   Wt 72.6 kg   LMP 06/20/1986   SpO2 97%   BMI 29.26 kg/m  , BMI Body mass index is 29.26 kg/m. GEN: Well nourished, well developed, in no acute distress  HEENT: normal  Neck: no JVD, carotid bruits, or masses Cardiac: RRR; no murmurs, rubs, or gallops,no edema  Respiratory:  clear to auscultation bilaterally, normal work of breathing GI: soft, nontender, nondistended, + BS MS: no deformity or atrophy  Skin: warm and dry Neuro:  Strength and sensation are intact Psych: euthymic mood, full affect   Recent Labs: 05/16/2022: Magnesium 1.7 11/22/2022: BUN 10; Creatinine, Ser 0.78; Hemoglobin 11.9; Platelets 277; Potassium 4.1; Sodium 143    Lipid Panel     Component Value Date/Time   CHOL 160 01/11/2021 1410   TRIG 211 (H) 01/11/2021 1410   HDL 45 01/11/2021 1410   CHOLHDL 3.6 01/11/2021 1410   CHOLHDL 3.8 01/01/2014 1433   VLDL 38 01/01/2014 1433   LDLCALC 80 01/11/2021 1410     Wt Readings from Last 3 Encounters:  12/07/22 72.6 kg  08/01/22 68.9 kg  06/29/22 72.3 kg      Other studies Reviewed: Additional studies/ records that were reviewed today include: TTE 2018  Review of the above records today demonstrates:  Left ventricle: The cavity size was normal. Systolic function was    vigorous. The estimated ejection fraction was in the range of 65%    to 70%. Wall motion was normal; there were no regional wall    motion abnormalities. Doppler parameters are consistent with    abnormal left ventricular relaxation (grade 1 diastolic    dysfunction).  - Mitral valve: Calcified annulus.   Cardiac monitor 06/20/2022 personally reviewed The predominant rhythm was sinus rhythm at an average rate at 70 bpm. Slowest sinus rhythm was  48 bpm which occurred at 11:48 PM on December 7 and the maximum sinus rhythm was 108 bpm which occurred at 8:12 PM on December 20. 73 SVT episodes heard with the fastest interval lasting 5 beats at a rate of 164 and the longest lasting 15 beats at an average rate at 94 bpm. There was a 6% atrial fibrillation burden. 4 sinus pauses were were noted longest lasting 5.3 seconds which occurred on December 13 at 11:59 AM. Several shorter duration pauses were noted during an episode of atrial fibrillation on December 13 at 11:51 AM. Isolated  PACs and PVCs were rare.   ASSESSMENT AND PLAN:  1.  Paroxysmal atrial fibrillation: Alexis Kline has presented today for surgery, with the diagnosis of AF.  The various methods of treatment have been discussed with the patient and family. After consideration of risks, benefits and other options for treatment, the patient has consented to  Procedure(s): Catheter ablation as a surgical intervention .  Risks include but not limited to complete heart block, stroke, esophageal damage, nerve damage, bleeding, vascular damage, tamponade, perforation, MI, and death. The patient's history has been reviewed, patient examined, no change in status, stable for surgery.  I have reviewed the patient's chart and labs.  Questions were answered to the patient's satisfaction.    Yasheka Fossett Elberta Fortis, MD 12/07/2022 10:54 AM

## 2022-12-08 ENCOUNTER — Encounter (HOSPITAL_COMMUNITY): Payer: Self-pay | Admitting: Cardiology

## 2022-12-08 NOTE — Progress Notes (Signed)
Client states tongue bruised and called Heather, CRNA and per [facility] client advised that that is something that can happen and to eat ice chips or something cold and client notified and voiced understanding

## 2022-12-08 NOTE — Anesthesia Postprocedure Evaluation (Signed)
Anesthesia Post Note  Patient: Alexis Kline  Procedure(s) Performed: ATRIAL FIBRILLATION ABLATION     Patient location during evaluation: Cath Lab Anesthesia Type: General Level of consciousness: awake and alert Pain management: pain level controlled Vital Signs Assessment: post-procedure vital signs reviewed and stable Respiratory status: spontaneous breathing, nonlabored ventilation, respiratory function stable and patient connected to nasal cannula oxygen Cardiovascular status: blood pressure returned to baseline and stable Postop Assessment: no apparent nausea or vomiting Anesthetic complications: no   There were no known notable events for this encounter.  Last Vitals:  Vitals:   12/07/22 1818 12/07/22 1903  BP: (!) 150/80 (!) 167/72  Pulse: 70 70  Resp: 19 17  Temp:    SpO2: 94% 92%    Last Pain:  Vitals:   12/07/22 1903  TempSrc:   PainSc: 0-No pain                 Collene Schlichter

## 2022-12-27 ENCOUNTER — Ambulatory Visit (HOSPITAL_COMMUNITY): Payer: 59

## 2022-12-27 ENCOUNTER — Ambulatory Visit: Payer: 59 | Admitting: Student

## 2022-12-27 ENCOUNTER — Other Ambulatory Visit: Payer: Self-pay

## 2022-12-27 ENCOUNTER — Ambulatory Visit (HOSPITAL_COMMUNITY)
Admission: RE | Admit: 2022-12-27 | Discharge: 2022-12-27 | Disposition: A | Discharge status: Home / Self Care | Payer: 59 | Source: Ambulatory Visit | Attending: Internal Medicine | Admitting: Internal Medicine

## 2022-12-27 ENCOUNTER — Encounter: Payer: Self-pay | Admitting: Student

## 2022-12-27 VITALS — BP 148/59 | HR 59 | Temp 97.6°F | Ht 62.0 in | Wt 167.9 lb

## 2022-12-27 DIAGNOSIS — Z7901 Long term (current) use of anticoagulants: Secondary | ICD-10-CM | POA: Diagnosis not present

## 2022-12-27 DIAGNOSIS — I25811 Atherosclerosis of native coronary artery of transplanted heart without angina pectoris: Secondary | ICD-10-CM

## 2022-12-27 DIAGNOSIS — I495 Sick sinus syndrome: Secondary | ICD-10-CM

## 2022-12-27 DIAGNOSIS — R519 Headache, unspecified: Secondary | ICD-10-CM | POA: Diagnosis not present

## 2022-12-27 DIAGNOSIS — R29818 Other symptoms and signs involving the nervous system: Secondary | ICD-10-CM | POA: Diagnosis not present

## 2022-12-27 DIAGNOSIS — I6782 Cerebral ischemia: Secondary | ICD-10-CM | POA: Insufficient documentation

## 2022-12-27 DIAGNOSIS — I4891 Unspecified atrial fibrillation: Secondary | ICD-10-CM | POA: Diagnosis not present

## 2022-12-27 DIAGNOSIS — I480 Paroxysmal atrial fibrillation: Secondary | ICD-10-CM | POA: Diagnosis not present

## 2022-12-27 MED ORDER — GADOBUTROL 1 MMOL/ML IV SOLN
7.5000 mL | Freq: Once | INTRAVENOUS | Status: AC | PRN
  Administered 2022-12-27: 7.5 mL via INTRAVENOUS

## 2022-12-27 NOTE — Assessment & Plan Note (Deleted)
Going "downhill" every since ablation. Having trouble walking, staggering around like a "drunk." Has to steady herself against a wall when walking. Headaches, feels like someone pushing on her head from the inside. Come and go. Start suddenly. 2-3 headache days per week. Last an hour. Typically in the afternoon and night before bed. Trying not to take painkillers for this. Can't identify any triggers for headache. Like being in a "tunnel." Shortness of breath with short walks, stairs are out of the question. Gasps for breath when getting the mail.

## 2022-12-27 NOTE — Assessment & Plan Note (Signed)
No atrial fibrillation today.  Do not think her symptoms are due to arrhythmia.  She continues to take Eliquis and amiodarone.

## 2022-12-27 NOTE — Addendum Note (Signed)
Addended by: Marrianne Mood on: 12/27/2022 05:09 PM   Modules accepted: Orders

## 2022-12-27 NOTE — Assessment & Plan Note (Signed)
Dyspnea on exertion sounds fairly typical for angina.  Not associated with chest pain.  I note that her last workup for angina showed abnormal nuclear stress test but nonobstructive coronary artery disease.  Recent coronary artery calcium score is in the 76 percentile for her age.  EKG today notable for Q waves in the precordial leads but no concerning ST changes.  She has an appointment with cardiology upcoming, will message the provider to see if she can be moved up.  Also wonder about an echocardiographic evaluation of this person.  I think her symptoms could be explained by heart failure but she does not look volume overloaded on exam.  She does have a soft systolic murmur which I doubt represents any significant valvulopathy, her last echo was in 2018 and showed a normal ejection fraction without any hemodynamically significant valvular abnormalities.

## 2022-12-27 NOTE — Patient Instructions (Signed)
This after visit summary is an important review of tests, referrals, and medication changes that were discussed during your visit. If you have questions or concerns, call [phone]. Outside of clinic business hours, call the main hospital at [phone] and ask the operator for the on-call internal medicine resident.   Ernesta Amble MD 12/27/2022, 11:48 AM

## 2022-12-27 NOTE — Progress Notes (Addendum)
Subjective:  Alexis Kline is a 77 y.o. who presents to clinic for the following:  Going "downhill" for last few weeks since ablation. Having trouble walking, staggering around like a "drunk." Has to steady herself against a wall when walking. Left hand feels differently than right, lacking regular sensation.  Headaches, feels like someone pushing on her head from the inside. Come and go. Start suddenly. 2-3 headache days per week. Last an hour. Typically in the afternoon and night before bed. Trying not to take painkillers for this. Can't identify any triggers for headache. Like being in a "tunnel."   Shortness of breath with short walks, stairs are out of the question. Gasps for breath when getting the mail.   Review of Systems  HENT:  Negative for hearing loss.   Eyes:  Negative for blurred vision.  Respiratory:  Negative for cough and wheezing.   Cardiovascular:  Positive for leg swelling (Chronic and stable). Negative for chest pain and orthopnea.  Musculoskeletal:  Negative for falls.  Neurological:  Positive for sensory change (Abnormal sensation left hand) and weakness (Generalized weakness). Negative for dizziness, tingling, focal weakness and loss of consciousness.   Objective:   Vitals:   12/27/22 0915 12/27/22 0927  BP: (!) 142/68 (!) 148/59  Pulse: 64 (!) 59  Temp: 97.6 F (36.4 C)   TempSrc: Oral   SpO2: 99%   Weight: 167 lb 14.4 oz (76.2 kg)   Height: 5\' 2"  (1.575 m)     Physical Exam Tired appearing female sitting in chair in exam room with cane by her side Neck is grossly normal appearing, oral mucous membranes are moist and pink Heart rate is normal, rhythm is regular, radial pulses are strong, soft systolic murmur, trace lower extremity pitting edema, no appreciable JVD Breathing is regular and unlabored, no wheezing or crackles Abdomen is nontender Skin is warm and dry Alert and oriented, speech is normal sounding, tongue is midline, palatal  elevation symmetric, no facial asymmetry, shoulder shrug equal bilaterally, extraocular movements intact, visual field testing was normal by confrontation, no nystagmus, grip strength equal bilaterally, no pronator drift, heel-to-shin and sequential finger tapping both abnormal on the left side, Romberg positive, gait is slow with cane  Assessment & Plan:   Problem List Items Addressed This Visit     Coronary artery disease involving native artery of transplanted heart without angina pectoris - Primary (Chronic)    Dyspnea on exertion sounds fairly typical for angina.  Not associated with chest pain.  I note that her last workup for angina showed abnormal nuclear stress test but nonobstructive coronary artery disease.  Recent coronary artery calcium score is in the 76 percentile for her age.  EKG today notable for Q waves in the precordial leads but no concerning ST changes.  She has an appointment with cardiology upcoming, will message the provider to see if she can be moved up.  Also wonder about an echocardiographic evaluation of this person.  I think her symptoms could be explained by heart failure but she does not look volume overloaded on exam.  She does have a soft systolic murmur which I doubt represents any significant valvulopathy, her last echo was in 2018 and showed a normal ejection fraction without any hemodynamically significant valvular abnormalities.      Relevant Orders   EKG 12-Lead (Completed)   Focal neurological deficit, onset greater than 24 hours    Her symptoms, including new headache, and exam are suggestive of cerebellar lesion.  She has risk factors including prior stroke (decades ago, details are unclear), A-fib status post recent ablation.  Symptoms are not acute.  Currently on daily aspirin and Eliquis.  EKG does not show A-fib today.  Stat MRI ordered.  Update 12/27/22 5:03 PM MRI results notable for cortical enhancement over right parietal lobe. I wonder if this  correlates with her deficits. Laterality is consistent but her exam is more suggestive a cerebellar etiology. Will refer to neurology urgently for recommendations in light of suspected cerebral infarct.      Relevant Orders   MR Brain W Wo Contrast (Completed)   Paroxysmal atrial fibrillation with conversion pauses (HCC) (Chronic)    No atrial fibrillation today.  Do not think her symptoms are due to arrhythmia.  She continues to take Eliquis and amiodarone.       Return if symptoms worsen or fail to improve.  Patient discussed with Dr. Durene Romans MD 12/27/2022, 5:06 PM  [phone]

## 2022-12-27 NOTE — Assessment & Plan Note (Addendum)
Her symptoms, including new headache, and exam are suggestive of cerebellar lesion.  She has risk factors including prior stroke (decades ago, details are unclear), A-fib status post recent ablation.  Symptoms are not acute.  Currently on daily aspirin and Eliquis.  EKG does not show A-fib today.  Stat MRI ordered.  Update 12/27/22 5:03 PM MRI results notable for cortical enhancement over right parietal lobe. I wonder if this correlates with her deficits. Laterality is consistent but her exam is more suggestive a cerebellar etiology. Will refer to neurology urgently for recommendations in light of suspected cerebral infarct.

## 2022-12-28 ENCOUNTER — Telehealth: Payer: Self-pay

## 2022-12-28 NOTE — Telephone Encounter (Signed)
RTC to [facility] after speaking with Dr. Benito Mccreedy. Dr. Cyndie Chime aware of the results and will be contacting patient about follow up.

## 2022-12-28 NOTE — Telephone Encounter (Signed)
Dianne from [facility] called regarding the results from patients MRI of her brain, Dianne specifically wanted the provider to review impressions #2.  " Apparent 2 mm focus of cortical enhancement within the right parietal lobe (appreciated on post-contrast series 12 only). This may reflect vascular enhancement, a late subacute infarct or artifact. However, alternative etiologies (such as a small cortical metastasis) cannot be excluded. A follow-up brain MRI (with and"

## 2022-12-30 NOTE — Progress Notes (Signed)
Internal Medicine Clinic Attending  Case discussed with the resident at the time of the visit.  We reviewed the resident's history and exam and pertinent patient test results.  I agree with the assessment, diagnosis, and plan of care documented in the resident's note.  

## 2022-12-31 ENCOUNTER — Other Ambulatory Visit: Payer: Self-pay | Admitting: Internal Medicine

## 2023-01-04 ENCOUNTER — Ambulatory Visit (HOSPITAL_COMMUNITY)
Admission: RE | Admit: 2023-01-04 | Discharge: 2023-01-04 | Disposition: A | Discharge status: Home / Self Care | Payer: 59 | Source: Ambulatory Visit | Attending: Internal Medicine | Admitting: Internal Medicine

## 2023-01-04 ENCOUNTER — Encounter (HOSPITAL_COMMUNITY): Payer: Self-pay | Admitting: Internal Medicine

## 2023-01-04 ENCOUNTER — Other Ambulatory Visit: Payer: Self-pay

## 2023-01-04 VITALS — BP 144/76 | HR 60 | Ht 62.0 in | Wt 164.0 lb

## 2023-01-04 DIAGNOSIS — Z79899 Other long term (current) drug therapy: Secondary | ICD-10-CM | POA: Insufficient documentation

## 2023-01-04 DIAGNOSIS — I480 Paroxysmal atrial fibrillation: Secondary | ICD-10-CM | POA: Insufficient documentation

## 2023-01-04 DIAGNOSIS — Z7901 Long term (current) use of anticoagulants: Secondary | ICD-10-CM | POA: Insufficient documentation

## 2023-01-04 DIAGNOSIS — I495 Sick sinus syndrome: Secondary | ICD-10-CM | POA: Diagnosis not present

## 2023-01-04 DIAGNOSIS — I2510 Atherosclerotic heart disease of native coronary artery without angina pectoris: Secondary | ICD-10-CM | POA: Insufficient documentation

## 2023-01-04 DIAGNOSIS — R1310 Dysphagia, unspecified: Secondary | ICD-10-CM | POA: Insufficient documentation

## 2023-01-04 DIAGNOSIS — I110 Hypertensive heart disease with heart failure: Secondary | ICD-10-CM | POA: Insufficient documentation

## 2023-01-04 DIAGNOSIS — K219 Gastro-esophageal reflux disease without esophagitis: Secondary | ICD-10-CM | POA: Diagnosis not present

## 2023-01-04 DIAGNOSIS — D6869 Other thrombophilia: Secondary | ICD-10-CM | POA: Insufficient documentation

## 2023-01-04 DIAGNOSIS — I509 Heart failure, unspecified: Secondary | ICD-10-CM | POA: Diagnosis not present

## 2023-01-04 DIAGNOSIS — E785 Hyperlipidemia, unspecified: Secondary | ICD-10-CM | POA: Diagnosis not present

## 2023-01-04 NOTE — Progress Notes (Signed)
Primary Care Physician: Champ Mungo, DO Primary Cardiologist: Nicki Guadalajara, MD Electrophysiologist: Will Jorja Loa, MD     Referring Physician: Dr. Kaleen Mask is a 77 y.o. female with a history of HTN, HLD, CAD, prior tobacco use, GERD, syncope, and paroxysmal atrial fibrillation who presents for consultation in the [facility]. Patient is s/p Afib ablation on 12/07/22 by Dr. Elberta Fortis. Patient is on Eliquis for a CHADS2VASC score of 5.  On evaluation today, she is currently in NSR. No episodes of Afib since ablation. No chest pain. She has had intermittent SOB but notes this is getting somewhat better. She has noted over the past month difficulty swallowing foods. She specifically states she will have to cut up solid foods into tiny foods  Leg sites healed without issue. She is currently on amiodarone 200 mg daily. No missed doses of anticoagulant.  Today, she denies symptoms of orthopnea, PND, lower extremity edema, dizziness, presyncope, syncope, snoring, daytime somnolence, bleeding, or neurologic sequela. The patient is tolerating medications without difficulties and is otherwise without complaint today.    she has a BMI of Body mass index is 30 kg/m.Marland Kitchen Filed Weights   01/04/23 0952  Weight: 74.4 kg    Current Outpatient Medications  Medication Sig Dispense Refill   amiodarone (PACERONE) 200 MG tablet Take 1 tablet TWICE daily for one month, then take 1 tablet ONCE daily thereafter (Patient taking differently: Take 200 mg by mouth in the morning.) 60 tablet 3   amLODipine (NORVASC) 5 MG tablet Take 1 tablet (5 mg total) by mouth daily. (Patient taking differently: Take 5 mg by mouth every evening.) 90 tablet 1   apixaban (ELIQUIS) 5 MG TABS tablet Take 1 tablet (5 mg total) by mouth 2 (two) times daily. 60 tablet 3   aspirin EC 81 MG tablet Take 81 mg by mouth every evening. Swallow whole.     Aspirin-Caffeine (BAYER BACK & BODY  PAIN EX ST) 500-32.5 MG TABS Take by mouth. Taking 1-2 tablets by mouth as needed     atorvastatin (LIPITOR) 40 MG tablet TAKE ONE TABLET BY MOUTH DAILY 90 tablet 3   Calcium Carbonate-Vit D-Min (CALTRATE 600+D PLUS MINERALS) 600-800 MG-UNIT CHEW Chew 1 tablet by mouth in the morning and at bedtime.     carvedilol (COREG) 25 MG tablet Take 1/2 tablet (12.5 mg total) by mouth 2 (two) times daily with a meal. 180 tablet 2   Coenzyme Q10 (CO Q-10 PO) Take 1 capsule by mouth every morning.     Coenzyme Q10-Vitamin E (QUNOL ULTRA COQ10 PO) Take 1 capsule by mouth in the morning.     cyanocobalamin (VITAMIN B12) 1000 MCG tablet Take 1,000 mcg by mouth in the morning.     naproxen sodium (ALEVE) 220 MG tablet Taking 1-2 tablets by mouth as needed     omeprazole (PRILOSEC) 40 MG capsule TAKE ONE CAPSULE BY MOUTH TWICE DAILY (Patient taking differently: Take 80 mg by mouth daily before breakfast.) 180 capsule 0   Polyethyl Glycol-Propyl Glycol (LUBRICANT EYE DROPS) 0.4-0.3 % SOLN Place 1-2 drops into both eyes daily as needed (dry/irritated eyes.).     Psyllium (METAMUCIL PO) Take 3 tablets by mouth once a week. Metamucil Fiber Gummy     senna-docusate (SENNALAX-S) 8.6-50 MG tablet Take 2 tablets by mouth 2 (two) times daily. (Patient taking differently: Take 1 tablet by mouth at bedtime.) 120 tablet 2   No current facility-administered medications  for this encounter.    Atrial Fibrillation Management history:  Previous antiarrhythmic drugs: amiodarone Previous cardioversions:  Previous ablations: 12/07/22 Anticoagulation history: Eliquis   ROS- All systems are reviewed and negative except as per the HPI above.  Physical Exam: BP (!) 144/76   Pulse 60   Ht 5\' 2"  (1.575 m)   Wt 74.4 kg   LMP 06/20/1986   BMI 30.00 kg/m   GEN: Well nourished, well developed in no acute distress NECK: No JVD; No carotid bruits CARDIAC: Regular rate and rhythm, no murmurs, rubs, gallops RESPIRATORY:  Clear to  auscultation without rales, wheezing or rhonchi  ABDOMEN: Soft, non-tender, non-distended EXTREMITIES:  No edema; No deformity   EKG today demonstrates  Vent. rate 60 BPM PR interval 188 ms QRS duration 80 ms QT/QTcB 438/438 ms P-R-T axes 51 -2 49 Normal sinus rhythm Anteroseptal infarct , age undetermined Abnormal ECG When compared with ECG of 27-Dec-2022 10:33, PREVIOUS ECG IS PRESENT  Echo 11/09/2016 demonstrated  Study Conclusions   - Left ventricle: The cavity size was normal. Systolic function was    vigorous. The estimated ejection fraction was in the range of 65%    to 70%. Wall motion was normal; there were no regional wall    motion abnormalities. Doppler parameters are consistent with    abnormal left ventricular relaxation (grade 1 diastolic    dysfunction).  - Mitral valve: Calcified annulus.   ASSESSMENT & PLAN CHA2DS2-VASc Score = 5  The patient's score is based upon: CHF History: 0 HTN History: 1 Diabetes History: 0 Stroke History: 0 Vascular Disease History: 1 Age Score: 2 Gender Score: 1       ASSESSMENT AND PLAN: Paroxysmal Atrial Fibrillation (ICD10:  I48.0) The patient's CHA2DS2-VASc score is 5, indicating a 7.2% annual risk of stroke.   S/p Afib ablation on 12/07/22 by Dr. Elberta Fortis.  She is in NSR today. She notes dysphagia since ablation not improving. After discussion with Dr. Elberta Fortis, will order barium swallow study for further evaluation. Concern for fistula or esophageal injury. She is currently on omeprazole 40 mg BID.   Continue amiodarone 200 mg daily.   Secondary Hypercoagulable State (ICD10:  D68.69) The patient is at significant risk for stroke/thromboembolism based upon her CHA2DS2-VASc Score of 5.  Continue Apixaban (Eliquis).  No missed doses.     Follow up as scheduled with Dr. Elberta Fortis.   Lake Bells, PA-C  [facility] [address] [place], [place] [address] [phone]

## 2023-01-18 NOTE — Progress Notes (Signed)
This encounter was created in error - please disregard.  Patient got halfway through visit and informed me that she was in too much pain to continue visit.  She will call office back to reschedule AWV.

## 2023-01-19 ENCOUNTER — Ambulatory Visit: Payer: 59 | Admitting: Neurology

## 2023-02-01 ENCOUNTER — Telehealth: Payer: Self-pay | Admitting: Neurology

## 2023-02-01 ENCOUNTER — Encounter: Payer: Self-pay | Admitting: Neurology

## 2023-02-01 ENCOUNTER — Ambulatory Visit: Payer: 59 | Admitting: Neurology

## 2023-02-01 VITALS — BP 156/74 | Resp 17 | Ht 62.0 in | Wt 165.0 lb

## 2023-02-01 DIAGNOSIS — R7989 Other specified abnormal findings of blood chemistry: Secondary | ICD-10-CM | POA: Diagnosis not present

## 2023-02-01 DIAGNOSIS — I679 Cerebrovascular disease, unspecified: Secondary | ICD-10-CM | POA: Insufficient documentation

## 2023-02-01 DIAGNOSIS — R700 Elevated erythrocyte sedimentation rate: Secondary | ICD-10-CM | POA: Diagnosis not present

## 2023-02-01 DIAGNOSIS — R519 Headache, unspecified: Secondary | ICD-10-CM | POA: Diagnosis not present

## 2023-02-01 DIAGNOSIS — R7982 Elevated C-reactive protein (CRP): Secondary | ICD-10-CM | POA: Diagnosis not present

## 2023-02-01 DIAGNOSIS — E079 Disorder of thyroid, unspecified: Secondary | ICD-10-CM | POA: Diagnosis not present

## 2023-02-01 DIAGNOSIS — Z1329 Encounter for screening for other suspected endocrine disorder: Secondary | ICD-10-CM | POA: Diagnosis not present

## 2023-02-01 NOTE — Telephone Encounter (Signed)
[employer] medicare/[place] medicaid NPR sent to GI to schedule for Feb 2025 per order. [phone]

## 2023-02-01 NOTE — Progress Notes (Signed)
Chief Complaint  Patient presents with   New Patient (Initial Visit)    Rm14, alone stroke like symptoms, abnormal ZOX:WRUE located in back hip (left), fatigued, dyspnea upon exertion, abnormal gait      ASSESSMENT AND PLAN  Alexis Kline is a 77 y.o. female   Worsening headache,  Description is consistent with ice pricking headache,   MRI of the brain showed small vessel disease, radiologist also described 2 mm cortical enhancement within the right parietal lobe, suggested follow-up MRI brain, order placed, will be done about 6 months later in February 2025  ESR C-reactive protein to rule out treatable etiology  Return To Clinic With NP In 6 Months after repeat MRI of the brain with without contrast in February 2025  DIAGNOSTIC DATA (LABS, IMAGING, TESTING) - I reviewed patient records, labs, notes, testing and imaging myself where available.   MEDICAL HISTORY:  Alexis Kline is a 77 year old female, seen in request by her primary Care Physician to after Champ Mungo, for evaluation of frequent headache, initial evaluation February 01, 2023  I reviewed and summarized the referring note. PMHX HLD COPD Stroke CAD A fib HTN Used to smoke 1ppd   She lives alone, took taxi to office today, used to work as a Naval architect, suffered head injury in 1990s, 40 pounds of still object fell on the top of her head, since then, she has frequent dizziness, blurry vision, headaches,  Few months ago, besides her occasionally pressure headache, she began to experience different kind of headache, she described multiple episode of sharp transient radiating pain in different scalp, lasting few seconds to minute, not long enough for her to take any medication, but cannot catch her breath during the pain  Personally reviewed MRI of the brain with without contrast December 27, 2022, no acute abnormality, moderate small vessel disease, 2 mm cortical enhancement within the right parietal lobe,  radiology should assist a repeat MRI of the brain with without contrast  PHYSICAL EXAM:   Vitals:   02/01/23 1359  BP: (!) 156/74  Resp: 17  Weight: 165 lb (74.8 kg)  Height: 5\' 2"  (1.575 m)   Body mass index is 30.18 kg/m.  PHYSICAL EXAMNIATION:  Gen: NAD, conversant, well nourised, well groomed                     Cardiovascular: Regular rate rhythm, no peripheral edema, warm, nontender. Eyes: Conjunctivae clear without exudates or hemorrhage Neck: Supple, no carotid bruits. Pulmonary: Clear to auscultation bilaterally   NEUROLOGICAL EXAM:  MENTAL STATUS: Speech/cognition: Awake, alert, oriented to history taking and casual conversation CRANIAL NERVES: CN II: Visual fields are full to confrontation. Pupils are round equal and briskly reactive to light. OD 20/100, OS 20/100 CN III, IV, VI: extraocular movement are normal. No ptosis. CN V: Facial sensation is intact to light touch CN VII: Face is symmetric with normal eye closure  CN VIII: Hearing is normal to causal conversation. CN IX, X: Phonation is normal. CN XI: Head turning and shoulder shrug are intact  MOTOR: There is no pronator drift of out-stretched arms. Muscle bulk and tone are normal. Muscle strength is normal.  REFLEXES: Reflexes are 1 and symmetric at the biceps, triceps, knees, and ankles. Plantar responses are flexor.  SENSORY: Intact to light touch, pinprick and vibratory sensation are intact in fingers and toes.  COORDINATION: There is no trunk or limb dysmetria noted.  GAIT/STANCE: Need push-up to get up from seated position,  antalgic,  REVIEW OF SYSTEMS:  Full 14 system review of systems performed and notable only for as above All other review of systems were negative.   ALLERGIES: Allergies  Allergen Reactions   Arlice Colt Isothiocyanate] Shortness Of Breath   Iodine     Skin discoloration (turns orange)   Other     Aloe Vera and mustard "causes me to faint"   Tape     HOME  MEDICATIONS: Current Outpatient Medications  Medication Sig Dispense Refill   amiodarone (PACERONE) 200 MG tablet Take 1 tablet TWICE daily for one month, then take 1 tablet ONCE daily thereafter (Patient taking differently: Take 200 mg by mouth in the morning.) 60 tablet 3   amLODipine (NORVASC) 5 MG tablet Take 1 tablet (5 mg total) by mouth daily. (Patient taking differently: Take 5 mg by mouth every evening.) 90 tablet 1   apixaban (ELIQUIS) 5 MG TABS tablet Take 1 tablet (5 mg total) by mouth 2 (two) times daily. 60 tablet 3   aspirin EC 81 MG tablet Take 81 mg by mouth every evening. Swallow whole.     Aspirin-Caffeine (BAYER BACK & BODY PAIN EX ST) 500-32.5 MG TABS Take by mouth. Taking 1-2 tablets by mouth as needed     atorvastatin (LIPITOR) 40 MG tablet TAKE ONE TABLET BY MOUTH DAILY 90 tablet 3   Calcium Carbonate-Vit D-Min (CALTRATE 600+D PLUS MINERALS) 600-800 MG-UNIT CHEW Chew 1 tablet by mouth in the morning and at bedtime.     carvedilol (COREG) 25 MG tablet Take 1/2 tablet (12.5 mg total) by mouth 2 (two) times daily with a meal. 180 tablet 2   Coenzyme Q10 (CO Q-10 PO) Take 1 capsule by mouth every morning.     Coenzyme Q10-Vitamin E (QUNOL ULTRA COQ10 PO) Take 1 capsule by mouth in the morning.     cyanocobalamin (VITAMIN B12) 1000 MCG tablet Take 1,000 mcg by mouth in the morning.     naproxen sodium (ALEVE) 220 MG tablet Taking 1-2 tablets by mouth as needed     omeprazole (PRILOSEC) 40 MG capsule TAKE ONE CAPSULE BY MOUTH TWICE DAILY (Patient taking differently: Take 80 mg by mouth daily before breakfast.) 180 capsule 0   Polyethyl Glycol-Propyl Glycol (LUBRICANT EYE DROPS) 0.4-0.3 % SOLN Place 1-2 drops into both eyes daily as needed (dry/irritated eyes.).     Psyllium (METAMUCIL PO) Take 3 tablets by mouth once a week. Metamucil Fiber Gummy     senna-docusate (SENNALAX-S) 8.6-50 MG tablet Take 2 tablets by mouth 2 (two) times daily. (Patient taking differently: Take 1 tablet  by mouth at bedtime.) 120 tablet 2   No current facility-administered medications for this visit.    PAST MEDICAL HISTORY: 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 - based on [place] 09/2006   GERD (gastroesophageal reflux disease)    meds helps    Hemorrhoids    HLD (hyperlipidemia)    Hypertension    HYSTERECTOMY, HX OF 07/10/2006   Qualifier: Diagnosis of  By: Danae Chen     Left  flank pain 07/04/2019   Myocardial infarct, old    Pleuritic chest pain 01/01/2014   01/01/14: 4-5 month history of "clawing" sensation in her right lung that has worsened over the last month without an obvious trigger 01/02/14: CTA negative for PE   Stable angina    chronic    Stroke [facility])    many years ago ~ 30 yrs per pt     PAST SURGICAL HISTORY: Past Surgical History:  Procedure Laterality Date   ABDOMINAL HYSTERECTOMY  1988   APPENDECTOMY  1966   ATRIAL FIBRILLATION ABLATION N/A 12/07/2022   Procedure: ATRIAL FIBRILLATION ABLATION;  Surgeon: Regan Lemming, MD;  Location: [facility] INVASIVE CV LAB;  Service: Cardiovascular;  Laterality: N/A;   BLADDER SUSPENSION  1997   BREAST EXCISIONAL BIOPSY Right 1988   BREAST LUMPECTOMY Left    right breat   CARPAL TUNNEL RELEASE  1991, 1993   3 operations in right hand and 2 operations in left hand. Dr. Andrey Campanile in [place]   COLONOSCOPY  02/04/2015   Rob Bunting   COLONOSCOPY  08/17/2020   DJ   POLYPECTOMY     PTCA  2007   rectal stapling  2008   Done by Dr. Carolynne Edouard   RECTAL SURGERY  01/07/2007   Dr. Carolynne Edouard in [place].    SHOULDER SURGERY Bilateral 1991   Right shoulder surgery  in 1991 and left shoulder surgery in 1992 by Dr. Andrey Campanile in [place].    FAMILY HISTORY: Family History  Problem Relation Age of Onset   Hyperlipidemia Mother    Hypertension Mother    Hyperlipidemia Father    Hypertension Father    Heart disease Sister    Diabetes Sister    Stomach cancer Sister    Leukemia Brother    Stroke Neg Hx    Colon cancer Neg Hx    Colon polyps Neg Hx    Esophageal cancer Neg Hx    Rectal cancer Neg Hx     SOCIAL HISTORY: Social History   Socioeconomic History   Marital status: Widowed    Spouse name: Not on file   Number of children: 2   Years of education: Not on file   Highest education level: Not on file  Occupational History   Occupation: Retired  Tobacco Use   Smoking status: Former    Current packs/day: 0.00    Types: Cigarettes    Start date: 06/21/1959    Quit date: 06/20/2013    Years since quitting: 9.6   Smokeless tobacco: Never  Vaping Use   Vaping status: Never Used  Substance and Sexual Activity   Alcohol use: Not Currently    Comment: rarely "maybe a drink of New Year's"   Drug use: No   Sexual activity: Not Currently  Other Topics Concern   Not on file  Social History Narrative   One child living, patient lives alone.   Social Determinants of Health   Financial Resource Strain: Low Risk  (10/21/2021)   Overall Financial Resource Strain (CARDIA)    Difficulty of Paying Living Expenses: Not hard at all  Food Insecurity: No Food Insecurity (10/21/2021)   Hunger Vital Sign    Worried About Running Out of Food in the Last Year: Never true    Ran Out of Food in the Last Year: Never true  Transportation Needs: No Transportation Needs (10/21/2021)   PRAPARE - Administrator, Civil Service (Medical): No    Lack of Transportation (Non-Medical): No  Physical Activity: Inactive (01/18/2023)   Exercise Vital Sign    Days of Exercise per Week: 0 days    Minutes of Exercise per Session: 0 min  Stress: No Stress Concern  Present (10/21/2021)   [employer] of Occupational Health - Occupational Stress Questionnaire    Feeling of Stress : Only a little  Social Connections: Unknown (01/18/2023)   Social Connection and Isolation Panel [NHANES]    Frequency of Communication with Friends and Family: Once a week    Frequency of Social Gatherings with Friends and Family: Never    Attends Religious Services: Never    Database administrator or Organizations: No    Attends Banker Meetings: Never    Marital Status: Not on file  Intimate Partner Violence: Not At Risk (10/21/2021)   Humiliation, Afraid, Rape, and Kick questionnaire    Fear of Current or Ex-Partner: No    Emotionally Abused: No    Physically Abused: No    Sexually Abused: No      Levert Feinstein, M.D. Ph.D.  [facility] [address] [place], [place] [address] Ph: [phone] Fax: [phone]  CC:  Earl Lagos, MD [address] [place],  [place] [address]  Champ Mungo, DO

## 2023-02-02 LAB — THYROID PANEL WITH TSH
Free Thyroxine Index: 3.3 (ref 1.2–4.9)
T3 Uptake Ratio: 32 % (ref 24–39)
T4, Total: 10.2 ug/dL (ref 4.5–12.0)
TSH: 1.77 u[IU]/mL (ref 0.450–4.500)

## 2023-02-02 LAB — C-REACTIVE PROTEIN: CRP: <1 mg/L (ref 0–10)

## 2023-02-02 LAB — SEDIMENTATION RATE: Sed Rate: 3 mm/h (ref 0–40)

## 2023-02-06 ENCOUNTER — Telehealth: Payer: Self-pay | Admitting: Neurology

## 2023-02-06 NOTE — Telephone Encounter (Signed)
The order says to schedule the MRI for February 2025.

## 2023-02-06 NOTE — Telephone Encounter (Signed)
Pt asking want to know when to schedule MRI. Spoke with GI, but was not sure when MRI needed to be scheduled. Would like a call back.

## 2023-02-15 ENCOUNTER — Other Ambulatory Visit: Payer: Self-pay | Admitting: Internal Medicine

## 2023-02-15 ENCOUNTER — Other Ambulatory Visit: Payer: Self-pay | Admitting: General Practice

## 2023-02-16 NOTE — Telephone Encounter (Signed)
Prescription refill request for Eliquis received. Indication: afib  Last office visit: Nelva Bush 01/04/2023, camnitz 08/01/2022 Scr: 0.78, 11/22/2022 Age: 77 yo  Weight: 74.8 kg   Refill sent.

## 2023-03-15 ENCOUNTER — Ambulatory Visit: Payer: 59 | Admitting: Cardiology

## 2023-03-20 ENCOUNTER — Ambulatory Visit (HOSPITAL_COMMUNITY)
Admission: RE | Admit: 2023-03-20 | Discharge: 2023-03-20 | Disposition: A | Discharge status: Home / Self Care | Payer: 59 | Source: Ambulatory Visit | Attending: Internal Medicine | Admitting: Internal Medicine

## 2023-03-20 ENCOUNTER — Ambulatory Visit (HOSPITAL_COMMUNITY)
Admission: RE | Admit: 2023-03-20 | Discharge: 2023-03-20 | Disposition: A | Discharge status: Home / Self Care | Payer: 59 | Source: Ambulatory Visit | Attending: Internal Medicine

## 2023-03-20 ENCOUNTER — Ambulatory Visit: Payer: 59 | Admitting: Student

## 2023-03-20 ENCOUNTER — Encounter: Payer: Self-pay | Admitting: Student

## 2023-03-20 VITALS — BP 135/65 | HR 64 | Temp 97.9°F | Wt 164.3 lb

## 2023-03-20 DIAGNOSIS — I771 Stricture of artery: Secondary | ICD-10-CM | POA: Diagnosis not present

## 2023-03-20 DIAGNOSIS — R9431 Abnormal electrocardiogram [ECG] [EKG]: Secondary | ICD-10-CM | POA: Diagnosis not present

## 2023-03-20 DIAGNOSIS — R001 Bradycardia, unspecified: Secondary | ICD-10-CM | POA: Insufficient documentation

## 2023-03-20 DIAGNOSIS — R071 Chest pain on breathing: Secondary | ICD-10-CM

## 2023-03-20 DIAGNOSIS — R079 Chest pain, unspecified: Secondary | ICD-10-CM | POA: Diagnosis not present

## 2023-03-20 DIAGNOSIS — R0602 Shortness of breath: Secondary | ICD-10-CM | POA: Diagnosis not present

## 2023-03-20 DIAGNOSIS — R091 Pleurisy: Secondary | ICD-10-CM | POA: Insufficient documentation

## 2023-03-20 DIAGNOSIS — I700 Atherosclerosis of aorta: Secondary | ICD-10-CM | POA: Insufficient documentation

## 2023-03-20 DIAGNOSIS — Z23 Encounter for immunization: Secondary | ICD-10-CM

## 2023-03-20 DIAGNOSIS — R059 Cough, unspecified: Secondary | ICD-10-CM | POA: Diagnosis not present

## 2023-03-20 DIAGNOSIS — R058 Other specified cough: Secondary | ICD-10-CM | POA: Diagnosis not present

## 2023-03-20 LAB — D-DIMER, QUANTITATIVE: D-Dimer, Quant: 0.81 ug{FEU}/mL — ABNORMAL HIGH (ref 0.00–0.50)

## 2023-03-20 NOTE — Assessment & Plan Note (Signed)
She is reporting 1 to 2-week history of pleuritic chest/lung pain worse when she lies flat, better when she sits upright.  Pain is localized, constant, and dull-sharp in intensity. She has ongoing chronic cough, with clear sputum.  She denies any fevers or any recent infection.  She is a former smoker with a history of COPD, currently not on any inhalers.  No lower extremity edema.  Possible causes of pleuritic chest pain includes PE, pericarditis, pneumonia, or pneumothorax.  She had an EKG that was not concerning for pericarditis.  She does have a history of chronic A-fib, on Eliquis.  She endorses taking Eliquis consistently.  D-dimer was mildly elevated.  She was advised to present to the ED to get a stat chest CT to rule PE but declined at this time.  Was advised if she has worsening shortness of breath, or chest pain she needs to present to the ED or call 911.  -Follow-up results of the chest x-ray. -I will put in an order for chest CT -Follow-up with PCP as needed.

## 2023-03-20 NOTE — Progress Notes (Signed)
Subjective:  CC: Pleuritic chest/lung pain. HPI:  Alexis Kline is a 77 y.o. female with a past medical history stated below and presents today for pleuritic lung/chest pain. Please see problem based assessment and plan for additional details.  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 - based on [place] 09/2006   GERD (gastroesophageal reflux disease)    meds helps    Hemorrhoids    HLD (hyperlipidemia)    Hypertension    HYSTERECTOMY, HX OF 07/10/2006   Qualifier: Diagnosis of  By: Danae Chen     Left flank pain 07/04/2019   Myocardial infarct, old    Pleuritic chest pain 01/01/2014   01/01/14: 4-5 month history of "clawing" sensation in her right lung that has worsened over the last month without an obvious trigger 01/02/14: CTA negative for PE   Stable angina    chronic    Stroke [facility])    many years ago ~ 30 yrs per pt     Current Outpatient Medications on File Prior to Visit  Medication Sig Dispense Refill   amiodarone (PACERONE) 200 MG tablet Take 1 tablet TWICE daily for one month, then take 1 tablet ONCE daily thereafter (Patient taking differently: Take 200 mg by mouth in the morning.) 60 tablet 3   amLODipine (NORVASC) 5 MG tablet Take 1 tablet (5 mg total) by mouth daily. 90 tablet 1   apixaban (ELIQUIS) 5 MG TABS tablet TAKE ONE TABLET BY MOUTH TWICE DAILY 60 tablet 5   aspirin EC 81 MG tablet Take 81 mg by mouth every evening. Swallow whole.     Aspirin-Caffeine (BAYER BACK & BODY PAIN EX ST) 500-32.5 MG TABS Take by mouth. Taking 1-2 tablets by mouth as needed     atorvastatin (LIPITOR) 40  MG tablet TAKE ONE TABLET BY MOUTH DAILY 90 tablet 3   Calcium Carbonate-Vit D-Min (CALTRATE 600+D PLUS MINERALS) 600-800 MG-UNIT CHEW Chew 1 tablet by mouth in the morning and at bedtime.     carvedilol (COREG) 25 MG tablet Take 1/2 tablet (12.5 mg total) by mouth 2 (two) times daily with a meal. 180 tablet 2   Coenzyme Q10 (CO Q-10 PO) Take 1 capsule by mouth every morning.     Coenzyme Q10-Vitamin E (QUNOL ULTRA COQ10 PO) Take 1 capsule by mouth in the morning.     cyanocobalamin (VITAMIN B12) 1000 MCG tablet Take 1,000 mcg by mouth in the morning.     naproxen sodium (ALEVE) 220 MG tablet Taking 1-2 tablets by mouth as needed     omeprazole (PRILOSEC) 40 MG capsule Take 1 capsule (40 mg total) by mouth 2 (two) times daily. Please schedule follow up with PCP to discuss this medication. 180 capsule 0   Polyethyl Glycol-Propyl Glycol (LUBRICANT EYE DROPS) 0.4-0.3 % SOLN Place 1-2 drops into both eyes daily as needed (dry/irritated eyes.).     Psyllium (METAMUCIL PO) Take 3 tablets by mouth once a week. Metamucil Fiber Gummy     senna-docusate (SENNALAX-S) 8.6-50 MG tablet Take 2 tablets by mouth  2 (two) times daily. (Patient taking differently: Take 1 tablet by mouth at bedtime.) 120 tablet 2   No current facility-administered medications on file prior to visit.    Family History  Problem Relation Age of Onset   Hyperlipidemia Mother    Hypertension Mother    Hyperlipidemia Father    Hypertension Father    Heart disease Sister    Diabetes Sister    Stomach cancer Sister    Leukemia Brother    Stroke Neg Hx    Colon cancer Neg Hx    Colon polyps Neg Hx    Esophageal cancer Neg Hx    Rectal cancer Neg Hx     Social History   Socioeconomic History   Marital status: Widowed    Spouse name: Not on file   Number of children: 2   Years of education: Not on file   Highest education level: Not on file  Occupational History   Occupation: Retired  Tobacco Use   Smoking status: Former     Current packs/day: 0.00    Types: Cigarettes    Start date: 06/21/1959    Quit date: 06/20/2013    Years since quitting: 9.7   Smokeless tobacco: Never  Vaping Use   Vaping status: Never Used  Substance and Sexual Activity   Alcohol use: Not Currently    Comment: rarely "maybe a drink of New Year's"   Drug use: No   Sexual activity: Not Currently  Other Topics Concern   Not on file  Social History Narrative   One child living, patient lives alone.   Social Determinants of Health   Financial Resource Strain: Low Risk  (10/21/2021)   Overall Financial Resource Strain (CARDIA)    Difficulty of Paying Living Expenses: Not hard at all  Food Insecurity: No Food Insecurity (10/21/2021)   Hunger Vital Sign    Worried About Running Out of Food in the Last Year: Never true    Ran Out of Food in the Last Year: Never true  Transportation Needs: No Transportation Needs (10/21/2021)   PRAPARE - Administrator, Civil Service (Medical): No    Lack of Transportation (Non-Medical): No  Physical Activity: Inactive (01/18/2023)   Exercise Vital Sign    Days of Exercise per Week: 0 days    Minutes of Exercise per Session: 0 min  Stress: No Stress Concern Present (10/21/2021)   [employer] of Occupational Health - Occupational Stress Questionnaire    Feeling of Stress : Only a little  Social Connections: Unknown (01/18/2023)   Social Connection and Isolation Panel [NHANES]    Frequency of Communication with Friends and Family: Once a week    Frequency of Social Gatherings with Friends and Family: Never    Attends Religious Services: Never    Database administrator or Organizations: No    Attends Banker Meetings: Never    Marital Status: Not on file  Intimate Partner Violence: Not At Risk (10/21/2021)   Humiliation, Afraid, Rape, and Kick questionnaire    Fear of Current or Ex-Partner: No    Emotionally Abused: No    Physically Abused: No    Sexually Abused: No     Review of Systems: ROS negative except for what is noted on the assessment and plan.  Objective:   Vitals:   03/20/23 1305  BP: 135/65  Pulse: 64  Temp: 97.9 F (36.6 C)  TempSrc: Oral  SpO2: 100%  Weight: 164 lb 4.8 oz (  74.5 kg)    Physical Exam: Constitutional: Moaning in pain intermittently. Cardiovascular: regular rate and rhythm, no m/r/g Pulmonary/Chest: Decreased air movement in the left lower lung.  No crackles. Abdominal: soft, non-tender, non-distended MSK: normal bulk and tone  Assessment & Plan:  Pleurisy She is reporting 1 to 2-week history of pleuritic chest/lung pain worse when she lies flat, better when she sits upright.  Pain is localized, constant, and dull-sharp in intensity. She has ongoing chronic cough, with clear sputum.  She denies any fevers or any recent infection.  She is a former smoker with a history of COPD, currently not on any inhalers.  No lower extremity edema.  Possible causes of pleuritic chest pain includes PE, pericarditis, pneumonia, or pneumothorax.  She had an EKG that was not concerning for pericarditis.  She does have a history of chronic A-fib, on Eliquis.  She endorses taking Eliquis consistently.  D-dimer was mildly elevated.  She was advised to present to the ED to get a stat chest CT to rule PE but declined at this time.  Was advised if she has worsening shortness of breath, or chest pain she needs to present to the ED or call 911.  -Follow-up results of the chest x-ray. -I will put in an order for chest CT -Follow-up with PCP as needed.    Patient seen with Dr. Kizzie Ide, MD [facility] Internal Medicine  PGY-1 Pager: [phone]  Date 03/20/2023  Time 4:49 PM

## 2023-03-20 NOTE — Patient Instructions (Signed)
Thank you, Ms.[name] for allowing [place] to provide your care today.   Today we discussed :  1.  Pleuritic lung pain.  We advised that you present to the ED to get a CT of the chest so that we can rule out a clot in your lungs.  We understand you do not want to go to the ED, we will put in an order so he can get the CT at another time.  If you have worsening shortness of breath, or difficulty breathing please call 911 or present to the nearest ED.   I have ordered the following labs for you:   Lab Orders         D-dimer, quantitative (not at [facility])        Follow up: 1 month follow-up to discuss abnormal bone marrow density   Should you have any questions or concerns please call the internal medicine clinic at [phone].    Laretta Bolster, MD  [facility]

## 2023-03-20 NOTE — Progress Notes (Signed)
Internal Medicine Clinic Attending  I was physically present during the key portions of the resident provided service and participated in the medical decision making of patient's management care. I reviewed pertinent patient test results.  The assessment, diagnosis, and plan were formulated together and I agree with the documentation in the resident's note.  Patient here with acute / subacute left sided pleuritic chest pain. She localizes the pain to her posterior left lung base. She appears very uncomfortable but in so significant respiratory distress. EKG shows a remote inferior infarct but no acute changes from prior. She is unable to take deep breaths because of the pain. Lung ascultation is limited by this. She also likely has untreated COPD which could contribute to poor air movement.   D-dimer has resulted mildly positive. She does take Eliquis for her atrial fibrillation. Will recommend she present to the ED for CTA chest to rule out PE.   Lastly, the visit was originally scheduled to follow up on her abnormal DEXA scan. We have deferred discussion about bisphosphonate therapy given her acute issues. Please have patient schedule another visit for this.   Reymundo Poll, MD

## 2023-03-21 ENCOUNTER — Other Ambulatory Visit: Payer: Self-pay | Admitting: Cardiology

## 2023-03-22 NOTE — Addendum Note (Signed)
Addended by: Laretta Bolster on: 03/22/2023 09:26 AM   Modules accepted: Orders

## 2023-03-31 ENCOUNTER — Ambulatory Visit (HOSPITAL_COMMUNITY): Payer: 59

## 2023-04-05 ENCOUNTER — Ambulatory Visit (HOSPITAL_COMMUNITY)
Admission: RE | Admit: 2023-04-05 | Discharge: 2023-04-05 | Disposition: A | Discharge status: Home / Self Care | Payer: 59 | Source: Ambulatory Visit | Attending: Internal Medicine | Admitting: Internal Medicine

## 2023-04-05 DIAGNOSIS — R071 Chest pain on breathing: Secondary | ICD-10-CM | POA: Diagnosis not present

## 2023-04-05 DIAGNOSIS — D3502 Benign neoplasm of left adrenal gland: Secondary | ICD-10-CM | POA: Diagnosis not present

## 2023-04-19 ENCOUNTER — Ambulatory Visit: Payer: 59

## 2023-04-19 VITALS — Wt 160.0 lb

## 2023-04-19 DIAGNOSIS — Z0000 Encounter for general adult medical examination without abnormal findings: Secondary | ICD-10-CM

## 2023-04-19 NOTE — Patient Instructions (Signed)
Alexis Kline , Thank you for taking time to come for your Medicare Wellness Visit. I appreciate your ongoing commitment to your health goals. Please review the following plan we discussed and let me know if I can assist you in the future.   Referrals/Orders/Follow-Ups/Clinician Recommendations: You are due for a Shingles vaccine, a Tetanus vaccine, and a Covid vaccine, you can get these done at your local pharmacy.  I also requested some a community resource referral to help for transportation.  Please listen out as someone will be calling you in regards to help.  Each day, aim for 6 glasses of water, plenty of protein in your diet and try to get up and walk/ stretch every hour for 5-10 minutes at a time.    This is a list of the screening recommended for you and due dates:  Health Maintenance  Topic Date Due   Zoster (Shingles) Vaccine (1 of 2) Never done   DTaP/Tdap/Td vaccine (2 - Td or Tdap) 11/15/2021   COVID-19 Vaccine (4 - 2023-24 season) 02/19/2023   Colon Cancer Screening  08/18/2023   Medicare Annual Wellness Visit  04/18/2024   Pneumonia Vaccine  Completed   Flu Shot  Completed   DEXA scan (bone density measurement)  Completed   Hepatitis C Screening  Completed   HPV Vaccine  Aged Out    Advanced directives: (Copy Requested) Please bring a copy of your health care power of attorney and living will to the office to be added to your chart at your convenience.  Next Medicare Annual Wellness Visit scheduled for next year: Yes

## 2023-04-19 NOTE — Progress Notes (Signed)
Subjective:   Alexis Kline is a 77 y.o. female who presents for Medicare Annual (Subsequent) preventive examination.  Visit Complete: Virtual I connected with  Alexis Kline on 04/19/23 by a audio enabled telemedicine application and verified that I am speaking with the correct person using two identifiers.  Patient Location: Home  Provider Location: Home Office  I discussed the limitations of evaluation and management by telemedicine. The patient expressed understanding and agreed to proceed.  Vital Signs: Because this visit was a virtual/telehealth visit, some criteria may be missing or patient reported. Any vitals not documented were not able to be obtained and vitals that have been documented are patient reported.   Cardiac Risk Factors include: advanced age (>50men, >10 women);hypertension;Other (see comment);dyslipidemia, Risk factor comments: CAD, A-Fib     Objective:    Today's Vitals   04/19/23 1120  Weight: 160 lb (72.6 kg)   Body mass index is 29.26 kg/m.     04/19/2023   11:26 AM 03/20/2023    1:15 PM 01/18/2023    9:24 AM 12/27/2022    9:26 AM 12/07/2022   10:34 AM 04/21/2022    1:38 PM 10/21/2021   12:45 PM  Advanced Directives  Does Patient Have a Medical Advance Directive? No No Yes Yes Yes No No  Type of Chief of Staff of [employer] Power of [place];Living will    Does patient want to make changes to medical advance directive?     No - Patient declined    Copy of Healthcare Power of Attorney in Chart?     No - copy requested    Would patient like information on creating a medical advance directive?  No - Patient declined    Yes (MAU/Ambulatory/Procedural Areas - Information given) Yes (MAU/Ambulatory/Procedural Areas - Information given)    Current Medications (verified) Outpatient Encounter Medications as of 04/19/2023  Medication Sig   amiodarone (PACERONE) 200 MG tablet Take 1  tablet (200 mg total) by mouth daily.   amLODipine (NORVASC) 5 MG tablet Take 1 tablet (5 mg total) by mouth daily.   apixaban (ELIQUIS) 5 MG TABS tablet TAKE ONE TABLET BY MOUTH TWICE DAILY   aspirin EC 81 MG tablet Take 81 mg by mouth every evening. Swallow whole.   Aspirin-Caffeine (BAYER BACK & BODY PAIN EX ST) 500-32.5 MG TABS Take by mouth. Taking 1-2 tablets by mouth as needed   atorvastatin (LIPITOR) 40 MG tablet TAKE ONE TABLET BY MOUTH DAILY   Calcium Carbonate-Vit D-Min (CALTRATE 600+D PLUS MINERALS) 600-800 MG-UNIT CHEW Chew 1 tablet by mouth in the morning and at bedtime.   carvedilol (COREG) 25 MG tablet Take 1/2 tablet (12.5 mg total) by mouth 2 (two) times daily with a meal.   Coenzyme Q10-Vitamin E (QUNOL ULTRA COQ10 PO) Take 1 capsule by mouth in the morning.   cyanocobalamin (VITAMIN B12) 1000 MCG tablet Take 1,000 mcg by mouth in the morning.   naproxen sodium (ALEVE) 220 MG tablet Taking 1-2 tablets by mouth as needed   omeprazole (PRILOSEC) 40 MG capsule Take 1 capsule (40 mg total) by mouth 2 (two) times daily. Please schedule follow up with PCP to discuss this medication.   Polyethyl Glycol-Propyl Glycol (LUBRICANT EYE DROPS) 0.4-0.3 % SOLN Place 1-2 drops into both eyes daily as needed (dry/irritated eyes.).   Psyllium (METAMUCIL PO) Take 3 tablets by mouth once a week. Metamucil Fiber Gummy   senna-docusate (SENNALAX-S) 8.6-50 MG tablet Take  2 tablets by mouth 2 (two) times daily. (Patient taking differently: Take 1 tablet by mouth at bedtime.)   Coenzyme Q10 (CO Q-10 PO) Take 1 capsule by mouth every morning.   No facility-administered encounter medications on file as of 04/19/2023.    Allergies (verified) Mustard [allyl isothiocyanate], Iodine, Other, and Tape   History: Past Medical History:  Diagnosis Date   Acute pain of left shoulder 02/22/2017   APPENDECTOMY, HX OF 07/10/2006   Qualifier: Diagnosis of  By: Danae Chen     Arthritis    back, hands,  legs , feet    CAD (coronary artery disease) 2007   nonobstructive, 30% LAD 02/2006   COPD (chronic obstructive pulmonary disease) (HCC)    Diarrhea 04/12/2019   Fever 04/10/2020   Fibroadenoma 2008   right breast - based on [place] 09/2006   GERD (gastroesophageal reflux disease)    meds helps    Hemorrhoids    HLD (hyperlipidemia)    Hypertension    HYSTERECTOMY, HX OF 07/10/2006   Qualifier: Diagnosis of  By: Landis Martins DO, Elizabeth     Left flank pain 07/04/2019   Myocardial infarct, old    Pleuritic chest pain 01/01/2014   01/01/14: 4-5 month history of "clawing" sensation in her right lung that has worsened over the last month without an obvious trigger 01/02/14: CTA negative for PE   Stable angina (HCC)    chronic    Stroke (HCC)    many years ago ~ 30 yrs per pt    Past Surgical History:  Procedure Laterality Date   ABDOMINAL HYSTERECTOMY  1988   APPENDECTOMY  1966   ATRIAL FIBRILLATION ABLATION N/A 12/07/2022   Procedure: ATRIAL FIBRILLATION ABLATION;  Surgeon: Regan Lemming, MD;  Location: [facility];  Service: Cardiovascular;  Laterality: N/A;   BLADDER SUSPENSION  1997   BREAST EXCISIONAL BIOPSY Right 1988   BREAST LUMPECTOMY Left    right breat   CARPAL TUNNEL RELEASE  1991, 1993   3 operations in right hand and 2 operations in left hand. Dr. Andrey Campanile in [place]   COLONOSCOPY  02/04/2015   Rob Bunting   COLONOSCOPY  08/17/2020   DJ   POLYPECTOMY     PTCA  2007   rectal stapling  2008   Done by Dr. Carolynne Edouard   RECTAL SURGERY  01/07/2007   Dr. Carolynne Edouard in [place].    SHOULDER SURGERY Bilateral 1991   Right shoulder surgery in 1991 and left shoulder surgery in 1992 by Dr. Andrey Campanile in [place].   Family History  Problem Relation Age of Onset   Hyperlipidemia Mother    Hypertension Mother    Hyperlipidemia Father    Hypertension Father    Heart disease Sister    Diabetes Sister    Stomach cancer Sister    Leukemia Brother    Stroke Neg Hx    Colon cancer Neg  Hx    Colon polyps Neg Hx    Esophageal cancer Neg Hx    Rectal cancer Neg Hx    Social History   Socioeconomic History   Marital status: Widowed    Spouse name: Not on file   Number of children: 2   Years of education: Not on file   Highest education level: Not on file  Occupational History   Occupation: Retired  Tobacco Use   Smoking status: Former    Current packs/day: 0.00    Types: Cigarettes    Start date: 06/21/1959  Quit date: 06/20/2013    Years since quitting: 9.8   Smokeless tobacco: Never  Vaping Use   Vaping status: Never Used  Substance and Sexual Activity   Alcohol use: Not Currently    Comment: rarely "maybe a drink of New Year's"   Drug use: No   Sexual activity: Not Currently  Other Topics Concern   Not on file  Social History Narrative   One child living, patient lives alone.   Social Determinants of Health   Financial Resource Strain: Low Risk  (04/19/2023)   Overall Financial Resource Strain (CARDIA)    Difficulty of Paying Living Expenses: Not hard at all  Food Insecurity: No Food Insecurity (04/19/2023)   Hunger Vital Sign    Worried About Running Out of Food in the Last Year: Never true    Ran Out of Food in the Last Year: Never true  Transportation Needs: Unmet Transportation Needs (04/19/2023)   PRAPARE - Administrator, Civil Service (Medical): Yes    Lack of Transportation (Non-Medical): Yes  Physical Activity: Insufficiently Active (04/19/2023)   Exercise Vital Sign    Days of Exercise per Week: 3 days    Minutes of Exercise per Session: 20 min  Stress: No Stress Concern Present (04/19/2023)   [employer] of Occupational Health - Occupational Stress Questionnaire    Feeling of Stress : Not at all  Social Connections: Socially Isolated (04/19/2023)   Social Connection and Isolation Panel [NHANES]    Frequency of Communication with Friends and Family: Never    Frequency of Social Gatherings with Friends and Family:  Never    Attends Religious Services: Never    Database administrator or Organizations: No    Attends Banker Meetings: Never    Marital Status: Widowed    Tobacco Counseling Counseling given: Not Answered   Clinical Intake:  Pre-visit preparation completed: Yes  Pain : No/denies pain     Nutritional Risks: None Diabetes: No  How often do you need to have someone help you when you read instructions, pamphlets, or other written materials from your doctor or pharmacy?: 1 - Never  Interpreter Needed?: No  Information entered by :: Sabena Winner, RMA   Activities of Daily Living    04/19/2023   11:23 AM 03/20/2023    1:10 PM  In your present state of health, do you have any difficulty performing the following activities:  Hearing? 0 0  Vision? 0 0  Difficulty concentrating or making decisions? 0 0  Walking or climbing stairs? 0 1  Dressing or bathing? 0 0  Doing errands, shopping? 1 1  Comment public transportation, (bus or [employer])   Quarry manager and eating ? N   Using the Toilet? N   In the past six months, have you accidently leaked urine? N   Do you have problems with loss of bowel control? N   Managing your Medications? N   Managing your Finances? N   Housekeeping or managing your Housekeeping? N     Patient Care Team: Champ Mungo, DO as PCP - General Lennette Bihari, MD as PCP - Cardiology (Cardiology) Regan Lemming, MD as PCP - Electrophysiology (Cardiology)  Indicate any recent Medical Services you may have received from other than [facility] providers in the past year (date may be approximate).     Assessment:   This is a routine wellness examination for Tayjah.  Hearing/Vision screen Hearing Screening - Comments:: Denies hearing difficulties  Vision Screening - Comments:: Wears eyeglasses   Goals Addressed               This Visit's Progress     Patient Stated (pt-stated)        Being able to walk better.       Depression Screen    04/19/2023   11:33 AM 12/27/2022    9:24 AM 04/21/2022    1:41 PM 10/21/2021    1:12 PM 08/10/2021    2:22 PM 07/20/2021    4:35 PM 03/24/2021    9:11 AM  PHQ 2/9 Scores  PHQ - 2 Score 1 0 0 0 0 4 0  PHQ- 9 Score 1  10 0 0 10 1    Fall Risk    04/19/2023   11:26 AM 03/20/2023    1:10 PM 01/18/2023    9:25 AM 01/18/2023    9:24 AM 12/27/2022    9:24 AM  Fall Risk   Falls in the past year? 1 1 1 1 1   Number falls in past yr: 1 1  1 1   Comment knees just give out      Injury with Fall? 0 0   1  Risk for fall due to :  No Fall Risks   Impaired balance/gait  Follow up Falls evaluation completed;Falls prevention discussed Falls evaluation completed   Falls prevention discussed;Falls evaluation completed    MEDICARE RISK AT HOME: Medicare Risk at Home Any stairs in or around the home?: No Home free of loose throw rugs in walkways, pet beds, electrical cords, etc?: Yes Adequate lighting in your home to reduce risk of falls?: Yes Life alert?: No Use of a cane, walker or w/c?: Yes (cane) Grab bars in the bathroom?: Yes Shower chair or bench in shower?: No Elevated toilet seat or a handicapped toilet?: No  TIMED UP AND GO:  Was the test performed?  No    Cognitive Function:        04/19/2023   11:28 AM 01/18/2023    9:25 AM 10/21/2021    1:10 PM  6CIT Screen  What Year? 0 points 0 points 0 points  What month? 0 points 0 points 0 points  What time? 0 points 0 points 0 points  Count back from 20 0 points 0 points 0 points  Months in reverse 0 points 4 points 0 points  Repeat phrase 0 points  0 points  Total Score 0 points  0 points    Immunizations Immunization History  Administered Date(s) Administered   Fluad Quad(high Dose 65+) 03/24/2021, 04/21/2022   Fluad Trivalent(High Dose 65+) 03/20/2023   Influenza Whole 04/14/2008   Influenza,inj,Quad PF,6+ Mos 04/08/2013, 02/25/2014, 03/09/2015, 03/24/2016, 02/22/2017, 02/13/2018, 03/25/2019, 03/30/2020    Influenza,inj,quad, With Preservative 04/16/2018   PFIZER(Purple Top)SARS-COV-2 Vaccination 08/03/2019, 08/28/2019, 04/02/2020   Pneumococcal Conjugate-13 05/04/2015   Pneumococcal Polysaccharide-23 11/16/2011   Tdap 11/16/2011    TDAP status: Due, Education has been provided regarding the importance of this vaccine. Advised may receive this vaccine at local pharmacy or Health Dept. Aware to provide a copy of the vaccination record if obtained from local pharmacy or Health Dept. Verbalized acceptance and understanding.  Flu Vaccine status: Up to date  Pneumococcal vaccine status: Up to date  Covid-19 vaccine status: Information provided on how to obtain vaccines.   Qualifies for Shingles Vaccine? Yes   Zostavax completed No   Shingrix Completed?: No.    Education has been provided regarding the importance of this vaccine.  Patient has been advised to call insurance company to determine out of pocket expense if they have not yet received this vaccine. Advised may also receive vaccine at local pharmacy or Health Dept. Verbalized acceptance and understanding.  Screening Tests Health Maintenance  Topic Date Due   Zoster Vaccines- Shingrix (1 of 2) Never done   DTaP/Tdap/Td (2 - Td or Tdap) 11/15/2021   COVID-19 Vaccine (4 - 2023-24 season) 02/19/2023   Colonoscopy  08/18/2023   Medicare Annual Wellness (AWV)  04/18/2024   Pneumonia Vaccine 42+ Years old  Completed   INFLUENZA VACCINE  Completed   DEXA SCAN  Completed   Hepatitis C Screening  Completed   HPV VACCINES  Aged Out    Health Maintenance  Health Maintenance Due  Topic Date Due   Zoster Vaccines- Shingrix (1 of 2) Never done   DTaP/Tdap/Td (2 - Td or Tdap) 11/15/2021   COVID-19 Vaccine (4 - 2023-24 season) 02/19/2023   Colonoscopy  08/18/2023    Colorectal cancer screening: Type of screening: Colonoscopy. Completed 08/17/2020. Repeat every 3 years  Mammogram status: No longer required due to age.  Bone Density  status: Completed 09/29/2022. Results reflect: Bone density results: OSTEOPOROSIS. Repeat every 2 years.  Lung Cancer Screening: (Low Dose CT Chest recommended if Age 56-80 years, 20 pack-year currently smoking OR have quit w/in 15years.) does not qualify.   Lung Cancer Screening Referral: N/A  Additional Screening:  Hepatitis C Screening: does qualify; Completed 03/30/2020  Vision Screening: Recommended annual ophthalmology exams for early detection of glaucoma and other disorders of the eye. Is the patient up to date with their annual eye exam?  No  Who is the provider or what is the name of the office in which the patient attends annual eye exams? Dr. Andrey Farmer would like to switch.   If pt is not established with a provider, would they like to be referred to a provider to establish care? Yes .   Dental Screening: Recommended annual dental exams for proper oral hygiene   Community Resource Referral / Chronic Care Management: CRR required this visit?  Yes   CCM required this visit?  No     Plan:     I have personally reviewed and noted the following in the patient's chart:   Medical and social history Use of alcohol, tobacco or illicit drugs  Current medications and supplements including opioid prescriptions. Patient is not currently taking opioid prescriptions. Functional ability and status Nutritional status Physical activity Advanced directives List of other physicians Hospitalizations, surgeries, and ER visits in previous 12 months Vitals Screenings to include cognitive, depression, and falls Referrals and appointments  In addition, I have reviewed and discussed with patient certain preventive protocols, quality metrics, and best practice recommendations. A written personalized care plan for preventive services as well as general preventive health recommendations were provided to patient.     Gatlyn Lipari L Hannah Strader, CMA   04/19/2023   After Visit Summary: (MyChart)  Due to this being a telephonic visit, the after visit summary with patients personalized plan was offered to patient via MyChart   Nurse Notes: Patient is due for a Tetanus, Shingrix and Covid vaccine.  She would like a recommendation to a different eye doctor.  She is currently at [facility].  Patient stated that she is in need of transportation because the pain in her knees are keeping her from walking in a distance. She had no other concerns to address today.

## 2023-04-25 ENCOUNTER — Ambulatory Visit: Payer: 59 | Attending: Cardiology | Admitting: Cardiology

## 2023-04-25 ENCOUNTER — Encounter: Payer: Self-pay | Admitting: Cardiology

## 2023-04-25 VITALS — BP 118/80 | HR 64 | Ht 62.0 in | Wt 165.0 lb

## 2023-04-25 DIAGNOSIS — I480 Paroxysmal atrial fibrillation: Secondary | ICD-10-CM

## 2023-04-25 DIAGNOSIS — I495 Sick sinus syndrome: Secondary | ICD-10-CM

## 2023-04-25 DIAGNOSIS — D6869 Other thrombophilia: Secondary | ICD-10-CM

## 2023-04-25 DIAGNOSIS — I10 Essential (primary) hypertension: Secondary | ICD-10-CM

## 2023-04-25 DIAGNOSIS — I2510 Atherosclerotic heart disease of native coronary artery without angina pectoris: Secondary | ICD-10-CM | POA: Diagnosis not present

## 2023-04-25 NOTE — Progress Notes (Signed)
  Electrophysiology Office Note:   Date:  04/25/2023  ID:  Alexis Kline, DOB 06-Mar-1946, MRN [id number]  Primary Cardiologist: Alexis Guadalajara, MD Electrophysiologist: Alexis Lemming, MD      History of Present Illness:   Alexis Kline is a 77 y.o. female with h/o hypertension, hyperlipidemia, coronary disease, prior tobacco abuse, atrial fibrillation post ablation 12/07/2022 seen today for routine electrophysiology followup.   Since last being seen in our clinic the patient reports doing overall well.  She has noted minimal episodes of atrial fibrillation since her ablation.  She continues to do her daily activities without restriction.  she denies chest pain, palpitations, dyspnea, PND, orthopnea, nausea, vomiting, dizziness, syncope, edema, weight gain, or early satiety.   Review of systems complete and found to be negative unless listed in HPI.   EP Information / Studies Reviewed:    EKG is ordered today. Personal review as below.  EKG Interpretation Date/Time:  Tuesday April 25 2023 11:45:26 EST Ventricular Rate:  64 PR Interval:  186 QRS Duration:  86 QT Interval:  430 QTC Calculation: 443 R Axis:   0  Text Interpretation: Normal sinus rhythm Possible Anterior infarct (cited on or before 27-Dec-2022) When compared with ECG of 20-Mar-2023 14:08, No significant change was found Confirmed by Alexis Kline ([id number]) on 04/25/2023 11:50:34 AM    Risk Assessment/Calculations:    CHA2DS2-VASc Score = 5   This indicates a 7.2% annual risk of stroke. The patient's score is based upon: CHF History: 0 HTN History: 1 Diabetes History: 0 Stroke History: 0 Vascular Disease History: 1 Age Score: 2 Gender Score: 1             Physical Exam:   VS:  BP 118/80 (BP Location: Left Arm, Patient Position: Sitting, Cuff Size: Normal)   Pulse 64   Ht 5\' 2"  (1.575 m)   Wt 165 lb (74.8 kg)   LMP 06/20/1986   SpO2 98%   BMI 30.18 kg/m    Wt Readings from Last 3  Encounters:  04/25/23 165 lb (74.8 kg)  04/19/23 160 lb (72.6 kg)  03/20/23 164 lb 4.8 oz (74.5 kg)     GEN: Well nourished, well developed in no acute distress NECK: No JVD; No carotid bruits CARDIAC: Regular rate and rhythm, no murmurs, rubs, gallops RESPIRATORY:  Clear to auscultation without rales, wheezing or rhonchi  ABDOMEN: Soft, non-tender, non-distended EXTREMITIES:  No edema; No deformity   ASSESSMENT AND PLAN:    1.  Paroxysmal atrial fibrillation: Currently on amiodarone.  Post ablation 12/07/2022.  Remains in normal rhythm.  Alexis Kline stop amiodarone today.  2.  Secondary hypercoagulable state: Currently on Eliquis for atrial fibrillation  3.  Coronary artery disease: Mild and nonobstructive.  Plan per primary cardiology  4.  Hypertension: Currently well-controlled  Follow up with Afib Clinic in 6 months  Signed, Alexis Kline Jorja Loa, MD

## 2023-04-25 NOTE — Patient Instructions (Signed)
Medication Instructions:  STOP Amiodarone *If you need a refill on your cardiac medications before your next appointment, please call your pharmacy*   Follow-Up: At [facility], you and your health needs are our priority.  As part of our continuing mission to provide you with exceptional heart care, we have created designated Provider Care Teams.  These Care Teams include your primary Cardiologist (physician) and Advanced Practice Providers (APPs -  Physician Assistants and Nurse Practitioners) who all work together to provide you with the care you need, when you need it.  We recommend signing up for the patient portal called "MyChart".  Sign up information is provided on this After Visit Summary.  MyChart is used to connect with patients for Virtual Visits (Telemedicine).  Patients are able to view lab/test results, encounter notes, upcoming appointments, etc.  Non-urgent messages can be sent to your provider as well.   To learn more about what you can do with MyChart, go to [email or web address].    Your next appointment:   6 month(s)  Provider:   You will follow up in the Atrial Fibrillation Clinic located at [facility]. Your provider will be: Clint R. Fenton, PA-C or Lake Bells, PA-C

## 2023-05-03 ENCOUNTER — Ambulatory Visit: Payer: 59 | Admitting: Cardiovascular Disease

## 2023-05-15 ENCOUNTER — Other Ambulatory Visit: Payer: Self-pay | Admitting: Internal Medicine

## 2023-05-15 DIAGNOSIS — I10 Essential (primary) hypertension: Secondary | ICD-10-CM

## 2023-07-14 ENCOUNTER — Other Ambulatory Visit: Payer: Self-pay | Admitting: Internal Medicine

## 2023-07-14 DIAGNOSIS — Z1231 Encounter for screening mammogram for malignant neoplasm of breast: Secondary | ICD-10-CM

## 2023-08-02 ENCOUNTER — Other Ambulatory Visit: Payer: Self-pay | Admitting: Internal Medicine

## 2023-08-02 ENCOUNTER — Ambulatory Visit: Payer: 59 | Admitting: Cardiovascular Disease

## 2023-08-02 DIAGNOSIS — I10 Essential (primary) hypertension: Secondary | ICD-10-CM

## 2023-08-04 ENCOUNTER — Other Ambulatory Visit: Payer: 59

## 2023-08-07 ENCOUNTER — Ambulatory Visit: Payer: 59 | Admitting: Cardiology

## 2023-08-16 ENCOUNTER — Encounter: Payer: Self-pay | Admitting: Cardiovascular Disease

## 2023-08-16 ENCOUNTER — Ambulatory Visit: Payer: 59 | Attending: Cardiovascular Disease | Admitting: Cardiovascular Disease

## 2023-08-16 DIAGNOSIS — I2510 Atherosclerotic heart disease of native coronary artery without angina pectoris: Secondary | ICD-10-CM

## 2023-08-16 DIAGNOSIS — Z9889 Other specified postprocedural states: Secondary | ICD-10-CM

## 2023-08-16 DIAGNOSIS — Z8679 Personal history of other diseases of the circulatory system: Secondary | ICD-10-CM

## 2023-08-16 DIAGNOSIS — D6869 Other thrombophilia: Secondary | ICD-10-CM

## 2023-08-16 DIAGNOSIS — I480 Paroxysmal atrial fibrillation: Secondary | ICD-10-CM | POA: Diagnosis not present

## 2023-08-16 DIAGNOSIS — E782 Mixed hyperlipidemia: Secondary | ICD-10-CM

## 2023-08-16 DIAGNOSIS — Z87891 Personal history of nicotine dependence: Secondary | ICD-10-CM | POA: Diagnosis not present

## 2023-08-16 DIAGNOSIS — I495 Sick sinus syndrome: Secondary | ICD-10-CM

## 2023-08-16 DIAGNOSIS — I10 Essential (primary) hypertension: Secondary | ICD-10-CM

## 2023-08-16 NOTE — Patient Instructions (Addendum)
 Medication Instructions:  Continue to check your blood pressure.   You may take an additional half of the Carvedilol 25mg  if you are experiencing palpitations.   *If you need a refill on your cardiac medications before your next appointment, please call your pharmacy*   Lab Work: Return for fasting labs If you have labs (blood work) drawn today and your tests are completely normal, you will receive your results only by: MyChart Message (if you have MyChart) OR A paper copy in the mail If you have any lab test that is abnormal or we need to change your treatment, we will call you to review the results.   Testing/Procedures: Your physician has requested that you have an echocardiogram. Echocardiography is a painless test that uses sound waves to create images of your heart. It provides your doctor with information about the size and shape of your heart and how well your heart's chambers and valves are working. This procedure takes approximately one hour. There are no restrictions for this procedure. Please do NOT wear cologne, perfume, aftershave, or lotions (deodorant is allowed). Please arrive 15 minutes prior to your appointment time.  Please note: We ask at that you not bring children with you during ultrasound (echo/ vascular) testing. Due to room size and safety concerns, children are not allowed in the ultrasound rooms during exams. Our front office staff cannot provide observation of children in our lobby area while testing is being conducted. An adult accompanying a patient to their appointment will only be allowed in the ultrasound room at the discretion of the ultrasound technician under special circumstances. We apologize for any inconvenience.    Follow-Up: At [facility], you and your health needs are our priority.  As part of our continuing mission to provide you with exceptional heart care, we have created designated Provider Care Teams.  These Care Teams include  your primary Cardiologist (physician) and Advanced Practice Providers (APPs -  Physician Assistants and Nurse Practitioners) who all work together to provide you with the care you need, when you need it.  We recommend signing up for the patient portal called "MyChart".  Sign up information is provided on this After Visit Summary.  MyChart is used to connect with patients for Virtual Visits (Telemedicine).  Patients are able to view lab/test results, encounter notes, upcoming appointments, etc.  Non-urgent messages can be sent to your provider as well.   To learn more about what you can do with MyChart, go to [email or web address].    Your next appointment:   3 month(s)  Provider:   Bernadene Person, NP        Other Instructions        1st Floor: - Lobby - Registration  - Pharmacy  - Lab - Cafe   2nd Floor: - PV Lab - Diagnostic Testing (echo, CT, nuclear med)   3rd Floor: - Vacant   4th Floor: - TCTS (cardiothoracic surgery) - AFib Clinic - Structural Heart Clinic - Vascular Surgery  - Vascular Ultrasound   5th Floor: - HeartCare Cardiology (general and EP) - Clinical Pharmacy for coumadin, hypertension, lipid, weight-loss medications, and med management appointments      Valet parking services will be available as well.

## 2023-08-16 NOTE — Progress Notes (Signed)
 Cardiology Office Note    Date:  08/16/2023   ID:  Jady, Braggs [birth year], MRN [id number]  PCP:  Champ Mungo, DO  Cardiologist:  Nicki Guadalajara, MD  EP: Dr. Elberta Fortis  31 month F/U cardiology evaluation   History of Present Illness:  Alexis Kline is a 78 y.o. female who presents for an 55 month follow-up cardiology evaluation  Alexis Kline  has a greater than 30 year history of hypertension, a history of hyperlipidemia, as well as GERD.  She also has a history of osteopenia.  Recently, she developed a bandlike chest pressure around her upper chest without radiation that occurred at rest and lasted for 15-20 minutes with spontaneous resolution.  Her symptoms were associated with shortness of breath and she felt that she could not catch her breath.  She denies any classic exertional symptomatology.  Reportedly also has a history of chronic stable angina, and remotely had undergone cardiac catheterization in 2012 .  She was found to have 30% areas of segmental disease in her LAD.  Smoking cessation was advised.  She was treated medically.  She has a history of bilateral neck pain radiating to her shoulders and has chronic cervical of musculoskeletal discomfort from traumatic injury over 20 years ago.  She was recently evaluated by Dr. Lawerance Bach and with her recent chest pain symptomatology she is now referred for cardiology evaluation.  Of note, the patient states she drinks at least 8 cups of coffee per day.  She admits to occasional palpitations.  When I initially saw her, we discussed significant reduction in caffeine intake.  Her blood pressure was upper normal in width.  Her chest pain.  I recommended titration of carvedilol to 18.75 mg twice a day.  She underwent an echo Doppler study on 11/09/2016 which showed hyperdynamic LV function with an EF of 65-70%.  There was grade 1 diastolic dysfunction.  There was mild mitral annular calcification.  A nuclear perfusion study  showed an EF of 81%.  There were no ECG changes during stress.  She had normal perfusion.  Laboratory showed a hemoglobin of 12.7 and hematocrit of 40.8.  TSH was normal at 1.49.   I saw her in November 2018.  At that time she was under significant stress to her daughter who is 57 years old had just been diagnosed with stage IV lung cancer on April 19, 2017.  Unfortunately, her daughter died on 25-Jun-2017.  I saw her in September 2019 at which time she had noticed recent significant blood pressure lability.  She has not had recent laboratory.  She denies any episodes of chest tightness or significant shortness of breath.  She has been on hydrochlorothiazide 25 mg and carvedilol 25 mg twice a day for blood pressure.  She was on atorvastatin 40 mg daily and has GERD on omeprazole.   During that evaluation, her blood pressure was significantly elevated at 201/103.  Her daughter had recently passed away.  I added amlodipine 5 mg initially to her medical regimen.  She was seen on May 07, 2018 and follow-up by Zonia Kief and blood pressure was significantly improved at 118/68.   She was evaluated in a telemedicine evaluation on November 23, 2018.  Recently, her blood pressure has continued to be fairly well controlled but at times may still increase slightly.  She had noticed some occasional chest fluttering's of short duration.  She has reduced her caffeine intake.  She admits to  mild swelling around her right ankle.  She denies chest pain PND orthopnea she denies exertional dyspnea.    I saw her on July 09, 2019 and since her prior evaluation she had felt well.  She specifically denied any chest pain or shortness of breath.  She walks with a cane.  She has continued to be on amlodipine 5 mg, carvedilol 25 mg twice a day, and hydrochlorothiazide 25 mg daily for hypertension.  She is on atorvastatin 40 mg for hyperlipidemia.  She continues to be on omeprazole for GERD.  During that evaluation  her blood pressure was significantly improved.  She was still grieving the death of her daughter who unfortunately had died with stage IV lung cancer.  I last saw her on December 22, 2020.  At that time she had remained stable.   She continues to walk with a cane.  She denies any chest pain, significant shortness of breath or palpitations.  She quit tobacco several years ago.  She underwent colonoscopy in February 2022 and was found to have 3 polyps which were removed.  She has not had recent laboratory.  Subsequent lipid studies on January 11, 2021 showed total cholesterol 160, triglycerides 211, HDL 45, LDL 80.  I have not seen her in almost 3 years.  She had developed atrial fibrillation.  Conversion pauses and ultimately was evaluated by Dr. Elberta Fortis.  She successfully underwent atrial fibrillation ablation on December 07, 2022.  Last saw him for office follow-up on April 25, 2023.  At that time he was still on amiodarone this was discontinued.  She continues to be on Eliquis for secondary hypercoagulable state.  She is not having any chest pain.  Her blood pressure was controlled.  She was scheduled to undergo A-fib clinic.  Presently, Alexis Kline has felt well.  She continues to walk with a cane.  She denies any chest pain.  There is no shortness of breath.  At times she does experience episodic palpitations which may last for 6 to 10 seconds.  She denies any presyncope or syncope or recent bleeding.  At times she admits to occasional ankle swelling.  She currently is on amlodipine 5 mg, carvedilol 12.5 mg twice a day in addition to Eliquis 5 mg twice a day.  She is on atorvastatin 40 mg daily for hyperlipidemia.  She has not had recent lipid studies.  Her previous GI doctor Dr. Christella Hartigan has retired.  She presents for cardiology evaluation.  Past Medical History:  Diagnosis Date   Acute pain of left shoulder 02/22/2017   APPENDECTOMY, HX OF 07/10/2006   Qualifier: Diagnosis of  By: Danae Chen      Arthritis    back, hands, legs , feet    CAD (coronary artery disease) 2007   nonobstructive, 30% LAD 02/2006   COPD (chronic obstructive pulmonary disease) (HCC)    Diarrhea 04/12/2019   Fever 04/10/2020   Fibroadenoma 2008   right breast - based on [place] 09/2006   GERD (gastroesophageal reflux disease)    meds helps    Hemorrhoids    HLD (hyperlipidemia)    Hypertension    HYSTERECTOMY, HX OF 07/10/2006   Qualifier: Diagnosis of  By: Landis Martins DO, Elizabeth     Left flank pain 07/04/2019   Myocardial infarct, old    Pleuritic chest pain 01/01/2014   01/01/14: 4-5 month history of "clawing" sensation in her right lung that has worsened over the last month without an obvious trigger 01/02/14: CTA negative  for PE   Stable angina (HCC)    chronic    Stroke (HCC)    many years ago ~ 30 yrs per pt     Past Surgical History:  Procedure Laterality Date   ABDOMINAL HYSTERECTOMY  1988   APPENDECTOMY  1966   ATRIAL FIBRILLATION ABLATION N/A 12/07/2022   Procedure: ATRIAL FIBRILLATION ABLATION;  Surgeon: Regan Lemming, MD;  Location: [facility];  Service: Cardiovascular;  Laterality: N/A;   BLADDER SUSPENSION  1997   BREAST EXCISIONAL BIOPSY Right 1988   BREAST LUMPECTOMY Left    right breat   CARPAL TUNNEL RELEASE  1991, 1993   3 operations in right hand and 2 operations in left hand. Dr. Andrey Campanile in [place]   COLONOSCOPY  02/04/2015   Rob Bunting   COLONOSCOPY  08/17/2020   DJ   POLYPECTOMY     PTCA  2007   rectal stapling  2008   Done by Dr. Carolynne Edouard   RECTAL SURGERY  01/07/2007   Dr. Carolynne Edouard in [place].    SHOULDER SURGERY Bilateral 1991   Right shoulder surgery in 1991 and left shoulder surgery in 1992 by Dr. Andrey Campanile in [place].    Current Medications: Outpatient Medications Prior to Visit  Medication Sig Dispense Refill   amLODipine (NORVASC) 5 MG tablet Take 1 tablet (5 mg total) by mouth daily. 90 tablet 1   apixaban (ELIQUIS) 5 MG TABS tablet TAKE ONE TABLET BY  MOUTH TWICE DAILY 60 tablet 5   aspirin EC 81 MG tablet Take 81 mg by mouth every evening. Swallow whole.     Aspirin-Caffeine (BAYER BACK & BODY PAIN EX ST) 500-32.5 MG TABS Take by mouth. Taking 1-2 tablets by mouth as needed     atorvastatin (LIPITOR) 40 MG tablet TAKE ONE TABLET BY MOUTH DAILY 90 tablet 3   Calcium Carbonate-Vit D-Min (CALTRATE 600+D PLUS MINERALS) 600-800 MG-UNIT CHEW Chew 1 tablet by mouth in the morning and at bedtime.     carvedilol (COREG) 25 MG tablet Take 1/2 tablet (12.5 mg total) by mouth 2 (two) times daily with a meal. 180 tablet 0   Coenzyme Q10-Vitamin E (QUNOL ULTRA COQ10 PO) Take 1 capsule by mouth in the morning.     cyanocobalamin (VITAMIN B12) 1000 MCG tablet Take 1,000 mcg by mouth in the morning.     naproxen sodium (ALEVE) 220 MG tablet Taking 1-2 tablets by mouth as needed     omeprazole (PRILOSEC) 40 MG capsule Take 1 capsule (40 mg total) by mouth 2 (two) times daily. Please schedule follow up with PCP to discuss this medication. 180 capsule 0   Polyethyl Glycol-Propyl Glycol (LUBRICANT EYE DROPS) 0.4-0.3 % SOLN Place 1-2 drops into both eyes daily as needed (dry/irritated eyes.).     Psyllium (METAMUCIL PO) Take 3 tablets by mouth once a week. Metamucil Fiber Gummy     senna-docusate (SENNALAX-S) 8.6-50 MG tablet Take 2 tablets by mouth 2 (two) times daily. (Patient taking differently: Take 1 tablet by mouth at bedtime.) 120 tablet 2   Coenzyme Q10 (CO Q-10 PO) Take 1 capsule by mouth every morning.     No facility-administered medications prior to visit.     Allergies:   Arlice Colt isothiocyanate], Iodine, Other, and Tape   Social History   Socioeconomic History   Marital status: Widowed    Spouse name: Not on file   Number of children: 2   Years of education: Not on file   Highest  education level: Not on file  Occupational History   Occupation: Retired  Tobacco Use   Smoking status: Former    Current packs/day: 0.00    Types:  Cigarettes    Start date: 06/21/1959    Quit date: 06/20/2013    Years since quitting: 10.1   Smokeless tobacco: Never  Vaping Use   Vaping status: Never Used  Substance and Sexual Activity   Alcohol use: Not Currently    Comment: rarely "maybe a drink of New Year's"   Drug use: No   Sexual activity: Not Currently  Other Topics Concern   Not on file  Social History Narrative   One child living, patient lives alone.   Social Drivers of Corporate investment banker Strain: Low Risk  (04/19/2023)   Overall Financial Resource Strain (CARDIA)    Difficulty of Paying Living Expenses: Not hard at all  Food Insecurity: No Food Insecurity (04/19/2023)   Hunger Vital Sign    Worried About Running Out of Food in the Last Year: Never true    Ran Out of Food in the Last Year: Never true  Transportation Needs: Unmet Transportation Needs (04/19/2023)   PRAPARE - Administrator, Civil Service (Medical): Yes    Lack of Transportation (Non-Medical): Yes  Physical Activity: Insufficiently Active (04/19/2023)   Exercise Vital Sign    Days of Exercise per Week: 3 days    Minutes of Exercise per Session: 20 min  Stress: No Stress Concern Present (04/19/2023)   [employer] of Occupational Health - Occupational Stress Questionnaire    Feeling of Stress : Not at all  Social Connections: Socially Isolated (04/19/2023)   Social Connection and Isolation Panel [NHANES]    Frequency of Communication with Friends and Family: Never    Frequency of Social Gatherings with Friends and Family: Never    Attends Religious Services: Never    Database administrator or Organizations: No    Attends Banker Meetings: Never    Marital Status: Widowed    Additional social history is notable in that she is a former Naval architect.  She is widowed for > 70 years.  She had 2 children, 4 grandchildren, and 11 great grandchildren.  She lives by herself.  She has 2 years of college.  She retired in  1970.  She previously had smoked for 55 years.  She does walk approximately 3-4 days per week.  Family History:  The patient's family history includes Diabetes in her sister; Heart disease in her sister; Hyperlipidemia in her father and mother; Hypertension in her father and mother; Leukemia in her brother; Stomach cancer in her sister.   ROS General: Negative; No fevers, chills, or night sweats;  HEENT: Negative; No changes in vision or hearing, sinus congestion, difficulty swallowing Pulmonary: Negative; No cough, wheezing, shortness of breath, hemoptysis Cardiovascular: see HPI GI: Positive for GERD GU: Negative; No dysuria, hematuria, or difficulty voiding Musculoskeletal: Chronic neck and arm pain since her remote trauma. Hematologic/Oncology: Negative; no easy bruising, bleeding Endocrine: Negative; no heat/cold intolerance; no diabetes Neuro: Negative; no changes in balance, headaches Skin: Negative; No rashes or skin lesions Psychiatric: Negative; No behavioral problems, depression Sleep: Negative; No snoring, daytime sleepiness, hypersomnolence, bruxism, restless legs, hypnogognic hallucinations, no cataplexy Other comprehensive 14 point system review is negative.   PHYSICAL EXAM:   VS:  BP 110/64 (BP Location: Left Arm, Patient Position: Sitting, Cuff Size: Normal)   Pulse 66   Ht 5\' 2"  (  1.575 m)   Wt 171 lb 9.6 oz (77.8 kg)   LMP 06/20/1986   SpO2 97%   BMI 31.39 kg/m     Repeat blood pressure by me was 108/64  Wt Readings from Last 3 Encounters:  08/16/23 171 lb 9.6 oz (77.8 kg)  04/25/23 165 lb (74.8 kg)  04/19/23 160 lb (72.6 kg)     General: Alert, oriented, no distress.  Skin: normal turgor, no rashes, warm and dry HEENT: Normocephalic, atraumatic. Pupils equal round and reactive to light; sclera anicteric; extraocular muscles intact;  Nose without nasal septal hypertrophy Mouth/Parynx benign; Mallinpatti scale 3 Neck: No JVD, no carotid bruits; normal  carotid upstroke Lungs: clear to ausculatation and percussion; no wheezing or rales Chest wall: without tenderness to palpitation Heart: PMI not displaced, RRR, s1 s2 normal, 1/6 systolic murmur, no diastolic murmur, no rubs, gallops, thrills, or heaves Abdomen: soft, nontender; no hepatosplenomehaly, BS+; abdominal aorta nontender and not dilated by palpation. Back: no CVA tenderness Pulses 2+ Musculoskeletal: full range of motion, normal strength, no joint deformities Extremities: no clubbing cyanosis or edema, Homan's sign negative  Neurologic: grossly nonfocal; Cranial nerves grossly wnl Psychologic: Normal mood and affect    Studies/Labs Reviewed:    EKG Interpretation Date/Time:  Wednesday August 16 2023 14:12:11 EST Ventricular Rate:  66 PR Interval:  172 QRS Duration:  92 QT Interval:  428 QTC Calculation: 448 R Axis:   -11  Text Interpretation: Normal sinus rhythm Anterolateral infarct (cited on or before 27-Dec-2022) When compared with ECG of 25-Apr-2023 11:45, Questionable change in initial forces of Lateral leads T wave inversion now evident in Lateral leads Confirmed by Nicki Guadalajara ([id number]) on 08/16/2023 3:35:38 PM    December 22, 2020 ECG (independently read by me): Sinus bradycardia 56 bpm.  Poor anterior R wave progression V1 through V4.  Normal intervals.  No ectopy.  July 08, 2018 ECG (independently read by me): Normal sinus rhythm at 61 bpm.  Poor anterior R wave progression.  September 2019 ECG (independently read by me): Normal sinus rhythm at 62 bpm.  Poor anterior R wave progression.  Normal intervals.  No ectopy.  May 17, 2017 ECG (independently read by me): Normal sinus rhythm at 74 bpm.  QS complex.  Anteroseptally  11/22/2016 ECG (independently read by me): Normal sinus rhythm at 79 bpm.  No ectopy.  Normal intervals.  No significant ST changes.  10/26/2016 ECG (independently read by me): Normal sinus rhythm at 65 bpm.  Normal intervals.  No  ectopy.  No significant ST-T changes.  Recent Labs:    Latest Ref Rng & Units 11/22/2022   10:00 AM 05/16/2022    4:09 PM 08/10/2021    3:17 PM  BMP  Glucose 70 - 99 mg/dL 93  98  74   BUN 8 - 27 mg/dL 10  12  18    Creatinine 0.57 - 1.00 mg/dL 1.91  4.78  2.95   BUN/Creat Ratio 12 - 28 13  16  24    Sodium 134 - 144 mmol/L 143  145  141   Potassium 3.5 - 5.2 mmol/L 4.1  4.4  4.5   Chloride 96 - 106 mmol/L 106  109  103   CO2 20 - 29 mmol/L 25  24  23    Calcium 8.7 - 10.3 mg/dL 9.9  9.9  62.1         Latest Ref Rng & Units 08/10/2021    3:17 PM 07/09/2019    9:41 AM 07/13/2018  9:20 AM  Hepatic Function  Total Protein 6.0 - 8.5 g/dL 7.3  6.8  6.5   Albumin 3.7 - 4.7 g/dL 4.3  4.3  4.1   AST 0 - 40 IU/L 30  27  45   ALT 0 - 32 IU/L 19  16  32   Alk Phosphatase 44 - 121 IU/L 68  56  56   Total Bilirubin 0.0 - 1.2 mg/dL 0.4  0.3  0.5   Bilirubin, Direct 0.00 - 0.40 mg/dL   6.96        Latest Ref Rng & Units 11/22/2022   10:00 AM 05/16/2022    4:09 PM 08/10/2021    3:17 PM  CBC  WBC 3.4 - 10.8 x10E3/uL 6.2  6.3  8.3   Hemoglobin 11.1 - 15.9 g/dL 29.5  28.4  13.2   Hematocrit 34.0 - 46.6 % 38.7  38.7  42.1   Platelets 150 - 450 x10E3/uL 277  236  291    Lab Results  Component Value Date   MCV 80 11/22/2022   MCV 82 05/16/2022   MCV 85 08/10/2021   Lab Results  Component Value Date   TSH 1.770 02/01/2023   No results found for: "HGBA1C"   BNP No results found for: "BNP"  ProBNP No results found for: "PROBNP"   Lipid Panel     Component Value Date/Time   CHOL 160 01/11/2021 1410   TRIG 211 (H) 01/11/2021 1410   HDL 45 01/11/2021 1410   CHOLHDL 3.6 01/11/2021 1410   CHOLHDL 3.8 01/01/2014 1433   VLDL 38 01/01/2014 1433   LDLCALC 80 01/11/2021 1410     RADIOLOGY: No results found.   Additional studies/ records that were reviewed today include:  I reviewed the patient's prior cardiac catheterization from 11/03/2010.  I reviewed recent office records.    I reviewed her nuclear perfusion study, as well as her echo Doppler study and  Laboratory.  ECHO 10/20/2016 Study Conclusions   - Left ventricle: The cavity size was normal. Systolic function was   vigorous. The estimated ejection fraction was in the range of 65%   to 70%. Wall motion was normal; there were no regional wall   motion abnormalities. Doppler parameters are consistent with   abnormal left ventricular relaxation (grade 1 diastolic   dysfunction). - Mitral valve: Calcified annulus  ASSESSMENT:    1. Essential hypertension   2. 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 Status post ablation of atrial fibrillation   4. Secondary hypercoagulable state (HCC)   5. Mixed hyperlipidemia   6. CAD in native artery: mild 2021   7. Prior history of tobacco abuse for 55 years     PLAN:  Alexis Kline is a 78 year-old female has a history of significant tobacco abuse, having smoked for 55 years and fortunately quit smoking several years ago.  She has a greater than 30 year history of hypertension.  She was found to have mild nonobstructive CAD at cardiac catheterization in May 2012.  She has a history of hyperlipidemia.  She does not have any history of anginal symptomatology.  A  nuclear perfusion study in May 2018 showed normal perfusion with hyperdynamic ejection fraction and normal wall motion.  At a previous evaluation she was significantly hypertensive and amlodipine was added to her regimen.  She has a history of mild hyperlipidemia for which she has been on atorvastatin.  Her last lipid studies in July 2022 total cholesterol 160  triglycerides 211 LDL cholesterol 80.  She had developed episodes of atrial fibrillation and ultimately underwent atrial fibrillation ablation by Dr. Elberta Fortis on December 07, 2022.  On subsequent reevaluation in November 2024 she was maintaining sinus rhythm and amiodarone was discontinued.  She has continued to be on Eliquis for send  anticoagulation.  Presently she is not having anginal symptomatology or shortness of breath.  She does note occasional palpitations intermittently which may last 10 seconds.  Currently she is on a medical regimen of amlodipine 5 mg, carvedilol 12.5 mg twice a day, Eliquis 5 mg twice a day.  She has a history of GERD.  She has undergone several colonoscopies by Dr. Christella Hartigan who has recently retired.  She lives by herself.  She often sleeps in the recliner.  Presently, I am recommending she undergo a follow-up echo Doppler study for reassessment of LV systolic and diastolic function and valvular architecture.  If she continues to experience periodic increased palpitations I have suggested she can take an extra 12.5 mg carvedilol as needed.  Her blood pressure today is on the low side and I will not titrate her dose to 25 twice daily presently.  I am scheduling her for follow-up laboratory with a comprehensive metabolic panel, CBC, TSH and lipid studies.  She also will be having A-fib follow-up clinic.  I will schedule her to see Anice Paganini, NP in 3 to 4 months for reevaluation.  I discussed with her my plans for retirement later this year.  She will then be transitioned to a new cardiology primary provider.    Medication Adjustments/Labs and Tests Ordered: Current medicines are reviewed at length with the patient today.  Concerns regarding medicines are outlined above.  Medication changes, Labs and Tests ordered today are listed in the Patient Instructions below. Patient Instructions  Medication Instructions:  Continue to check your blood pressure.   You may take an additional half of the Carvedilol 25mg  if you are experiencing palpitations.   *If you need a refill on your cardiac medications before your next appointment, please call your pharmacy*   Lab Work: Return for fasting labs If you have labs (blood work) drawn today and your tests are completely normal, you will receive your results only  by: MyChart Message (if you have MyChart) OR A paper copy in the mail If you have any lab test that is abnormal or we need to change your treatment, we will call you to review the results.   Testing/Procedures: Your physician has requested that you have an echocardiogram. Echocardiography is a painless test that uses sound waves to create images of your heart. It provides your doctor with information about the size and shape of your heart and how well your heart's chambers and valves are working. This procedure takes approximately one hour. There are no restrictions for this procedure. Please do NOT wear cologne, perfume, aftershave, or lotions (deodorant is allowed). Please arrive 15 minutes prior to your appointment time.  Please note: We ask at that you not bring children with you during ultrasound (echo/ vascular) testing. Due to room size and safety concerns, children are not allowed in the ultrasound rooms during exams. Our front office staff cannot provide observation of children in our lobby area while testing is being conducted. An adult accompanying a patient to their appointment will only be allowed in the ultrasound room at the discretion of the ultrasound technician under special circumstances. We apologize for any inconvenience.    Follow-Up: At [facility], you and your health needs are our priority.  As part of our continuing mission to provide you with exceptional heart care, we have created designated Provider Care Teams.  These Care Teams include your primary Cardiologist (physician) and Advanced Practice Providers (APPs -  Physician Assistants and Nurse Practitioners) who all work together to provide you with the care you need, when you need it.  We recommend signing up for the patient portal called "MyChart".  Sign up information is provided on this After Visit Summary.  MyChart is used to connect with patients for Virtual Visits (Telemedicine).  Patients are able to  view lab/test results, encounter notes, upcoming appointments, etc.  Non-urgent messages can be sent to your provider as well.   To learn more about what you can do with MyChart, go to [email or web address].    Your next appointment:   3 month(s)  Provider:   Bernadene Person, NP        Other Instructions        1st Floor: - Lobby - Registration  - Pharmacy  - Lab - Cafe   2nd Floor: - PV Lab - Diagnostic Testing (echo, CT, nuclear med)   3rd Floor: - Vacant   4th Floor: - TCTS (cardiothoracic surgery) - AFib Clinic - Structural Heart Clinic - Vascular Surgery  - Vascular Ultrasound   5th Floor: - HeartCare Cardiology (general and EP) - Clinical Pharmacy for coumadin, hypertension, lipid, weight-loss medications, and med management appointments      Valet parking services will be available as well.            Signed, Nicki Guadalajara, MD  08/16/2023 3:42 PM    [facility] [facility] [address], [place], [place]  [address] Phone: [phone]

## 2023-08-22 ENCOUNTER — Other Ambulatory Visit: Payer: Self-pay | Admitting: Internal Medicine

## 2023-08-22 ENCOUNTER — Other Ambulatory Visit: Payer: Self-pay | Admitting: Cardiology

## 2023-08-22 DIAGNOSIS — I495 Sick sinus syndrome: Secondary | ICD-10-CM

## 2023-08-22 NOTE — Telephone Encounter (Signed)
 Eliquis 5mg  refill request received. Patient is 78 years old, weight-77.8kg, Crea-0.78 on 11/22/22, Diagnosis-Afib, and last seen by Dr. Tresa Endo on 08/16/23. Dose is appropriate based on dosing criteria. Will send in refill to requested pharmacy.

## 2023-08-24 ENCOUNTER — Other Ambulatory Visit: Payer: Self-pay | Admitting: General Practice

## 2023-08-24 DIAGNOSIS — E782 Mixed hyperlipidemia: Secondary | ICD-10-CM | POA: Diagnosis not present

## 2023-08-24 DIAGNOSIS — I10 Essential (primary) hypertension: Secondary | ICD-10-CM | POA: Diagnosis not present

## 2023-08-25 LAB — COMPREHENSIVE METABOLIC PANEL
ALT: 20 IU/L (ref 0–32)
AST: 30 IU/L (ref 0–40)
Albumin: 4.4 g/dL (ref 3.8–4.8)
Alkaline Phosphatase: 75 IU/L (ref 44–121)
BUN/Creatinine Ratio: 20 (ref 12–28)
BUN: 16 mg/dL (ref 8–27)
Bilirubin Total: 0.3 mg/dL (ref 0.0–1.2)
CO2: 25 mmol/L (ref 20–29)
Calcium: 10.2 mg/dL (ref 8.7–10.3)
Chloride: 105 mmol/L (ref 96–106)
Creatinine, Ser: 0.81 mg/dL (ref 0.57–1.00)
Globulin, Total: 2.7 g/dL (ref 1.5–4.5)
Glucose: 83 mg/dL (ref 70–99)
Potassium: 4.7 mmol/L (ref 3.5–5.2)
Sodium: 142 mmol/L (ref 134–144)
Total Protein: 7.1 g/dL (ref 6.0–8.5)
eGFR: 75 mL/min/{1.73_m2} (ref >59)

## 2023-08-25 LAB — LIPID PANEL
Chol/HDL Ratio: 2.9 ratio (ref 0.0–4.4)
Cholesterol, Total: 180 mg/dL (ref 100–199)
HDL: 62 mg/dL (ref >39)
LDL Chol Calc (NIH): 76 mg/dL (ref 0–99)
Triglycerides: 260 mg/dL — ABNORMAL HIGH (ref 0–149)
VLDL Cholesterol Cal: 42 mg/dL — ABNORMAL HIGH (ref 5–40)

## 2023-08-25 LAB — CBC
Hematocrit: 37.7 % (ref 34.0–46.6)
Hemoglobin: 11.3 g/dL (ref 11.1–15.9)
MCH: 23.5 pg — ABNORMAL LOW (ref 26.6–33.0)
MCHC: 30 g/dL — ABNORMAL LOW (ref 31.5–35.7)
MCV: 79 fL (ref 79–97)
Platelets: 295 10*3/uL (ref 150–450)
RBC: 4.8 x10E6/uL (ref 3.77–5.28)
RDW: 15.8 % — ABNORMAL HIGH (ref 11.7–15.4)
WBC: 6.6 10*3/uL (ref 3.4–10.8)

## 2023-08-25 LAB — TSH: TSH: 2.11 u[IU]/mL (ref 0.450–4.500)

## 2023-08-28 ENCOUNTER — Other Ambulatory Visit: Payer: Self-pay

## 2023-08-29 MED ORDER — OMEPRAZOLE 40 MG PO CPDR: 40.0000 mg | DELAYED_RELEASE_CAPSULE | Freq: Two times a day (BID) | ORAL | 0 refills | Status: DC

## 2023-09-05 ENCOUNTER — Ambulatory Visit: Payer: 59 | Admitting: Adult Health

## 2023-09-06 ENCOUNTER — Telehealth (HOSPITAL_COMMUNITY): Payer: Self-pay | Admitting: Cardiovascular Disease

## 2023-09-06 NOTE — Telephone Encounter (Signed)
 Patient called and cancelled echocardiogram and will call back to reschedule later. Order will be removed from the echo WQ. Thank you.

## 2023-09-06 NOTE — Telephone Encounter (Signed)
 Acknowledged.

## 2023-09-07 ENCOUNTER — Ambulatory Visit (HOSPITAL_COMMUNITY): Payer: 59

## 2023-09-07 ENCOUNTER — Encounter (HOSPITAL_COMMUNITY): Payer: Self-pay

## 2023-09-20 ENCOUNTER — Encounter: Payer: Self-pay | Admitting: Internal Medicine

## 2023-09-20 ENCOUNTER — Telehealth: Payer: Self-pay

## 2023-09-20 ENCOUNTER — Ambulatory Visit: Payer: 59 | Admitting: Internal Medicine

## 2023-09-20 VITALS — BP 124/82 | HR 70 | Ht 62.0 in | Wt 171.4 lb

## 2023-09-20 DIAGNOSIS — Z7901 Long term (current) use of anticoagulants: Secondary | ICD-10-CM

## 2023-09-20 DIAGNOSIS — Z8679 Personal history of other diseases of the circulatory system: Secondary | ICD-10-CM

## 2023-09-20 DIAGNOSIS — R140 Abdominal distension (gaseous): Secondary | ICD-10-CM

## 2023-09-20 DIAGNOSIS — Z860101 Personal history of adenomatous and serrated colon polyps: Secondary | ICD-10-CM

## 2023-09-20 DIAGNOSIS — Z860100 Personal history of colon polyps, unspecified: Secondary | ICD-10-CM

## 2023-09-20 MED ORDER — SUTAB 1479-225-188 MG PO TABS: 24.0000 | ORAL_TABLET | ORAL | 0 refills | Status: AC

## 2023-09-20 MED ORDER — ONDANSETRON HCL 4 MG PO TABS: ORAL_TABLET | ORAL | 0 refills | Status: AC

## 2023-09-20 NOTE — Telephone Encounter (Signed)
 [facility] Pre-operative Risk Assessment     Request for surgical clearance:     Endoscopy Procedure  What type of surgery is being performed?     colonoscopy  When is this surgery scheduled?     11/16/23  What type of clearance is required ?   Pharmacy  Are there any medications that need to be held prior to surgery and how long? Xarelto x 2 days  Practice name and name of physician performing surgery?      [facility]  What is your office phone and fax number?      Phone- [phone]  Fax- [phone]  Anesthesia type (None, local, MAC, general) ?       MAC   Please route your response to Jovita Kussmaul, CMA

## 2023-09-20 NOTE — Progress Notes (Signed)
 Subjective:    Patient ID: Alexis Kline, female    DOB: January 15, 1946, 78 y.o.   MRN: [id number]  Alexis Kline is a 78 year old female who presents for surveillance colonoscopy and to discuss ongoing abdominal bloating.  Her last colonoscopy in February 2022 revealed three subcentimeter tubular adenomas and left-sided diverticulosis. A three-year recall colonoscopy was recommended at that time.  She experiences significant bloating, describing it as 'looking like I'm pregnant all the time.' The bloating is present daily, causing discomfort and difficulty bending over and breathing. It is not associated with pain and is not necessarily related to food intake, as even a small amount of food like a piece of toast can trigger it. The bloating is typically less pronounced upon waking.  She has regular bowel movements and is currently taking an over-the-counter medication to aid this, although she cannot recall the name. She previously used Miralax and Dulcolax but discontinued them due to increased bloating. The current medication allows her to have bowel movements most days.  Her past medical history includes atrial fibrillation, for which she underwent an ablation in June 2024. She remains on Eliquis for this condition. She also has a history of GERD, COPD, hypertension, and nonobstructive CAD.   Review of Systems As per Alexis, otherwise negative  Current Medications, Allergies, Past Medical History, Past Surgical History, Family History and Social History were reviewed in [employer] record.    Objective:   Physical Exam BP 124/82   Pulse 70   Ht 5\' 2"  (1.575 m)   Wt 171 lb 6 oz (77.7 kg)   LMP 06/20/1986   BMI 31.34 kg/m  Gen: awake, alert, NAD HEENT: anicteric  CV: RRR, no mrg Pulm: CTA b/l Abd: soft, abdominal distention without tenderness, +BS throughout Ext: no c/c/e Neuro: nonfocal   Colonoscopy 08/17/2020 by Dr. Christella Hartigan: - Three 2 to 4  mm polyps in the sigmoid colon, in the transverse colon and in the cecum, removed with a cold snare. Resected and retrieved. - Diverticulosis in the left colon. - The examination was otherwise normal on direct and retroflexion views. - 3 year recall urgical [P], colon, sigmoid, transverse and ascending, polyp (3) - TUBULAR ADENOMA (X3). - NEGATIVE FOR HIGH GRADE DYSPLASIA     Assessment & Plan:   78 year old female who presents for surveillance colonoscopy and to discuss ongoing abdominal bloating.  Bloating Chronic bloating query SIBO. Breath test can confirm diagnosis and guide treatment. - Order SIBO breath test for home administration. -Can continue over-the-counter Gas-X which she finds helpful  Surveillance Colonoscopy due to history of adenomatous colon polyps Surveillance colonoscopy indicated given history of adenomatous polyps. Recommended every three years until approximately age 45 and as long as the benefits of early detection outweigh risks. Eliquis to be held 48 hours prior. - Schedule colonoscopy - Use Sutab tablets for bowel preparation. - Coordinate with cardiologist to hold Eliquis for 48 hours pre-procedure. - Administer Zofran for potential nausea from bowel prep.  History of atrial fibrillation on Eliquis Will hold Eliquis 2 days prior to endoscopic procedures - will instruct when and how to resume after procedure. Benefits and risks of procedure explained including risks of bleeding, perforation, infection, missed lesions, reactions to medications and possible need for hospitalization and surgery for complications. Additional rare but real risk of stroke or other vascular clotting events off Eliquis also explained and need to seek urgent help if any signs of these problems occur. Will  communicate by phone or EMR with patient's  prescribing provider to confirm that holding Eliquis is reasonable in this case.   30 minutes total spent today including patient facing  time, coordination of care, reviewing medical history/procedures/pertinent radiology studies, and documentation of the encounter.

## 2023-09-20 NOTE — Patient Instructions (Signed)
 We have sent the following medications to your pharmacy for you to pick up at your convenience: Zofran.  You have been given a testing kit to check for small intestine bacterial overgrowth (SIBO) which is completed by a company named [facility]. Make sure to return your test in the mail using the return mailing label given to you along with the kit. The test order, your demographic and insurance information have all already been sent to the company. [facility] will collect an upfront charge of $99.74 for commercial insurance plans and $209.74 if you are paying cash. Make sure to discuss with [facility] PRIOR to having the test to see if they have gotten information from your insurance company as to how much your testing will cost out of pocket, if any. Please contact [facility] at phone number [phone] to get instructions regarding how to perform the test as our office is unable to give specific testing instructions.  You have been scheduled for a colonoscopy. Please follow written instructions given to you at your visit today.   If you use inhalers (even only as needed), please bring them with you on the day of your procedure.  DO NOT TAKE 7 DAYS PRIOR TO TEST- Trulicity (dulaglutide) Ozempic, Wegovy (semaglutide) Mounjaro (tirzepatide) Bydureon Bcise (exanatide extended release)  DO NOT TAKE 1 DAY PRIOR TO YOUR TEST Rybelsus (semaglutide) Adlyxin (lixisenatide) Victoza (liraglutide) Byetta (exanatide) ___________________________________________________________________________  Bonita Quin will receive your bowel preparation through [facility], which ensures the lowest copay and home delivery, with outreach via text or call from an [phone] number. Please respond promptly to avoid rescheduling of your procedure. If you are interested in alternative options or have any questions regarding your prep, please contact them at  [phone] ____________________________________________________________________________  Your Provider Has Sent Your Bowel Prep Regimen To [facility]   [facility] will contact you to verify your information and collect your copay, if applicable. Enjoy the comfort of your home while your prescription is mailed to you, FREE of any shipping charges.   [facility] accepts all major insurance benefits and applies discounts & coupons.  Have additional questions?   Chat: [email or web address] Call: [phone] Email: [email or web address] NCPDP: [id number]  How will [facility] contact you?  With a Welcome phone call,  a Welcome text and a checkout link in text form.  Texts you receive from [phone] Are NOT Spam.  *To set up delivery, you must complete the checkout process via link or speak to one of the patient care representatives. If [facility] is unable to reach you, your prescription may be delayed.  To avoid long hold times on the phone, you may also utilize the secure chat feature on the [facility] website to request that they call you back for transaction completion or to expedite your concerns.  _______________________________________________________  If your blood pressure at your visit was 140/90 or greater, please contact your primary care physician to follow up on this.  _______________________________________________________  If you are age 58 or older, your body mass index should be between 23-30. Your Body mass index is 31.34 kg/m. If this is out of the aforementioned range listed, please consider follow up with your Primary Care Provider.  If you are age 53 or younger, your body mass index should be between 19-25. Your Body mass index is 31.34 kg/m. If this is out of the aformentioned range listed, please consider follow up with your Primary Care Provider.   ________________________________________________________  The [facility] providers would like to  encourage you to use [facility] to communicate  with providers for non-urgent requests or questions.  Due to long hold times on the telephone, sending your provider a message by [facility] may be a faster and more efficient way to get a response.  Please allow 48 business hours for a response.  Please remember that this is for non-urgent requests.  _______________________________________________________

## 2023-09-21 NOTE — Telephone Encounter (Signed)
 Patient with diagnosis of atrial fibrillation on Eliquis for anticoagulation.    What type of surgery is being performed?     colonoscopy  When is this surgery scheduled?     11/16/23    CHA2DS2-VASc Score = 5   This indicates a 7.2% annual risk of stroke. The patient's score is based upon: CHF History: 0 HTN History: 1 Diabetes History: 0 Stroke History: 0 Vascular Disease History: 1 Age Score: 2 Gender Score: 1    CrCl 71 Platelet count 295  Per office protocol, patient can hold Xarelto for 2 days prior to procedure.   Patient will not need bridging with Lovenox (enoxaparin) around procedure.  **This guidance is not considered finalized until pre-operative APP has relayed final recommendations.**

## 2023-09-25 ENCOUNTER — Telehealth: Payer: Self-pay | Admitting: *Deleted

## 2023-09-25 NOTE — Telephone Encounter (Signed)
 Copied from CRM [id number]. Topic: Clinical - Home Health Verbal Orders >> Sep 22, 2023  4:27 PM Irine Seal wrote: Caller/Agency: [facility] care navigator calling on behalf of the patient  Callback Number: [phone] (care navigator said to reach out to patient)  Service Requested: home health aid   The agent did not give her name, she stated the patient has asked about it a year prior and never got a response so she included [employer] to try and get it set up for her she would like to discuss at her next appointment 09/29/2023

## 2023-09-27 ENCOUNTER — Ambulatory Visit
Admission: RE | Admit: 2023-09-27 | Discharge: 2023-09-27 | Disposition: A | Discharge status: Home / Self Care | Payer: 59 | Source: Ambulatory Visit

## 2023-09-27 ENCOUNTER — Encounter: Admitting: Internal Medicine

## 2023-09-27 DIAGNOSIS — Z1231 Encounter for screening mammogram for malignant neoplasm of breast: Secondary | ICD-10-CM | POA: Diagnosis not present

## 2023-09-27 NOTE — Telephone Encounter (Signed)
 Informed patient she can hold Xarelto 2 days prior to her procedure on 11/16/23. Patient verbalized understanding.

## 2023-09-28 NOTE — Telephone Encounter (Signed)
 Informed patient again she can hold xarelto on 11/14/23 and 11/15/23 prior to her procedure on 11/16/23. Patient verbalized understanding.

## 2023-09-28 NOTE — Telephone Encounter (Signed)
 Inbound call from patient requesting a call to go over the information provided over the phone yesterday. States she had a headache and heard some of what was discussed but not all. Please advise, thank you.

## 2023-09-29 ENCOUNTER — Other Ambulatory Visit: Payer: Self-pay

## 2023-09-29 ENCOUNTER — Emergency Department (HOSPITAL_COMMUNITY)
Admission: EM | Admit: 2023-09-29 | Discharge: 2023-09-29 | Discharge status: Against Medical Advice | Attending: Emergency Medicine | Admitting: Emergency Medicine

## 2023-09-29 ENCOUNTER — Encounter (HOSPITAL_COMMUNITY): Payer: Self-pay

## 2023-09-29 ENCOUNTER — Ambulatory Visit: Admitting: Internal Medicine

## 2023-09-29 VITALS — BP 139/79 | HR 71 | Temp 98.2°F | Wt 171.4 lb

## 2023-09-29 DIAGNOSIS — R519 Headache, unspecified: Secondary | ICD-10-CM

## 2023-09-29 DIAGNOSIS — I480 Paroxysmal atrial fibrillation: Secondary | ICD-10-CM | POA: Diagnosis not present

## 2023-09-29 DIAGNOSIS — I679 Cerebrovascular disease, unspecified: Secondary | ICD-10-CM

## 2023-09-29 DIAGNOSIS — Z742 Need for assistance at home and no other household member able to render care: Secondary | ICD-10-CM

## 2023-09-29 DIAGNOSIS — E785 Hyperlipidemia, unspecified: Secondary | ICD-10-CM | POA: Diagnosis not present

## 2023-09-29 DIAGNOSIS — Z5321 Procedure and treatment not carried out due to patient leaving prior to being seen by health care provider: Secondary | ICD-10-CM | POA: Insufficient documentation

## 2023-09-29 DIAGNOSIS — I10 Essential (primary) hypertension: Secondary | ICD-10-CM

## 2023-09-29 DIAGNOSIS — H53149 Visual discomfort, unspecified: Secondary | ICD-10-CM | POA: Insufficient documentation

## 2023-09-29 DIAGNOSIS — K219 Gastro-esophageal reflux disease without esophagitis: Secondary | ICD-10-CM | POA: Diagnosis not present

## 2023-09-29 LAB — CBC
HCT: 37.8 % (ref 36.0–46.0)
Hemoglobin: 11.1 g/dL — ABNORMAL LOW (ref 12.0–15.0)
MCH: 23.3 pg — ABNORMAL LOW (ref 26.0–34.0)
MCHC: 29.4 g/dL — ABNORMAL LOW (ref 30.0–36.0)
MCV: 79.2 fL — ABNORMAL LOW (ref 80.0–100.0)
Platelets: 280 10*3/uL (ref 150–400)
RBC: 4.77 MIL/uL (ref 3.87–5.11)
RDW: 16.7 % — ABNORMAL HIGH (ref 11.5–15.5)
WBC: 7.7 10*3/uL (ref 4.0–10.5)
nRBC: 0 % (ref 0.0–0.2)

## 2023-09-29 LAB — BASIC METABOLIC PANEL WITH GFR
Anion gap: 11 (ref 5–15)
BUN: 16 mg/dL (ref 8–23)
CO2: 23 mmol/L (ref 22–32)
Calcium: 9.7 mg/dL (ref 8.9–10.3)
Chloride: 105 mmol/L (ref 98–111)
Creatinine, Ser: 0.78 mg/dL (ref 0.44–1.00)
GFR, Estimated: >60 mL/min (ref >60)
Glucose, Bld: 104 mg/dL — ABNORMAL HIGH (ref 70–99)
Potassium: 3.9 mmol/L (ref 3.5–5.1)
Sodium: 139 mmol/L (ref 135–145)
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
## 2023-09-29 NOTE — ED Triage Notes (Addendum)
 Pt to ED from clinic. Pt has had headache w/photophobia x 8 days. Pt denies nausea or vomiting. Pt states she does not usually get headaches. Pt has had intermittent dizziness, denies at this time.
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
## 2023-09-29 NOTE — Assessment & Plan Note (Signed)
 Plan: Continue ASA 81 mg daily

## 2023-09-29 NOTE — Assessment & Plan Note (Signed)
 Plan: Continue prilosec 40 mg BID.

## 2023-09-29 NOTE — Assessment & Plan Note (Signed)
 Plan: Continue eliquis 5 mg BID

## 2023-09-29 NOTE — ED Notes (Signed)
 Called for repeat vitals x3 no answer

## 2023-09-29 NOTE — Assessment & Plan Note (Signed)
 OP regimen is atorvastatin 40 mg daily which she is compliant with. Lipid panel last checked 08/2023 with LDL 76.  Plan: Continue atorvastatin 40 mg daily.

## 2023-09-29 NOTE — Assessment & Plan Note (Signed)
 BP 139/79. OP regimen is amlodipine 5 mg daily, carvedilol 25 mg BID which she is compliant with and did not yet take this morning. Renal function WNL 08/2023. Plan:Will not adjust dose of anti-HTN medications at this time. Encouraged patient to take medications prior to next OV. Continue amlodipine 5 mg daily and carvedilol 25 mg BID.

## 2023-09-29 NOTE — Assessment & Plan Note (Signed)
 Plan: Will discuss with clinic staff to find out what needs to be done to help facilitate this for her.

## 2023-10-02 ENCOUNTER — Telehealth: Payer: Self-pay | Admitting: Internal Medicine

## 2023-10-02 NOTE — Telephone Encounter (Addendum)
 Return pt's call - stated she went to ED on 4/11; stated she could not sit in the waiting room d/t to the fluorescent lights which cause h/a's . Stated she has been having h/a's since the 3rd and no meds have helped. Pt stated she does not want to schedule an in-person  appt nor telehealth; stated she wants Dr Rozelle Corning to call her.

## 2023-10-02 NOTE — Telephone Encounter (Signed)
 Copied from CRM [id number]. Topic: General - Other >> Sep 29, 2023  4:13 PM Brynn Caras wrote: Reason for CRM: The patient wanted to confirm if Dr.Dean is in office, advised that the office closes at 12:00 on Fridays. The patient is requesting a callback from her PCP on Monday for an unspecified reason.  Callback [phone]  Please refer to message above.

## 2023-10-02 NOTE — Telephone Encounter (Signed)
 Need an order for Referral to [facility] and the reason why.

## 2023-10-02 NOTE — Progress Notes (Signed)
 Internal Medicine Clinic Attending  I was physically present during the key portions of the resident provided service and participated in the medical decision making of patient's management care. I reviewed pertinent patient test results.  The assessment, diagnosis, and plan were formulated together and I agree with the documentation in the resident's note.  Driscilla George, MD    Patient here with an acute severe headache. She has a history of possible brain lesion that was supposed to get worked up with MRI earlier this year, she is on anticoagulation, and she has a history of a stroke. I counseled patient on the importance of getting urgent brain imaging, and I agree with Dr Eliberto Grosser plan to send her directly to the ER for brain imaging.

## 2023-10-03 ENCOUNTER — Telehealth: Payer: Self-pay | Admitting: Internal Medicine

## 2023-10-03 NOTE — Telephone Encounter (Signed)
 Order has placed per doctor.

## 2023-10-03 NOTE — Addendum Note (Signed)
 Addended by: Maple Seltzer on: 10/03/2023 03:17 PM   Modules accepted: Orders

## 2023-10-03 NOTE — Telephone Encounter (Signed)
 Spoke with patient to follow up on recommendation 04/11 to undergo further testing and imaging in the emergency department for her severe headache with several "red flag" symptoms. She states that the lights in the waiting room made her headache worse which is why she did not stay for further evaluation. She continues to have this headache, and I have reiterated my recommendation that she go to the emergency department for further work-up and imaging. I advised that she take sunglasses with her to help with the lights. She seems amenable to this plan.

## 2023-10-22 NOTE — Progress Notes (Unsigned)
 Cardiology Office Note:  .   Date:  10/22/2023  ID:  Alexis Kline, DOB 1945/06/26, MRN [id number] PCP: Malen Scudder, DO  [facility] Providers Cardiologist:  Magnus Schuller, MD Electrophysiologist:  Lei Pump, MD {  History of Present Illness: .   Alexis Kline is a 78 y.o. 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 Lawana Pray 04/25/23, low AF burden, amiodarone  stopped  She saw Dr. Loetta Ringer 08/16/23, fleeting palpitations only, discussed PRN dose of her coreg  for palpitations, planned to update her echo Also discussed mention of having hx of heart attack reported by the patient, no data  09/29/23 ER visit with persistent HA > left prior to being seen  Today's visit is scheduled as a 6 mo visit ROS:   She reports she had a constant headache top of her head for 2 weeks, definitely worsened by fluorescent/bright lights, but never gone for 2 weeks She reports hx of light triggered HAs in the past but never so protracted. Did though finally start to wane and finally resolved  She has "every so often" palpitations/AFib, lasting 3-67minutes, make her feel a little lightheaded, no near syncope or syncope  She has Eliquis  also listed ASA 81mg  daily and the PRN naproxen  and Bayer back/arthritis She tells me that many years ago she was told not to use Tylenol  because it could hurt her liver She denies any personal hx of liver disease but was in her family  No CP, SOB  Arrhythmia/AAD hx AFib ablation 12/07/22 Amiodarone  stopped Nov 2024  Studies Reviewed: Aaron Aas    EKG not done today  12/07/22: EPS/ablation CONCLUSIONS: 1. Sinus rhythm upon presentation.   2. Successful electrical isolation and anatomical encircling of all four pulmonary veins with radiofrequency current. 3. No inducible arrhythmias following ablation both on and off of dobutamine  4. No early apparent complications.   11/09/2016:  TTE Study Conclusions  - Left ventricle: The cavity size was normal. Systolic function was    vigorous. The estimated ejection fraction was in the range of 65%    to 70%. Wall motion was normal; there were no regional wall    motion abnormalities. Doppler parameters are consistent with    abnormal left ventricular relaxation (grade 1 diastolic    dysfunction).  - Mitral valve: Calcified annulus.     Risk Assessment/Calculations:    Physical Exam:   VS:  LMP 06/20/1986    Wt Readings from Last 3 Encounters:  09/29/23 171 lb (77.6 kg)  09/29/23 171 lb 6.4 oz (77.7 kg)  09/20/23 171 lb 6 oz (77.7 kg)    GEN: Well nourished, well developed in no acute distress NECK: No JVD; No carotid bruits CARDIAC: RRR, no murmurs, rubs, gallops RESPIRATORY:  CTA b/l without rales, wheezing or rhonchi  ABDOMEN: Soft, non-tender, non-distended EXTREMITIES: No edema; No deformity   ASSESSMENT AND PLAN: .    paroxysmal AFib CHA2DS2Vasc is 5, on Eliquis , appropriately dosed low burden by symptoms, very acceptable/tolerable to her  Long discussion on risk of bleeding with Eliquis  and the ASA containing products/NSAIDs Advised not to use Naproxen , she reports only occasional use of the Bayer arthritis only when her back is really hurting. I have asked that she clarify with her PMD unclear instruction not to use Tylenol , this would be our preferred antiinflammatory/pain option for her  HTN Looks good  Secondary hypercoagulable state 2/2 AFib   Dispo: back again in  52mo, sooner if needed  Signed, Debbie Fails, PA-C

## 2023-10-23 ENCOUNTER — Ambulatory Visit: Payer: 59 | Attending: Physician Assistant | Admitting: Physician Assistant

## 2023-10-23 ENCOUNTER — Encounter: Payer: Self-pay | Admitting: Physician Assistant

## 2023-10-23 VITALS — BP 136/78 | HR 71 | Ht 62.0 in | Wt 170.2 lb

## 2023-10-23 DIAGNOSIS — D6869 Other thrombophilia: Secondary | ICD-10-CM

## 2023-10-23 DIAGNOSIS — I10 Essential (primary) hypertension: Secondary | ICD-10-CM

## 2023-10-23 DIAGNOSIS — I480 Paroxysmal atrial fibrillation: Secondary | ICD-10-CM | POA: Diagnosis not present

## 2023-10-23 NOTE — Patient Instructions (Signed)
 Medication Instructions:   Your physician recommends that you continue on your current medications as directed. Please refer to the Current Medication list given to you today.   *If you need a refill on your cardiac medications before your next appointment, please call your pharmacy*   Lab Work: NONE ORDERED  TODAY    If you have labs (blood work) drawn today and your tests are completely normal, you will receive your results only by: MyChart Message (if you have MyChart) OR A paper copy in the mail If you have any lab test that is abnormal or we need to change your treatment, we will call you to review the results.   Testing/Procedures: NONE ORDERED  TODAY    Follow-Up: At [facility], you and your health needs are our priority.  As part of our continuing mission to provide you with exceptional heart care, our providers are all part of one team.  This team includes your primary Cardiologist (physician) and Advanced Practice Providers or APPs (Physician Assistants and Nurse Practitioners) who all work together to provide you with the care you need, when you need it.  Your next appointment:    6 month(s)   Provider:    Mertha Abrahams, PA-C    We recommend signing up for the patient portal called "MyChart".  Sign up information is provided on this After Visit Summary.  MyChart is used to connect with patients for Virtual Visits (Telemedicine).  Patients are able to view lab/test results, encounter notes, upcoming appointments, etc.  Non-urgent messages can be sent to your provider as well.   To learn more about what you can do with MyChart, go to [email or web address].   Other Instructions

## 2023-11-10 ENCOUNTER — Ambulatory Visit: Payer: 59 | Admitting: Nurse Practitioner

## 2023-11-15 ENCOUNTER — Other Ambulatory Visit: Payer: Self-pay | Admitting: Internal Medicine

## 2023-11-15 DIAGNOSIS — I10 Essential (primary) hypertension: Secondary | ICD-10-CM

## 2023-11-15 NOTE — Telephone Encounter (Signed)
 Medication sent to pharmacy

## 2023-11-16 ENCOUNTER — Other Ambulatory Visit: Payer: Self-pay | Admitting: Internal Medicine

## 2023-11-16 ENCOUNTER — Encounter: Admitting: Internal Medicine

## 2023-11-16 NOTE — Telephone Encounter (Signed)
 Medication sent to pharmacy

## 2023-12-07 ENCOUNTER — Ambulatory Visit: Admitting: Nurse Practitioner

## 2024-01-11 ENCOUNTER — Other Ambulatory Visit: Payer: Self-pay

## 2024-01-11 MED ORDER — ATORVASTATIN CALCIUM 40 MG PO TABS: 40.0000 mg | ORAL_TABLET | Freq: Every day | ORAL | 3 refills | Status: AC

## 2024-01-17 ENCOUNTER — Telehealth: Payer: Self-pay | Admitting: Physician Assistant

## 2024-01-17 DIAGNOSIS — I495 Sick sinus syndrome: Secondary | ICD-10-CM

## 2024-01-17 MED ORDER — APIXABAN 5 MG PO TABS: 5.0000 mg | ORAL_TABLET | Freq: Two times a day (BID) | ORAL | 5 refills | Status: AC

## 2024-01-17 NOTE — Telephone Encounter (Signed)
*  STAT* If patient is at the pharmacy, call can be transferred to refill team.   1. Which medications need to be refilled? (please list name of each medication and dose if known)   ELIQUIS  5 MG TABS tablet    2. Which pharmacy/location (including street and city if local pharmacy) is medication to be sent to?  [facility] [place], [place] - [address]      3. Do they need a 30 day or 90 day supply? 90 day

## 2024-01-17 NOTE — Telephone Encounter (Signed)
 Prescription refill request for Eliquis  received. Indication: PAF Last office visit: 10/23/23 Scr: 0.78 epic 09/29/23 Age: 78 Weight: 77kg

## 2024-02-17 ENCOUNTER — Other Ambulatory Visit: Payer: Self-pay | Admitting: General Practice

## 2024-02-20 ENCOUNTER — Other Ambulatory Visit: Payer: Self-pay | Admitting: *Deleted

## 2024-02-20 MED ORDER — OMEPRAZOLE 40 MG PO CPDR: 40.0000 mg | DELAYED_RELEASE_CAPSULE | Freq: Two times a day (BID) | ORAL | 0 refills | Status: AC

## 2024-02-20 NOTE — Telephone Encounter (Signed)
 Received fax refill request from pt's pharmacy (omeprazole ) Pt overdue for visit Attempted to contact pt with an appt  No answer Message left on recorder

## 2024-05-07 ENCOUNTER — Ambulatory Visit

## 2024-06-06 ENCOUNTER — Ambulatory Visit
